# Patient Record
Sex: Female | Born: 1970 | Race: Black or African American | Hispanic: No | Smoking: Never smoker
Health system: Southern US, Community
[De-identification: ages and names within clinical notes are randomized; demographics above are authoritative.]

## PROBLEM LIST (undated history)

## (undated) DIAGNOSIS — T7840XA Allergy, unspecified, initial encounter: Secondary | ICD-10-CM

## (undated) DIAGNOSIS — E119 Type 2 diabetes mellitus without complications: Secondary | ICD-10-CM

## (undated) DIAGNOSIS — M1990 Unspecified osteoarthritis, unspecified site: Secondary | ICD-10-CM

## (undated) DIAGNOSIS — I10 Essential (primary) hypertension: Secondary | ICD-10-CM

## (undated) DIAGNOSIS — IMO0002 Reserved for concepts with insufficient information to code with codable children: Secondary | ICD-10-CM

## (undated) DIAGNOSIS — F329 Major depressive disorder, single episode, unspecified: Secondary | ICD-10-CM

## (undated) DIAGNOSIS — G43909 Migraine, unspecified, not intractable, without status migrainosus: Secondary | ICD-10-CM

## (undated) DIAGNOSIS — F32A Depression, unspecified: Secondary | ICD-10-CM

## (undated) HISTORY — DX: Depression, unspecified: F32.A

## (undated) HISTORY — DX: Migraine, unspecified, not intractable, without status migrainosus: G43.909

## (undated) HISTORY — DX: Major depressive disorder, single episode, unspecified: F32.9

## (undated) HISTORY — DX: Unspecified osteoarthritis, unspecified site: M19.90

## (undated) HISTORY — DX: Allergy, unspecified, initial encounter: T78.40XA

## (undated) HISTORY — DX: Reserved for concepts with insufficient information to code with codable children: IMO0002

---
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
## 1997-10-13 ENCOUNTER — Emergency Department (HOSPITAL_COMMUNITY): Admission: EM | Admit: 1997-10-13 | Discharge: 1997-10-13 | Payer: Self-pay | Admitting: Emergency Medicine

## 1998-04-24 ENCOUNTER — Emergency Department (HOSPITAL_COMMUNITY): Admission: EM | Admit: 1998-04-24 | Discharge: 1998-04-24 | Payer: Self-pay | Admitting: Emergency Medicine

## 1998-08-23 ENCOUNTER — Emergency Department (HOSPITAL_COMMUNITY): Admission: EM | Admit: 1998-08-23 | Discharge: 1998-08-23 | Payer: Self-pay | Admitting: Emergency Medicine

## 1999-01-01 ENCOUNTER — Emergency Department (HOSPITAL_COMMUNITY): Admission: EM | Admit: 1999-01-01 | Discharge: 1999-01-01 | Payer: Self-pay | Admitting: Emergency Medicine

## 1999-01-30 ENCOUNTER — Emergency Department (HOSPITAL_COMMUNITY): Admission: EM | Admit: 1999-01-30 | Discharge: 1999-01-30 | Payer: Self-pay

## 1999-04-12 ENCOUNTER — Emergency Department (HOSPITAL_COMMUNITY): Admission: EM | Admit: 1999-04-12 | Discharge: 1999-04-12 | Payer: Self-pay | Admitting: Emergency Medicine

## 1999-04-15 ENCOUNTER — Emergency Department (HOSPITAL_COMMUNITY): Admission: EM | Admit: 1999-04-15 | Discharge: 1999-04-15 | Payer: Self-pay | Admitting: Emergency Medicine

## 1999-05-02 ENCOUNTER — Emergency Department (HOSPITAL_COMMUNITY): Admission: EM | Admit: 1999-05-02 | Discharge: 1999-05-02 | Payer: Self-pay

## 1999-06-04 ENCOUNTER — Emergency Department (HOSPITAL_COMMUNITY): Admission: EM | Admit: 1999-06-04 | Discharge: 1999-06-04 | Payer: Self-pay | Admitting: Emergency Medicine

## 1999-06-04 ENCOUNTER — Encounter: Payer: Self-pay | Admitting: Emergency Medicine

## 1999-08-17 ENCOUNTER — Emergency Department (HOSPITAL_COMMUNITY): Admission: EM | Admit: 1999-08-17 | Discharge: 1999-08-17 | Payer: Self-pay | Admitting: Emergency Medicine

## 1999-09-07 ENCOUNTER — Emergency Department (HOSPITAL_COMMUNITY): Admission: EM | Admit: 1999-09-07 | Discharge: 1999-09-07 | Payer: Self-pay | Admitting: *Deleted

## 1999-11-26 ENCOUNTER — Encounter: Payer: Self-pay | Admitting: Emergency Medicine

## 1999-11-26 ENCOUNTER — Emergency Department (HOSPITAL_COMMUNITY): Admission: EM | Admit: 1999-11-26 | Discharge: 1999-11-26 | Payer: Self-pay | Admitting: *Deleted

## 2000-01-03 ENCOUNTER — Emergency Department (HOSPITAL_COMMUNITY): Admission: EM | Admit: 2000-01-03 | Discharge: 2000-01-04 | Payer: Self-pay | Admitting: Emergency Medicine

## 2000-01-03 ENCOUNTER — Encounter: Payer: Self-pay | Admitting: Emergency Medicine

## 2000-03-03 ENCOUNTER — Emergency Department (HOSPITAL_COMMUNITY): Admission: EM | Admit: 2000-03-03 | Discharge: 2000-03-03 | Payer: Self-pay | Admitting: Emergency Medicine

## 2000-06-21 ENCOUNTER — Emergency Department (HOSPITAL_COMMUNITY): Admission: EM | Admit: 2000-06-21 | Discharge: 2000-06-21 | Payer: Self-pay | Admitting: Emergency Medicine

## 2000-10-04 ENCOUNTER — Emergency Department (HOSPITAL_COMMUNITY): Admission: EM | Admit: 2000-10-04 | Discharge: 2000-10-04 | Payer: Self-pay | Admitting: Emergency Medicine

## 2000-10-09 ENCOUNTER — Emergency Department (HOSPITAL_COMMUNITY): Admission: EM | Admit: 2000-10-09 | Discharge: 2000-10-09 | Payer: Self-pay | Admitting: Emergency Medicine

## 2000-10-09 ENCOUNTER — Encounter: Payer: Self-pay | Admitting: Emergency Medicine

## 2000-10-29 ENCOUNTER — Emergency Department (HOSPITAL_COMMUNITY): Admission: EM | Admit: 2000-10-29 | Discharge: 2000-10-29 | Payer: Self-pay | Admitting: Emergency Medicine

## 2001-01-20 ENCOUNTER — Emergency Department (HOSPITAL_COMMUNITY): Admission: EM | Admit: 2001-01-20 | Discharge: 2001-01-20 | Payer: Self-pay | Admitting: Emergency Medicine

## 2001-03-23 ENCOUNTER — Emergency Department (HOSPITAL_COMMUNITY): Admission: EM | Admit: 2001-03-23 | Discharge: 2001-03-23 | Payer: Self-pay | Admitting: *Deleted

## 2001-04-10 ENCOUNTER — Emergency Department (HOSPITAL_COMMUNITY): Admission: EM | Admit: 2001-04-10 | Discharge: 2001-04-10 | Payer: Self-pay | Admitting: Emergency Medicine

## 2001-04-10 ENCOUNTER — Encounter: Payer: Self-pay | Admitting: Emergency Medicine

## 2001-05-21 ENCOUNTER — Emergency Department (HOSPITAL_COMMUNITY): Admission: EM | Admit: 2001-05-21 | Discharge: 2001-05-22 | Payer: Self-pay | Admitting: Emergency Medicine

## 2001-06-08 ENCOUNTER — Emergency Department (HOSPITAL_COMMUNITY): Admission: EM | Admit: 2001-06-08 | Discharge: 2001-06-08 | Payer: Self-pay | Admitting: Emergency Medicine

## 2001-08-09 ENCOUNTER — Emergency Department (HOSPITAL_COMMUNITY): Admission: EM | Admit: 2001-08-09 | Discharge: 2001-08-09 | Payer: Self-pay | Admitting: Emergency Medicine

## 2001-10-06 ENCOUNTER — Emergency Department (HOSPITAL_COMMUNITY): Admission: EM | Admit: 2001-10-06 | Discharge: 2001-10-06 | Payer: Self-pay | Admitting: Unknown Physician Specialty

## 2001-10-06 ENCOUNTER — Encounter: Payer: Self-pay | Admitting: Emergency Medicine

## 2001-12-21 ENCOUNTER — Emergency Department (HOSPITAL_COMMUNITY): Admission: EM | Admit: 2001-12-21 | Discharge: 2001-12-21 | Payer: Self-pay | Admitting: Emergency Medicine

## 2002-03-15 ENCOUNTER — Emergency Department (HOSPITAL_COMMUNITY): Admission: EM | Admit: 2002-03-15 | Discharge: 2002-03-15 | Payer: Self-pay | Admitting: Emergency Medicine

## 2002-10-01 ENCOUNTER — Encounter: Payer: Self-pay | Admitting: Emergency Medicine

## 2002-10-01 ENCOUNTER — Emergency Department (HOSPITAL_COMMUNITY): Admission: EM | Admit: 2002-10-01 | Discharge: 2002-10-01 | Payer: Self-pay | Admitting: Emergency Medicine

## 2002-11-08 ENCOUNTER — Encounter: Admission: RE | Admit: 2002-11-08 | Discharge: 2003-02-06 | Payer: Self-pay | Admitting: *Deleted

## 2003-01-10 ENCOUNTER — Emergency Department (HOSPITAL_COMMUNITY): Admission: EM | Admit: 2003-01-10 | Discharge: 2003-01-10 | Payer: Self-pay | Admitting: Emergency Medicine

## 2003-01-28 LAB — HM PAP SMEAR

## 2003-07-26 ENCOUNTER — Emergency Department (HOSPITAL_COMMUNITY): Admission: EM | Admit: 2003-07-26 | Discharge: 2003-07-26 | Payer: Self-pay | Admitting: Emergency Medicine

## 2003-08-08 ENCOUNTER — Emergency Department (HOSPITAL_COMMUNITY): Admission: EM | Admit: 2003-08-08 | Discharge: 2003-08-08 | Payer: Self-pay | Admitting: Emergency Medicine

## 2003-10-04 ENCOUNTER — Emergency Department (HOSPITAL_COMMUNITY): Admission: EM | Admit: 2003-10-04 | Discharge: 2003-10-04 | Payer: Self-pay | Admitting: Emergency Medicine

## 2004-01-01 ENCOUNTER — Emergency Department (HOSPITAL_COMMUNITY): Admission: EM | Admit: 2004-01-01 | Discharge: 2004-01-01 | Payer: Self-pay | Admitting: Emergency Medicine

## 2004-01-10 ENCOUNTER — Emergency Department (HOSPITAL_COMMUNITY): Admission: EM | Admit: 2004-01-10 | Discharge: 2004-01-10 | Payer: Self-pay | Admitting: Emergency Medicine

## 2004-04-03 ENCOUNTER — Emergency Department (HOSPITAL_COMMUNITY): Admission: EM | Admit: 2004-04-03 | Discharge: 2004-04-03 | Payer: Self-pay | Admitting: Emergency Medicine

## 2004-07-15 ENCOUNTER — Emergency Department (HOSPITAL_COMMUNITY): Admission: EM | Admit: 2004-07-15 | Discharge: 2004-07-15 | Payer: Self-pay | Admitting: Emergency Medicine

## 2004-08-15 ENCOUNTER — Emergency Department (HOSPITAL_COMMUNITY): Admission: EM | Admit: 2004-08-15 | Discharge: 2004-08-15 | Payer: Self-pay | Admitting: Emergency Medicine

## 2004-11-25 ENCOUNTER — Emergency Department (HOSPITAL_COMMUNITY): Admission: EM | Admit: 2004-11-25 | Discharge: 2004-11-25 | Payer: Self-pay | Admitting: *Deleted

## 2005-01-27 LAB — HM DIABETES EYE EXAM

## 2005-02-18 ENCOUNTER — Emergency Department (HOSPITAL_COMMUNITY): Admission: EM | Admit: 2005-02-18 | Discharge: 2005-02-18 | Payer: Self-pay | Admitting: Emergency Medicine

## 2005-02-24 ENCOUNTER — Emergency Department (HOSPITAL_COMMUNITY): Admission: EM | Admit: 2005-02-24 | Discharge: 2005-02-24 | Payer: Self-pay | Admitting: Emergency Medicine

## 2005-06-19 ENCOUNTER — Emergency Department (HOSPITAL_COMMUNITY): Admission: EM | Admit: 2005-06-19 | Discharge: 2005-06-19 | Payer: Self-pay | Admitting: Emergency Medicine

## 2005-08-31 ENCOUNTER — Emergency Department (HOSPITAL_COMMUNITY): Admission: EM | Admit: 2005-08-31 | Discharge: 2005-08-31 | Payer: Self-pay | Admitting: Emergency Medicine

## 2006-03-19 ENCOUNTER — Emergency Department (HOSPITAL_COMMUNITY): Admission: EM | Admit: 2006-03-19 | Discharge: 2006-03-19 | Payer: Self-pay | Admitting: Emergency Medicine

## 2007-06-08 ENCOUNTER — Emergency Department (HOSPITAL_COMMUNITY): Admission: EM | Admit: 2007-06-08 | Discharge: 2007-06-08 | Payer: Self-pay | Admitting: Emergency Medicine

## 2007-10-15 ENCOUNTER — Emergency Department (HOSPITAL_COMMUNITY): Admission: EM | Admit: 2007-10-15 | Discharge: 2007-10-15 | Payer: Self-pay | Admitting: Emergency Medicine

## 2008-01-18 ENCOUNTER — Emergency Department (HOSPITAL_COMMUNITY): Admission: EM | Admit: 2008-01-18 | Discharge: 2008-01-18 | Payer: Self-pay | Admitting: Emergency Medicine

## 2008-01-20 ENCOUNTER — Emergency Department (HOSPITAL_COMMUNITY): Admission: EM | Admit: 2008-01-20 | Discharge: 2008-01-21 | Payer: Self-pay | Admitting: Emergency Medicine

## 2008-02-14 ENCOUNTER — Emergency Department (HOSPITAL_COMMUNITY): Admission: EM | Admit: 2008-02-14 | Discharge: 2008-02-14 | Payer: Self-pay | Admitting: Emergency Medicine

## 2008-03-13 ENCOUNTER — Emergency Department (HOSPITAL_COMMUNITY): Admission: EM | Admit: 2008-03-13 | Discharge: 2008-03-13 | Payer: Self-pay | Admitting: Emergency Medicine

## 2008-04-06 ENCOUNTER — Emergency Department (HOSPITAL_COMMUNITY): Admission: EM | Admit: 2008-04-06 | Discharge: 2008-04-06 | Payer: Self-pay | Admitting: Emergency Medicine

## 2008-06-14 ENCOUNTER — Emergency Department (HOSPITAL_COMMUNITY): Admission: EM | Admit: 2008-06-14 | Discharge: 2008-06-14 | Payer: Self-pay | Admitting: Emergency Medicine

## 2008-07-27 ENCOUNTER — Emergency Department (HOSPITAL_COMMUNITY): Admission: EM | Admit: 2008-07-27 | Discharge: 2008-07-27 | Payer: Self-pay | Admitting: Emergency Medicine

## 2008-08-16 ENCOUNTER — Emergency Department (HOSPITAL_COMMUNITY): Admission: EM | Admit: 2008-08-16 | Discharge: 2008-08-17 | Payer: Self-pay | Admitting: Emergency Medicine

## 2008-12-06 ENCOUNTER — Encounter: Admission: RE | Admit: 2008-12-06 | Discharge: 2009-01-24 | Payer: Self-pay | Admitting: Internal Medicine

## 2009-04-05 ENCOUNTER — Emergency Department (HOSPITAL_COMMUNITY): Admission: EM | Admit: 2009-04-05 | Discharge: 2009-04-05 | Payer: Self-pay | Admitting: Emergency Medicine

## 2009-05-24 ENCOUNTER — Ambulatory Visit: Payer: Self-pay | Admitting: Family Medicine

## 2009-05-24 ENCOUNTER — Observation Stay (HOSPITAL_COMMUNITY): Admission: EM | Admit: 2009-05-24 | Discharge: 2009-05-25 | Payer: Self-pay | Admitting: Emergency Medicine

## 2009-05-24 ENCOUNTER — Encounter: Payer: Self-pay | Admitting: Family Medicine

## 2009-05-24 ENCOUNTER — Ambulatory Visit: Payer: Self-pay | Admitting: Vascular Surgery

## 2009-08-27 ENCOUNTER — Emergency Department (HOSPITAL_COMMUNITY): Admission: EM | Admit: 2009-08-27 | Discharge: 2009-08-27 | Payer: Self-pay | Admitting: Emergency Medicine

## 2010-04-16 LAB — POCT CARDIAC MARKERS
CKMB, poc: <1 ng/mL — ABNORMAL LOW (ref 1.0–8.0)
Myoglobin, poc: 90.6 ng/mL (ref 12–200)
Troponin i, poc: <0.05 ng/mL (ref 0.00–0.09)

## 2010-04-16 LAB — BASIC METABOLIC PANEL
BUN: 7 mg/dL (ref 6–23)
CO2: 23 mEq/L (ref 19–32)
CO2: 24 mEq/L (ref 19–32)
Calcium: 8.7 mg/dL (ref 8.4–10.5)
Chloride: 109 mEq/L (ref 96–112)
GFR calc Af Amer: >60 mL/min (ref >60)
Glucose, Bld: 102 mg/dL — ABNORMAL HIGH (ref 70–99)
Potassium: 3.5 mEq/L (ref 3.5–5.1)
Sodium: 137 mEq/L (ref 135–145)

## 2010-04-16 LAB — CBC
HCT: 31.9 % — ABNORMAL LOW (ref 36.0–46.0)
MCHC: 34.5 g/dL (ref 30.0–36.0)
MCV: 75.9 fL — ABNORMAL LOW (ref 78.0–100.0)
MCV: 76.2 fL — ABNORMAL LOW (ref 78.0–100.0)
Platelets: 248 10*3/uL (ref 150–400)
Platelets: 271 10*3/uL (ref 150–400)
RBC: 4.38 MIL/uL (ref 3.87–5.11)
RDW: 16.8 % — ABNORMAL HIGH (ref 11.5–15.5)
RDW: 17.1 % — ABNORMAL HIGH (ref 11.5–15.5)

## 2010-04-16 LAB — CARDIAC PANEL(CRET KIN+CKTOT+MB+TROPI)
CK, MB: 0.7 ng/mL (ref 0.3–4.0)
Relative Index: 0.7 (ref 0.0–2.5)
Total CK: 105 U/L (ref 7–177)
Total CK: 107 U/L (ref 7–177)
Troponin I: 0.01 ng/mL (ref 0.00–0.06)

## 2010-04-16 LAB — LIPID PANEL
Cholesterol: 210 mg/dL — ABNORMAL HIGH (ref 0–200)
LDL Cholesterol: 143 mg/dL — ABNORMAL HIGH (ref 0–99)
Total CHOL/HDL Ratio: 4.4 RATIO

## 2010-04-16 LAB — DIFFERENTIAL
Basophils Absolute: 0.1 10*3/uL (ref 0.0–0.1)
Eosinophils Absolute: 0.1 10*3/uL (ref 0.0–0.7)
Eosinophils Relative: 2 % (ref 0–5)
Monocytes Absolute: 0.2 10*3/uL (ref 0.1–1.0)

## 2010-04-16 LAB — CK TOTAL AND CKMB (NOT AT ARMC)
CK, MB: 0.8 ng/mL (ref 0.3–4.0)
Relative Index: 0.7 (ref 0.0–2.5)

## 2010-04-16 LAB — POCT PREGNANCY, URINE: Preg Test, Ur: NEGATIVE

## 2010-04-22 LAB — URINALYSIS, ROUTINE W REFLEX MICROSCOPIC
Nitrite: NEGATIVE
Protein, ur: NEGATIVE mg/dL
Specific Gravity, Urine: 1.034 — ABNORMAL HIGH (ref 1.005–1.030)
Urobilinogen, UA: 0.2 mg/dL (ref 0.0–1.0)

## 2010-04-22 LAB — POCT PREGNANCY, URINE: Preg Test, Ur: NEGATIVE

## 2010-04-22 LAB — GLUCOSE, CAPILLARY: Glucose-Capillary: 82 mg/dL (ref 70–99)

## 2010-04-27 ENCOUNTER — Emergency Department (HOSPITAL_COMMUNITY)
Admission: EM | Admit: 2010-04-27 | Discharge: 2010-04-27 | Disposition: A | Discharge status: Home / Self Care | Payer: Self-pay | Attending: Emergency Medicine | Admitting: Emergency Medicine

## 2010-04-27 DIAGNOSIS — R112 Nausea with vomiting, unspecified: Secondary | ICD-10-CM | POA: Insufficient documentation

## 2010-04-27 DIAGNOSIS — R10816 Epigastric abdominal tenderness: Secondary | ICD-10-CM | POA: Insufficient documentation

## 2010-04-27 DIAGNOSIS — I10 Essential (primary) hypertension: Secondary | ICD-10-CM | POA: Insufficient documentation

## 2010-04-27 DIAGNOSIS — R109 Unspecified abdominal pain: Secondary | ICD-10-CM | POA: Insufficient documentation

## 2010-04-27 DIAGNOSIS — E119 Type 2 diabetes mellitus without complications: Secondary | ICD-10-CM | POA: Insufficient documentation

## 2010-04-27 DIAGNOSIS — R197 Diarrhea, unspecified: Secondary | ICD-10-CM | POA: Insufficient documentation

## 2010-04-27 LAB — GLUCOSE, CAPILLARY: Glucose-Capillary: 85 mg/dL (ref 70–99)

## 2010-05-05 LAB — COMPREHENSIVE METABOLIC PANEL
ALT: 15 U/L (ref 0–35)
AST: 26 U/L (ref 0–37)
Albumin: 3.8 g/dL (ref 3.5–5.2)
Alkaline Phosphatase: 60 U/L (ref 39–117)
Chloride: 109 mEq/L (ref 96–112)
GFR calc Af Amer: >60 mL/min (ref >60)
Potassium: 3.8 mEq/L (ref 3.5–5.1)
Total Bilirubin: 0.6 mg/dL (ref 0.3–1.2)

## 2010-05-05 LAB — URINALYSIS, ROUTINE W REFLEX MICROSCOPIC
Ketones, ur: NEGATIVE mg/dL
Nitrite: NEGATIVE
Urobilinogen, UA: 0.2 mg/dL (ref 0.0–1.0)
pH: 5.5 (ref 5.0–8.0)

## 2010-05-05 LAB — DIFFERENTIAL
Basophils Absolute: 0.1 10*3/uL (ref 0.0–0.1)
Basophils Relative: 1 % (ref 0–1)
Eosinophils Relative: 2 % (ref 0–5)
Monocytes Absolute: 0.5 10*3/uL (ref 0.1–1.0)

## 2010-05-05 LAB — RAPID STREP SCREEN (MED CTR MEBANE ONLY): Streptococcus, Group A Screen (Direct): NEGATIVE

## 2010-05-05 LAB — CBC
Platelets: 324 10*3/uL (ref 150–400)
WBC: 9 10*3/uL (ref 4.0–10.5)

## 2010-05-05 LAB — GLUCOSE, CAPILLARY

## 2010-05-14 LAB — GLUCOSE, CAPILLARY: Glucose-Capillary: 71 mg/dL (ref 70–99)

## 2010-09-11 ENCOUNTER — Emergency Department (HOSPITAL_COMMUNITY)
Admission: EM | Admit: 2010-09-11 | Discharge: 2010-09-11 | Disposition: A | Discharge status: Home / Self Care | Payer: Self-pay | Attending: Emergency Medicine | Admitting: Emergency Medicine

## 2010-09-11 ENCOUNTER — Emergency Department (HOSPITAL_COMMUNITY): Payer: Self-pay

## 2010-09-11 DIAGNOSIS — X500XXA Overexertion from strenuous movement or load, initial encounter: Secondary | ICD-10-CM | POA: Insufficient documentation

## 2010-09-11 DIAGNOSIS — M25579 Pain in unspecified ankle and joints of unspecified foot: Secondary | ICD-10-CM | POA: Insufficient documentation

## 2010-09-11 DIAGNOSIS — S93409A Sprain of unspecified ligament of unspecified ankle, initial encounter: Secondary | ICD-10-CM | POA: Insufficient documentation

## 2010-11-01 LAB — CBC
HCT: 39.1 % (ref 36.0–46.0)
Hemoglobin: 12.9 g/dL (ref 12.0–15.0)
MCHC: 32.9 g/dL (ref 30.0–36.0)
MCHC: 32.9 g/dL (ref 30.0–36.0)
Platelets: 339 10*3/uL (ref 150–400)
RBC: 5.23 MIL/uL — ABNORMAL HIGH (ref 3.87–5.11)
RDW: 17.8 % — ABNORMAL HIGH (ref 11.5–15.5)
RDW: 17.8 % — ABNORMAL HIGH (ref 11.5–15.5)

## 2010-11-01 LAB — URINALYSIS, ROUTINE W REFLEX MICROSCOPIC
Bilirubin Urine: NEGATIVE
Glucose, UA: >1000 mg/dL — AB
Glucose, UA: >1000 mg/dL — AB
Hgb urine dipstick: NEGATIVE
Ketones, ur: 40 mg/dL — AB
Leukocytes, UA: NEGATIVE
Leukocytes, UA: NEGATIVE
Nitrite: NEGATIVE
Protein, ur: NEGATIVE mg/dL
Protein, ur: NEGATIVE mg/dL

## 2010-11-01 LAB — BASIC METABOLIC PANEL
BUN: 9 mg/dL (ref 6–23)
CO2: 20 mEq/L (ref 19–32)
Calcium: 10 mg/dL (ref 8.4–10.5)
Creatinine, Ser: 0.95 mg/dL (ref 0.4–1.2)
GFR calc Af Amer: >60 mL/min (ref >60)
Glucose, Bld: 499 mg/dL — ABNORMAL HIGH (ref 70–99)

## 2010-11-01 LAB — DIFFERENTIAL
Basophils Absolute: 0.1 10*3/uL (ref 0.0–0.1)
Basophils Relative: 1 % (ref 0–1)
Basophils Relative: 2 % — ABNORMAL HIGH (ref 0–1)
Eosinophils Absolute: 0.1 10*3/uL (ref 0.0–0.7)
Eosinophils Absolute: 0.2 10*3/uL (ref 0.0–0.7)
Monocytes Relative: 8 % (ref 3–12)
Neutro Abs: 4.6 10*3/uL (ref 1.7–7.7)
Neutro Abs: 4.8 10*3/uL (ref 1.7–7.7)
Neutrophils Relative %: 58 % (ref 43–77)
Neutrophils Relative %: 61 % (ref 43–77)

## 2010-11-01 LAB — POCT PREGNANCY, URINE: Preg Test, Ur: NEGATIVE

## 2010-11-01 LAB — COMPREHENSIVE METABOLIC PANEL
ALT: 16 U/L (ref 0–35)
Alkaline Phosphatase: 62 U/L (ref 39–117)
BUN: 8 mg/dL (ref 6–23)
CO2: 21 mEq/L (ref 19–32)
Calcium: 8.8 mg/dL (ref 8.4–10.5)
GFR calc non Af Amer: >60 mL/min (ref >60)
Glucose, Bld: 333 mg/dL — ABNORMAL HIGH (ref 70–99)
Potassium: 3.5 mEq/L (ref 3.5–5.1)
Total Protein: 7.1 g/dL (ref 6.0–8.3)

## 2010-11-01 LAB — URINE MICROSCOPIC-ADD ON

## 2010-11-01 LAB — GLUCOSE, CAPILLARY

## 2010-11-01 LAB — LIPASE, BLOOD: Lipase: 38 U/L (ref 11–59)

## 2010-11-01 LAB — KETONES, QUALITATIVE: Acetone, Bld: NEGATIVE

## 2011-02-12 ENCOUNTER — Ambulatory Visit: Payer: Self-pay

## 2011-02-12 DIAGNOSIS — Z23 Encounter for immunization: Secondary | ICD-10-CM

## 2011-02-28 LAB — HM DIABETES FOOT EXAM

## 2011-09-17 ENCOUNTER — Other Ambulatory Visit: Payer: Self-pay | Admitting: Family Medicine

## 2011-09-17 NOTE — Telephone Encounter (Signed)
Chart pulled [id number]

## 2011-12-11 ENCOUNTER — Emergency Department (HOSPITAL_COMMUNITY)
Admission: EM | Admit: 2011-12-11 | Discharge: 2011-12-11 | Disposition: A | Discharge status: Home / Self Care | Payer: Self-pay | Attending: Emergency Medicine | Admitting: Emergency Medicine

## 2011-12-11 ENCOUNTER — Encounter (HOSPITAL_COMMUNITY): Payer: Self-pay | Admitting: *Deleted

## 2011-12-11 DIAGNOSIS — E119 Type 2 diabetes mellitus without complications: Secondary | ICD-10-CM | POA: Insufficient documentation

## 2011-12-11 DIAGNOSIS — H6690 Otitis media, unspecified, unspecified ear: Secondary | ICD-10-CM | POA: Insufficient documentation

## 2011-12-11 DIAGNOSIS — I10 Essential (primary) hypertension: Secondary | ICD-10-CM | POA: Insufficient documentation

## 2011-12-11 DIAGNOSIS — Z79899 Other long term (current) drug therapy: Secondary | ICD-10-CM | POA: Insufficient documentation

## 2011-12-11 DIAGNOSIS — H9319 Tinnitus, unspecified ear: Secondary | ICD-10-CM | POA: Insufficient documentation

## 2011-12-11 HISTORY — DX: Essential (primary) hypertension: I10

## 2011-12-11 HISTORY — DX: Type 2 diabetes mellitus without complications: E11.9

## 2011-12-11 MED ORDER — TRAMADOL HCL 50 MG PO TABS: 50.0000 mg | ORAL_TABLET | Freq: Four times a day (QID) | ORAL | Status: DC | PRN

## 2011-12-11 MED ORDER — KETOROLAC TROMETHAMINE 60 MG/2ML IM SOLN
60.0000 mg | Freq: Once | INTRAMUSCULAR | Status: AC
  Administered 2011-12-11: 60 mg via INTRAMUSCULAR
  Filled 2011-12-11: qty 2

## 2011-12-11 MED ORDER — SULFAMETHOXAZOLE-TRIMETHOPRIM 800-160 MG PO TABS: 1.0000 | ORAL_TABLET | Freq: Two times a day (BID) | ORAL | Status: DC

## 2011-12-11 NOTE — ED Provider Notes (Signed)
History     CSN: [id number]  Arrival date & time 12/11/11  1127   First MD Initiated Contact with Patient 12/11/11 1157      No chief complaint on file.   (Consider location/radiation/quality/duration/timing/severity/associated sxs/prior treatment) HPI Comments: Patient presents with otalgia X 3 days. She states that pain is 8/10 without radiation or transmission. 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 Denies fever or chills. Denies ear drainage but reports tinnitus. Denies cough or congestion. Denies dental issues.  The history is provided by the patient. No language interpreter was used.    Past Medical History  Diagnosis Date  . Diabetes mellitus without complication   . Hypertension     Past Surgical History  Procedure Date  . Cesarean section   . Tubal ligation     History reviewed. No pertinent family history.  History  Substance Use Topics  . Smoking status: Never Smoker   . Smokeless tobacco: Never Used  . Alcohol Use: No    OB History    Grav Para Term Preterm Abortions TAB SAB Ect Mult Living                  Review of Systems  Constitutional: Negative for fever and chills.  HENT: Positive for ear pain and tinnitus. Negative for congestion, dental problem and ear discharge.   Respiratory: Negative for cough.     Allergies  Codeine; Morphine and related; Penicillins; Sudafed; Azithromycin; and Erythromycin  Home Medications   Current Outpatient Rx  Name  Route  Sig  Dispense  Refill  . ACETAMINOPHEN 500 MG PO TABS   Oral   Take 1,000 mg by mouth every 6 (six) hours as needed. Pain         . HYDROCHLOROTHIAZIDE 12.5 MG PO TABS   Oral   Take 12.5 mg by mouth daily.         Marland Kitchen LISINOPRIL 10 MG PO TABS   Oral   Take 10 mg by mouth daily.         Marland Kitchen METFORMIN HCL 500 MG PO TABS   Oral   Take 500 mg by mouth 2 (two) times daily with a meal.           BP 150/65  Pulse 65  Temp 98.6 F (37 C) (Oral)  Resp 18  SpO2 100%   LMP 11/29/2011  Physical Exam  Nursing note and vitals reviewed. Constitutional: She appears well-developed and well-nourished.  HENT:  Head: Normocephalic and atraumatic.  Left Ear: External ear normal.  Ears:  Mouth/Throat: Oropharynx is clear and moist.  Eyes: Conjunctivae normal and EOM are normal. No scleral icterus.  Neck: Normal range of motion. Neck supple.  Cardiovascular: Normal rate, regular rhythm and normal heart sounds.   Pulmonary/Chest: Effort normal and breath sounds normal.  Abdominal: Soft. Bowel sounds are normal. There is no tenderness.  Lymphadenopathy:    She has no cervical adenopathy.  Neurological: She is alert.  Skin: Skin is warm and dry.    ED Course  Procedures (including critical care time)  Labs Reviewed - No data to display No results found.   1. Otitis media       MDM  Patient presented with complaints of otalgia. Given pain medication with improvement. Discharged on ABX for otitis media. Return precautions given. No red flags for TM perforation.         Pixie Casino, PA-C 12/11/11 1240

## 2011-12-11 NOTE — ED Notes (Signed)
Pt states for the past week she's been having R ear pain, the past 2 days the pain has become severe, Pt denies drainage from ear, states last night when she bent over she did hear ringing, pt states it hurts to lay on the R side. Pt states she has been putting drops in ear but has not helped.

## 2011-12-12 NOTE — ED Provider Notes (Signed)
Medical screening examination/treatment/procedure(s) were performed by non-physician practitioner and as supervising physician I was immediately available for consultation/collaboration.   Gwyneth Sprout, MD 12/12/11 [phone]

## 2012-01-19 ENCOUNTER — Other Ambulatory Visit: Payer: Self-pay | Admitting: Family Medicine

## 2012-01-20 NOTE — Telephone Encounter (Signed)
Please pull paper chart.  

## 2012-01-20 NOTE — Telephone Encounter (Signed)
Chart pulled to PA pool at nurses station [phone]

## 2012-01-22 ENCOUNTER — Other Ambulatory Visit: Payer: Self-pay | Admitting: Family Medicine

## 2012-02-18 ENCOUNTER — Emergency Department (HOSPITAL_COMMUNITY)
Admission: EM | Admit: 2012-02-18 | Discharge: 2012-02-18 | Disposition: A | Discharge status: Home / Self Care | Payer: Self-pay | Attending: Emergency Medicine | Admitting: Emergency Medicine

## 2012-02-18 ENCOUNTER — Encounter (HOSPITAL_COMMUNITY): Payer: Self-pay | Admitting: *Deleted

## 2012-02-18 DIAGNOSIS — I10 Essential (primary) hypertension: Secondary | ICD-10-CM | POA: Insufficient documentation

## 2012-02-18 DIAGNOSIS — R112 Nausea with vomiting, unspecified: Secondary | ICD-10-CM | POA: Insufficient documentation

## 2012-02-18 DIAGNOSIS — R509 Fever, unspecified: Secondary | ICD-10-CM | POA: Insufficient documentation

## 2012-02-18 DIAGNOSIS — R05 Cough: Secondary | ICD-10-CM | POA: Insufficient documentation

## 2012-02-18 DIAGNOSIS — IMO0001 Reserved for inherently not codable concepts without codable children: Secondary | ICD-10-CM | POA: Insufficient documentation

## 2012-02-18 DIAGNOSIS — J3489 Other specified disorders of nose and nasal sinuses: Secondary | ICD-10-CM | POA: Insufficient documentation

## 2012-02-18 DIAGNOSIS — R059 Cough, unspecified: Secondary | ICD-10-CM | POA: Insufficient documentation

## 2012-02-18 DIAGNOSIS — R51 Headache: Secondary | ICD-10-CM | POA: Insufficient documentation

## 2012-02-18 DIAGNOSIS — Z79899 Other long term (current) drug therapy: Secondary | ICD-10-CM | POA: Insufficient documentation

## 2012-02-18 DIAGNOSIS — R52 Pain, unspecified: Secondary | ICD-10-CM | POA: Insufficient documentation

## 2012-02-18 DIAGNOSIS — E119 Type 2 diabetes mellitus without complications: Secondary | ICD-10-CM | POA: Insufficient documentation

## 2012-02-18 MED ORDER — METOCLOPRAMIDE HCL 10 MG PO TABS: 10.0000 mg | ORAL_TABLET | Freq: Three times a day (TID) | ORAL | Status: DC | PRN

## 2012-02-18 MED ORDER — SULFAMETHOXAZOLE-TRIMETHOPRIM 800-160 MG PO TABS: 1.0000 | ORAL_TABLET | Freq: Two times a day (BID) | ORAL | Status: DC

## 2012-02-18 MED ORDER — GUAIFENESIN 100 MG/5ML PO SYRP: 100.0000 mg | ORAL_SOLUTION | ORAL | Status: DC | PRN

## 2012-02-18 NOTE — ED Notes (Signed)
Pt states Sunday started having a sore throat, yesterday developed body aches and a headache, today became nauseous, vomited x 1 earlier today.

## 2012-02-18 NOTE — ED Provider Notes (Signed)
History   This chart was scribed for non-physician practitioner working with Loren Racer, MD by Gerlean Ren, ED Scribe. This patient was seen in room WTR7/WTR7 and the patient's care was started at 7:37 PM.    CSN: [id number]  Arrival date & time 02/18/12  1541   First MD Initiated Contact with Patient 02/18/12 1744      Chief Complaint  Patient presents with  . Sore Throat  . Generalized Body Aches  . Nausea  . Headache     The history is provided by the patient. No language interpreter was used.  Lynn Fields is a 42 y.o. female with h/o DM and HTN who presents to the Emergency Department complaining of a sore throat with gradual onset 01/19 that has been gradually worsening with associated fever as high as 102, cough producing yellow phlegm, nausea and one episode of non-bilious emesis with small streak of blood earlier today.  Pt also reports myalgias were present from 01/19-01/21 but have not been present today.  Pt works at a daycare and states flu, cold, and stomach virus have been going around.  Pt used tylenol but was unable to keep it down.  Pt denies any abdominal pain, chest pain. Pt denies frequent h/o bronchitis or pneumonia.  Pt denies tobacco and alcohol use.   Past Medical History  Diagnosis Date  . Diabetes mellitus without complication   . Hypertension     Past Surgical History  Procedure Date  . Cesarean section   . Tubal ligation     History reviewed. No pertinent family history.  History  Substance Use Topics  . Smoking status: Never Smoker   . Smokeless tobacco: Never Used  . Alcohol Use: No    No OB history provided.   Review of Systems  Constitutional: Positive for fever.       Pt states fever went as high as 102  HENT: Positive for sore throat, rhinorrhea and sinus pressure. Negative for neck pain and neck stiffness.   Eyes: Negative for visual disturbance.  Respiratory: Positive for cough. Negative for shortness of breath.    Productive  Cardiovascular: Negative for chest pain.  Gastrointestinal: Positive for nausea and vomiting. Negative for abdominal pain, diarrhea and constipation.  Genitourinary: Negative for dysuria.  Musculoskeletal: Positive for myalgias.  Skin: Negative for rash.  Neurological: Positive for headaches. Negative for dizziness and numbness.    Allergies  Codeine; Morphine and related; Penicillins; Sudafed; Azithromycin; and Erythromycin  Home Medications   Current Outpatient Rx  Name  Route  Sig  Dispense  Refill  . GUAIFENESIN 100 MG/5ML PO SYRP   Oral   Take 200 mg by mouth 3 (three) times daily as needed. Cough         . LISINOPRIL-HYDROCHLOROTHIAZIDE 10-12.5 MG PO TABS   Oral   Take 1 tablet by mouth daily.         Marland Kitchen METFORMIN HCL 500 MG PO TABS   Oral   Take 500 mg by mouth 2 (two) times daily with a meal.           BP 134/88  Pulse 73  Temp 98.7 F (37.1 C) (Oral)  Resp 18  SpO2 100%  LMP 02/13/2012  Physical Exam  Nursing note and vitals reviewed. Constitutional: She is oriented to person, place, and time. She appears well-developed and well-nourished. No distress.  HENT:  Head: Normocephalic and atraumatic.  Nose: Right sinus exhibits maxillary sinus tenderness and frontal sinus tenderness. Left sinus exhibits  maxillary sinus tenderness and frontal sinus tenderness.  Eyes: EOM are normal.  Neck: Normal range of motion. Neck supple.  Cardiovascular: Normal rate, regular rhythm and normal heart sounds.   No murmur heard. Pulmonary/Chest: Effort normal and breath sounds normal. No respiratory distress. She has no wheezes.  Abdominal: Soft. Bowel sounds are normal. She exhibits no distension. There is no tenderness.  Musculoskeletal: Normal range of motion.  Neurological: She is alert and oriented to person, place, and time.  Skin: Skin is warm and dry.  Psychiatric: She has a normal mood and affect. Her behavior is normal.    ED Course  Procedures  (including critical care time) DIAGNOSTIC STUDIES: Oxygen Saturation is 100% on room air, normal by my interpretation.    COORDINATION OF CARE: 7:42 PM- Patient informed of clinical course, understands medical decision-making process, and agrees with plan.    Diagnosis: cough with sputum    MDM  Mild to moderate symptoms of clear/yellow nasal discharge/congestion and scratchy throat with productive cough for less than 10 days.  Considered viral vs. Bacterial infection.  Pt advocated for antibiotic treatment. Advised pt of possibility of viral etiology. Last antibiotic intake was 3 months ago for otitis media and then over a year prior to that. Advised patient of symptomatic treatment for warm saline nasal washes and prescribed antibiotic (see multiple pt allergies), cough syrup, and reglan.  Recommendations for follow-up with primary care physician.      Glade Nurse, PA-C 02/19/12 1430

## 2012-02-19 NOTE — ED Provider Notes (Signed)
Medical screening examination/treatment/procedure(s) were performed by non-physician practitioner and as supervising physician I was immediately available for consultation/collaboration.   Loren Racer, MD 02/19/12 1501

## 2012-03-06 ENCOUNTER — Emergency Department (HOSPITAL_COMMUNITY)
Admission: EM | Admit: 2012-03-06 | Discharge: 2012-03-06 | Disposition: A | Discharge status: Home / Self Care | Payer: Self-pay | Attending: Emergency Medicine | Admitting: Emergency Medicine

## 2012-03-06 ENCOUNTER — Encounter (HOSPITAL_COMMUNITY): Payer: Self-pay | Admitting: Emergency Medicine

## 2012-03-06 DIAGNOSIS — Z79899 Other long term (current) drug therapy: Secondary | ICD-10-CM | POA: Insufficient documentation

## 2012-03-06 DIAGNOSIS — Y929 Unspecified place or not applicable: Secondary | ICD-10-CM | POA: Insufficient documentation

## 2012-03-06 DIAGNOSIS — W240XXA Contact with lifting devices, not elsewhere classified, initial encounter: Secondary | ICD-10-CM | POA: Insufficient documentation

## 2012-03-06 DIAGNOSIS — Y939 Activity, unspecified: Secondary | ICD-10-CM | POA: Insufficient documentation

## 2012-03-06 DIAGNOSIS — I10 Essential (primary) hypertension: Secondary | ICD-10-CM | POA: Insufficient documentation

## 2012-03-06 DIAGNOSIS — E119 Type 2 diabetes mellitus without complications: Secondary | ICD-10-CM | POA: Insufficient documentation

## 2012-03-06 DIAGNOSIS — S63509A Unspecified sprain of unspecified wrist, initial encounter: Secondary | ICD-10-CM | POA: Insufficient documentation

## 2012-03-06 MED ORDER — TRAMADOL HCL 50 MG PO TABS: 50.0000 mg | ORAL_TABLET | Freq: Four times a day (QID) | ORAL | Status: DC | PRN

## 2012-03-06 MED ORDER — TRAMADOL HCL 50 MG PO TABS
50.0000 mg | ORAL_TABLET | Freq: Once | ORAL | Status: AC
  Administered 2012-03-06: 50 mg via ORAL
  Filled 2012-03-06: qty 1

## 2012-03-06 NOTE — ED Notes (Signed)
Pt c/o right wrist pain and swelling after injury while moving last Friday 02/27/2012.

## 2012-03-06 NOTE — ED Provider Notes (Signed)
Medical screening examination/treatment/procedure(s) were performed by non-physician practitioner and as supervising physician I was immediately available for consultation/collaboration.  Doug Sou, MD 03/06/12 [phone]

## 2012-03-06 NOTE — ED Provider Notes (Signed)
History     CSN: [id number]  Arrival date & time 03/06/12  0858   First MD Initiated Contact with Patient 03/06/12 [phone]      Chief Complaint  Patient presents with  . Wrist Injury    (Consider location/radiation/quality/duration/timing/severity/associated sxs/prior treatment) HPI Comments: Patient presents with complaint of right wrist pain that started 8 days ago after lifting a heavy piece of furniture. Patient states that she has been able to move her wrist but it has been sore. She's been taking Tylenol with some relief. She denies elbow or shoulder pain. No weakness, numbness, or tingling. Onset of symptoms acute. Course is constant.  The history is provided by the patient.    Past Medical History  Diagnosis Date  . Diabetes mellitus without complication   . Hypertension     Past Surgical History  Procedure Laterality Date  . Cesarean section    . Tubal ligation      History reviewed. No pertinent family history.  History  Substance Use Topics  . Smoking status: Never Smoker   . Smokeless tobacco: Never Used  . Alcohol Use: No    OB History   Grav Para Term Preterm Abortions TAB SAB Ect Mult Living                  Review of Systems  Constitutional: Negative for activity change.  HENT: Negative for neck pain.   Musculoskeletal: Positive for arthralgias. Negative for back pain and joint swelling.  Skin: Negative for wound.  Neurological: Negative for weakness and numbness.    Allergies  Codeine; Morphine and related; Penicillins; Sudafed; Azithromycin; and Erythromycin  Home Medications   Current Outpatient Rx  Name  Route  Sig  Dispense  Refill  . acetaminophen (TYLENOL) 500 MG tablet   Oral   Take 1,000 mg by mouth every 6 (six) hours as needed for pain.         Marland Kitchen guaifenesin (ROBITUSSIN) 100 MG/5ML syrup   Oral   Take 5-10 mLs (100-200 mg total) by mouth every 4 (four) hours as needed for cough.   60 mL   0   .  lisinopril-hydrochlorothiazide (PRINZIDE,ZESTORETIC) 10-12.5 MG per tablet   Oral   Take 1 tablet by mouth daily before breakfast.          . metFORMIN (GLUCOPHAGE) 500 MG tablet   Oral   Take 500 mg by mouth 2 (two) times daily with a meal.           BP 151/94  Pulse 70  Temp(Src) 98.9 F (37.2 C) (Oral)  Resp 16  Ht 5' (1.524 m)  SpO2 99%  LMP 02/13/2012  Physical Exam  Nursing note and vitals reviewed. Constitutional: She appears well-developed and well-nourished.  HENT:  Head: Normocephalic and atraumatic.  Eyes: Pupils are equal, round, and reactive to light.  Neck: Normal range of motion. Neck supple.  Cardiovascular: Exam reveals no decreased pulses.   Pulses:      Radial pulses are 2+ on the right side, and 2+ on the left side.  Musculoskeletal: She exhibits tenderness. She exhibits no edema.       Right shoulder: Normal.       Right elbow: Normal.      Right wrist: She exhibits tenderness (generalized, point tenderness). She exhibits normal range of motion and no bony tenderness.       Right hand: She exhibits normal capillary refill. Decreased sensation is not present in the ulnar distribution, is not  present in the medial distribution and is not present in the radial distribution. She exhibits no finger abduction, no thumb/finger opposition and no wrist extension trouble.  Neurological: She is alert. No sensory deficit.  Motor, sensation, and vascular distal to the injury is fully intact.   Skin: Skin is warm and dry.  Psychiatric: She has a normal mood and affect.    ED Course  Procedures (including critical care time)  Labs Reviewed - No data to display No results found.   1. Wrist sprain     9:32 AM Patient seen and examined.    Vital signs reviewed and are as follows: Filed Vitals:   03/06/12 0914  BP: 151/94  Pulse: 70  Temp: 98.9 F (37.2 C)  Resp: 16   Patient was counseled on RICE protocol and told to rest injury, use ice for no  longer than 15 minutes every hour, compress the area, and elevate above the level of their heart as much as possible to reduce swelling.  Questions answered.  Patient verbalized understanding.    Patient has allergy to multiple medications but states she has taken tramadol in the past without problems. Patient counseled on use of narcotic pain medications. Counseled not to combine these medications with others containing tylenol. Urged not to drink alcohol, drive, or perform any other activities that requires focus while taking these medications. The patient verbalizes understanding and agrees with the plan.    MDM  Wrist injury x 8 days. Full active ROM. Doubt fracture. Wrist/hand neurovascularly intact. RICE protocol with conservative mgmt with hand f/u if not improving.         Renne Crigler, [place] 03/06/12 [phone]

## 2012-06-08 ENCOUNTER — Emergency Department (HOSPITAL_COMMUNITY)
Admission: EM | Admit: 2012-06-08 | Discharge: 2012-06-08 | Disposition: A | Discharge status: Home / Self Care | Payer: BC Managed Care – PPO | Attending: Emergency Medicine | Admitting: Emergency Medicine

## 2012-06-08 ENCOUNTER — Encounter (HOSPITAL_COMMUNITY): Payer: Self-pay | Admitting: Emergency Medicine

## 2012-06-08 ENCOUNTER — Emergency Department (HOSPITAL_COMMUNITY): Payer: BC Managed Care – PPO

## 2012-06-08 DIAGNOSIS — Y990 Civilian activity done for income or pay: Secondary | ICD-10-CM | POA: Insufficient documentation

## 2012-06-08 DIAGNOSIS — Y9389 Activity, other specified: Secondary | ICD-10-CM | POA: Insufficient documentation

## 2012-06-08 DIAGNOSIS — E119 Type 2 diabetes mellitus without complications: Secondary | ICD-10-CM | POA: Insufficient documentation

## 2012-06-08 DIAGNOSIS — I10 Essential (primary) hypertension: Secondary | ICD-10-CM | POA: Insufficient documentation

## 2012-06-08 DIAGNOSIS — W230XXA Caught, crushed, jammed, or pinched between moving objects, initial encounter: Secondary | ICD-10-CM | POA: Insufficient documentation

## 2012-06-08 DIAGNOSIS — Z880 Allergy status to penicillin: Secondary | ICD-10-CM | POA: Insufficient documentation

## 2012-06-08 DIAGNOSIS — S6980XA Other specified injuries of unspecified wrist, hand and finger(s), initial encounter: Secondary | ICD-10-CM | POA: Insufficient documentation

## 2012-06-08 DIAGNOSIS — S6991XA Unspecified injury of right wrist, hand and finger(s), initial encounter: Secondary | ICD-10-CM

## 2012-06-08 DIAGNOSIS — S6990XA Unspecified injury of unspecified wrist, hand and finger(s), initial encounter: Secondary | ICD-10-CM | POA: Insufficient documentation

## 2012-06-08 DIAGNOSIS — Y9289 Other specified places as the place of occurrence of the external cause: Secondary | ICD-10-CM | POA: Insufficient documentation

## 2012-06-08 DIAGNOSIS — Z79899 Other long term (current) drug therapy: Secondary | ICD-10-CM | POA: Insufficient documentation

## 2012-06-08 MED ORDER — IBUPROFEN 800 MG PO TABS
800.0000 mg | ORAL_TABLET | Freq: Once | ORAL | Status: AC
  Administered 2012-06-08: 800 mg via ORAL
  Filled 2012-06-08: qty 1

## 2012-06-08 NOTE — ED Notes (Signed)
Pt states that she smashed her R pointer finger in a car door today.

## 2012-06-08 NOTE — ED Provider Notes (Signed)
History    This chart was scribed for non-physician practitioner Kennedy Bucker working with Celene Kras, MD by Quintella Reichert, ED Scribe. This patient was seen in room WTR9/WTR9 and the patient's care was started at 6:40 PM .   CSN: [id number]  Arrival date & time 06/08/12  1832      Chief Complaint  Patient presents with  . Finger Injury     The history is provided by the patient. No language interpreter was used.    HPI Comments: Lynn Fields is a 42 y.o. female who presents to the Emergency Department complaining of right index finger injury sustained 7 hours ago, with subsequent constant pain.  Pt states she accidentally closed the finger in a door at work.  She describes the pain as throbbing with burning upon movement, and rates it at a severity of 8/10.  Pt states that pain is exacerbated by movement.  She attempted to treat pain with ice, which failed to relieve pain.  She also notes that she observed visible swelling shortly after the injury, which has since resolved with application of ice.  Pt denies weakness, numbness or tingling in finger. Pt denies fever, chills, nausea, emesis or any other associated symptoms.   Past Medical History  Diagnosis Date  . Diabetes mellitus without complication   . Hypertension     Past Surgical History  Procedure Laterality Date  . Cesarean section    . Tubal ligation      No family history on file.  History  Substance Use Topics  . Smoking status: Never Smoker   . Smokeless tobacco: Never Used  . Alcohol Use: No    OB History   Grav Para Term Preterm Abortions TAB SAB Ect Mult Living                  Review of Systems  Constitutional: Negative for fever and chills.  Gastrointestinal: Negative for nausea and vomiting.  Musculoskeletal:       Right index finger pain and swelling.  All other systems reviewed and are negative.    Allergies  Codeine; Morphine and related; Penicillins; Sudafed; Azithromycin; and  Erythromycin  Home Medications   Current Outpatient Rx  Name  Route  Sig  Dispense  Refill  . lisinopril-hydrochlorothiazide (PRINZIDE,ZESTORETIC) 10-12.5 MG per tablet   Oral   Take 1 tablet by mouth daily before breakfast.            BP 143/91  Pulse 90  Temp(Src) 98.5 F (36.9 C) (Oral)  SpO2 98%  Physical Exam  Nursing note and vitals reviewed. Constitutional: She is oriented to person, place, and time. She appears well-developed and well-nourished. No distress.  HENT:  Head: Normocephalic and atraumatic.  Mouth/Throat: Oropharynx is clear and moist.  Eyes: Conjunctivae and EOM are normal.  Neck: Normal range of motion. Neck supple.  Cardiovascular: Normal rate, regular rhythm and normal heart sounds.   Pulmonary/Chest: Effort normal and breath sounds normal. No respiratory distress.  Musculoskeletal: Normal range of motion. She exhibits no edema.  No swelling. Capillary refill normal. Tenderness from PIP to DIP. Good ROM.  Neurological: She is alert and oriented to person, place, and time. No sensory deficit.  Skin: Skin is warm and dry.  Psychiatric: She has a normal mood and affect. Her behavior is normal.    ED Course  Procedures (including critical care time)  DIAGNOSTIC STUDIES: Oxygen Saturation is 98% on room air, normal by my interpretation.    COORDINATION  OF CARE: 6:42 PM-Discussed treatment plan which includes ibuprofen and imaging to rule out fracture with pt at bedside and pt agreed to plan.      Labs Reviewed - No data to display Dg Finger Index Right  06/08/2012  *RADIOLOGY REPORT*  Clinical Data: Right index finger injury and pain.  RIGHT INDEX FINGER 2+V  Comparison: None  Findings: There is no evidence of acute fracture, subluxation or dislocation. No focal bony lesions are present. No radiopaque foreign bodies are identified.  IMPRESSION: No evidence of bony abnormality.   Original Report Authenticated By: Harmon Pier, M.D.      1.  Finger injury, right, initial encounter       MDM  42 y/o female with finger injury. Xray without any acute abnormality. Finger splint given for comfort. RICE discussed. Neurovascularly intact.     I personally performed the services described in this documentation, which was scribed in my presence. The recorded information has been reviewed and is accurate.   Trevor Mace, PA-C 06/08/12 1909

## 2012-06-09 NOTE — ED Provider Notes (Signed)
Medical screening examination/treatment/procedure(s) were performed by non-physician practitioner and as supervising physician I was immediately available for consultation/collaboration.    Emmerie Battaglia R Montrice Gracey, MD 06/09/12 0017 

## 2012-06-14 ENCOUNTER — Encounter: Payer: Self-pay | Admitting: Internal Medicine

## 2012-06-14 ENCOUNTER — Ambulatory Visit (INDEPENDENT_AMBULATORY_CARE_PROVIDER_SITE_OTHER)
Admission: RE | Admit: 2012-06-14 | Discharge: 2012-06-14 | Disposition: A | Discharge status: Home / Self Care | Payer: BC Managed Care – PPO | Source: Ambulatory Visit | Attending: Internal Medicine | Admitting: Internal Medicine

## 2012-06-14 ENCOUNTER — Other Ambulatory Visit (INDEPENDENT_AMBULATORY_CARE_PROVIDER_SITE_OTHER): Payer: BC Managed Care – PPO

## 2012-06-14 ENCOUNTER — Ambulatory Visit (INDEPENDENT_AMBULATORY_CARE_PROVIDER_SITE_OTHER): Payer: BC Managed Care – PPO | Admitting: Internal Medicine

## 2012-06-14 VITALS — BP 132/86 | HR 75 | Temp 99.2°F | Ht 60.0 in | Wt 282.0 lb

## 2012-06-14 DIAGNOSIS — E119 Type 2 diabetes mellitus without complications: Secondary | ICD-10-CM

## 2012-06-14 DIAGNOSIS — Z0000 Encounter for general adult medical examination without abnormal findings: Secondary | ICD-10-CM

## 2012-06-14 DIAGNOSIS — I10 Essential (primary) hypertension: Secondary | ICD-10-CM

## 2012-06-14 DIAGNOSIS — E1165 Type 2 diabetes mellitus with hyperglycemia: Secondary | ICD-10-CM | POA: Insufficient documentation

## 2012-06-14 DIAGNOSIS — M25512 Pain in left shoulder: Secondary | ICD-10-CM

## 2012-06-14 DIAGNOSIS — M25519 Pain in unspecified shoulder: Secondary | ICD-10-CM

## 2012-06-14 DIAGNOSIS — Z1329 Encounter for screening for other suspected endocrine disorder: Secondary | ICD-10-CM

## 2012-06-14 DIAGNOSIS — Z130 Encounter for screening for diseases of the blood and blood-forming organs and certain disorders involving the immune mechanism: Secondary | ICD-10-CM

## 2012-06-14 DIAGNOSIS — Z13220 Encounter for screening for lipoid disorders: Secondary | ICD-10-CM

## 2012-06-14 LAB — CBC
MCHC: 34.1 g/dL (ref 30.0–36.0)
MCV: 79.6 fl (ref 78.0–100.0)
RDW: 15.1 % — ABNORMAL HIGH (ref 11.5–14.6)

## 2012-06-14 LAB — LIPID PANEL
Cholesterol: 174 mg/dL (ref 0–200)
LDL Cholesterol: 115 mg/dL — ABNORMAL HIGH (ref 0–99)
Triglycerides: 32 mg/dL (ref 0.0–149.0)
VLDL: 6.4 mg/dL (ref 0.0–40.0)

## 2012-06-14 LAB — BASIC METABOLIC PANEL
Chloride: 105 mEq/L (ref 96–112)
GFR: 99.49 mL/min (ref >60.00)
Potassium: 4.5 mEq/L (ref 3.5–5.1)
Sodium: 138 mEq/L (ref 135–145)

## 2012-06-14 MED ORDER — LISINOPRIL-HYDROCHLOROTHIAZIDE 10-12.5 MG PO TABS: 1.0000 | ORAL_TABLET | Freq: Every day | ORAL | Status: DC

## 2012-06-14 NOTE — Patient Instructions (Signed)

## 2012-06-14 NOTE — Assessment & Plan Note (Signed)
Uncontrolled Will check A1C today Will restart medication based on level Will refer to diabetes education

## 2012-06-14 NOTE — Assessment & Plan Note (Signed)
Continue to work on diet and exercise Referral to nutrition placed

## 2012-06-14 NOTE — Assessment & Plan Note (Signed)
Uncontrolled Will restart medication today Continue to work on diet and exercise Continue to avoid sodium in your diet

## 2012-06-14 NOTE — Assessment & Plan Note (Signed)
Will obtain a xray of left shoulder today ? Rotator cuff tear versus ligament injury, will most likely need MRI Continue tylenol right now for pain

## 2012-06-14 NOTE — Progress Notes (Signed)
HPI  Pt presents to the clinic today to establish care. She does not have a PCP. She goes to urgent care and ED when needed. She does have a history of hypertension and diabetes mellitus type 2. She has been out of her medications for at least 2 months. She does not check her blood sugars at home. When she goes to the doctor, they check them and they run 72-150. She does monitor her blood pressure at [employer]. It ranges from 135/85-150/90. She is compliant with her diet. She is having trouble losing weight. She used to see the bariatric center to lose weight but it seemed to not be effective. She would like a referral to a nutritionist. Additionally today, she does have some concerns about pain in her left shoulder. It has been constant 8/10. She has has some associated weakness in that arm. 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 She denies a specific injury to the shoulder. She does take tylenol for the pain which does not really help. She denies numbness or tingling down the arm.  Flu: never Pneumovax: never Tetanus: 2006 LMP: 05/27/12 Pap smear: 2005 Eye doctor: 2007 Dentist: as needed  Past Medical History  Diagnosis Date  . Diabetes mellitus without complication   . Hypertension   . Migraines   . Depression   . Ulcer     Current Outpatient Prescriptions  Medication Sig Dispense Refill  . lisinopril-hydrochlorothiazide (PRINZIDE,ZESTORETIC) 10-12.5 MG per tablet Take 1 tablet by mouth daily before breakfast.       . metFORMIN (GLUCOPHAGE) 500 MG tablet Take 500 mg by mouth 2 (two) times daily with a meal.       No current facility-administered medications for this visit.    Allergies  Allergen Reactions  . Codeine Itching  . Morphine And Related Itching  . Penicillins Swelling  . Sudafed (Pseudoephedrine Hcl) Swelling  . Azithromycin Rash  . Erythromycin Rash    Family History  Problem Relation Age of Onset  . Diabetes Mother   .  Hypertension Mother   . Heart disease Mother   . Hyperlipidemia Mother   . Diabetes Father   . Hypertension Father   . Heart disease Father   . Hyperlipidemia Father   . Cervical cancer Sister     History   Social History  . Marital Status: Married    Spouse Name: N/A    Number of Children: N/A  . Years of Education: 16   Occupational History  . Teacher    Social History Main Topics  . Smoking status: Never Smoker   . Smokeless tobacco: Never Used  . Alcohol Use: No  . Drug Use: No  . Sexually Active: Not on file   Other Topics Concern  . Not on file   Social History Narrative   Regular exercise-yes   Caffeine Use-no    ROS:  Constitutional: Denies fever, malaise, fatigue, headache or abrupt weight changes.  HEENT: Pt reports blurred vision. Denies eye pain, eye redness, ear pain, ringing in the ears, wax buildup, runny nose, nasal congestion, bloody nose, or sore throat. Respiratory: Denies difficulty breathing, shortness of breath, cough or sputum production.   Cardiovascular: Denies chest pain, chest tightness, palpitations or swelling in the hands or feet.  Gastrointestinal: Denies abdominal pain, bloating, constipation, diarrhea or blood in the stool.  GU: Denies frequency, urgency, pain with urination, blood in urine, odor or discharge. Musculoskeletal: Pt reports left  shoulder pain and weakness in left arm. Denies decrease in range of motion, difficulty with gait, muscle pain or joint swelling.  Skin: Denies redness, rashes, lesions or ulcercations.  Neurological: Denies dizziness, difficulty with memory, difficulty with speech or problems with balance and coordination.   No other specific complaints in a complete review of systems (except as listed in HPI above).  PE:  BP 132/86  Pulse 75  Temp(Src) 99.2 F (37.3 C) (Oral)  Ht 5' (1.524 m)  Wt 282 lb (127.914 kg)  BMI 55.07 kg/m2  SpO2 98%  LMP 05/27/2012 Wt Readings from Last 3 Encounters:   06/14/12 282 lb (127.914 kg)    General: Appears her stated age, obese but well developed, well nourished in NAD. HEENT: Head: normal shape and size; Eyes: sclera white, no icterus, conjunctiva pink, PERRLA and EOMs intact; Ears: Tm's gray and intact, normal light reflex; Nose: mucosa pink and moist, septum midline; Throat/Mouth: Teeth present, mucosa pink and moist, no lesions or ulcerations noted.  Neck: Normal range of motion. Neck supple, trachea midline. No massses, lumps or thyromegaly present.  Cardiovascular: Normal rate and rhythm. S1,S2 noted.  No murmur, rubs or gallops noted. No JVD or BLE edema. No carotid bruits noted. Pulmonary/Chest: Normal effort and positive vesicular breath sounds. No respiratory distress. No wheezes, rales or ronchi noted.  Abdomen: Soft and nontender. Normal bowel sounds, no bruits noted. No distention or masses noted. Liver, spleen and kidneys non palpable. Musculoskeletal: Positive drop can test. Strength 5/5 RUE, strength 4/5 LUE. No signs of joint swelling. No difficulty with gait.  Neurological: Alert and oriented. Cranial nerves II-XII intact. Coordination normal. +DTRs bilaterally. Psychiatric: Mood and affect normal. Behavior is normal. Judgment and thought content normal.      Assessment and Plan:  Preventative Health Maintenance:  Pt declines all preventative vaccines today Encouraged pt to go to eye doctor and dentist at least annually Encouraged pt to schedule pap smear in the next few months Encouraged pt to work on diet and exercise Labs obtained today.

## 2012-06-23 ENCOUNTER — Encounter: Payer: Self-pay | Admitting: Internal Medicine

## 2012-06-23 MED ORDER — CITALOPRAM HYDROBROMIDE 10 MG PO TABS: 10.0000 mg | ORAL_TABLET | Freq: Every day | ORAL | Status: DC

## 2012-07-01 ENCOUNTER — Encounter: Payer: Self-pay | Admitting: Internal Medicine

## 2012-08-27 ENCOUNTER — Other Ambulatory Visit (HOSPITAL_COMMUNITY)
Admission: RE | Admit: 2012-08-27 | Discharge: 2012-08-27 | Disposition: A | Discharge status: Home / Self Care | Payer: BC Managed Care – PPO | Source: Ambulatory Visit | Attending: Internal Medicine | Admitting: Internal Medicine

## 2012-08-27 ENCOUNTER — Ambulatory Visit: Payer: BC Managed Care – PPO | Admitting: *Deleted

## 2012-08-27 ENCOUNTER — Ambulatory Visit (INDEPENDENT_AMBULATORY_CARE_PROVIDER_SITE_OTHER): Payer: BC Managed Care – PPO | Admitting: Internal Medicine

## 2012-08-27 ENCOUNTER — Encounter: Payer: Self-pay | Admitting: Internal Medicine

## 2012-08-27 VITALS — BP 122/80 | HR 78 | Temp 98.0°F | Wt 282.8 lb

## 2012-08-27 DIAGNOSIS — Z124 Encounter for screening for malignant neoplasm of cervix: Secondary | ICD-10-CM

## 2012-08-27 DIAGNOSIS — I10 Essential (primary) hypertension: Secondary | ICD-10-CM

## 2012-08-27 DIAGNOSIS — Z01419 Encounter for gynecological examination (general) (routine) without abnormal findings: Secondary | ICD-10-CM

## 2012-08-27 DIAGNOSIS — N920 Excessive and frequent menstruation with regular cycle: Secondary | ICD-10-CM

## 2012-08-27 MED ORDER — LISINOPRIL-HYDROCHLOROTHIAZIDE 10-12.5 MG PO TABS: 1.0000 | ORAL_TABLET | Freq: Every day | ORAL | Status: DC

## 2012-08-27 NOTE — Addendum Note (Signed)
Addended by: Lyanne Co R on: 08/27/2012 01:36 PM   Modules accepted: Orders

## 2012-08-27 NOTE — Progress Notes (Signed)
Subjective:    Patient ID: Lynn Fields, female    DOB: 22-Nov-1970, 42 y.o.   MRN: [id number]  HPI  Pt presents to the clinic today to for her pap smear. Her LMP was 08/13/2012. Her last pap smear was more than 5 years ago. She does complain of heavy periods that are very painful. She does have large clots. She has no history of fibroids.  Review of Systems      Past Medical History  Diagnosis Date  . Diabetes mellitus without complication   . Hypertension   . Migraines   . Depression   . Ulcer     Current Outpatient Prescriptions  Medication Sig Dispense Refill  . citalopram (CELEXA) 10 MG tablet Take 1 tablet (10 mg total) by mouth daily.  30 tablet  3  . lisinopril-hydrochlorothiazide (PRINZIDE,ZESTORETIC) 10-12.5 MG per tablet Take 1 tablet by mouth daily before breakfast.  30 tablet  2  . metFORMIN (GLUCOPHAGE) 500 MG tablet Take 500 mg by mouth 2 (two) times daily with a meal.       No current facility-administered medications for this visit.    Allergies  Allergen Reactions  . Codeine Itching  . Morphine And Related Itching  . Penicillins Swelling  . Sudafed (Pseudoephedrine Hcl) Swelling  . Azithromycin Rash  . Erythromycin Rash    Family History  Problem Relation Age of Onset  . Diabetes Mother   . Hypertension Mother   . Heart disease Mother   . Hyperlipidemia Mother   . Diabetes Father   . Hypertension Father   . Heart disease Father   . Hyperlipidemia Father   . Cervical cancer Sister     History   Social History  . Marital Status: Married    Spouse Name: N/A    Number of Children: N/A  . Years of Education: 16   Occupational History  . Teacher    Social History Main Topics  . Smoking status: Never Smoker   . Smokeless tobacco: Never Used  . Alcohol Use: No  . Drug Use: No  . Sexually Active: Yes   Other Topics Concern  . Not on file   Social History Narrative   Regular exercise-yes   Caffeine Use-no     Constitutional: Denies  fever, malaise, fatigue, headache or abrupt weight changes.  HEENT: Denies eye pain, eye redness, ear pain, ringing in the ears, wax buildup, runny nose, nasal congestion, bloody nose, or sore throat. Respiratory: Denies difficulty breathing, shortness of breath, cough or sputum production.   Cardiovascular: Denies chest pain, chest tightness, palpitations or swelling in the hands or feet.  Gastrointestinal: Denies abdominal pain, bloating, constipation, diarrhea or blood in the stool.  GU: Denies urgency, frequency, pain with urination, burning sensation, blood in urine, odor or discharge. Musculoskeletal: Denies decrease in range of motion, difficulty with gait, muscle pain or joint pain and swelling.  Skin: Denies redness, rashes, lesions or ulcercations.  Neurological: Denies dizziness, difficulty with memory, difficulty with speech or problems with balance and coordination.   No other specific complaints in a complete review of systems (except as listed in HPI above).  Objective:   Physical Exam   Constitutional:  Alert, oriented x 4, well developed, well nourished in no apparent distress. Skin: Skin is warm and dry.  No erythema, lesion or ulceration noted.    Cardiovascular: Normal rate and rhythm. S1,S2 noted.  No murmur, rubs or gallops noted. No JVD or BLE edema. No carotid bruits noted. Pulmonary/Chest: Normal  effort and positive vesicular breath sounds. No respiratory distress. No wheezes, rales or ronchi noted.  Genitourinary: Normal female anatomy. Uterus midline, anterior and soft. No CMT or discharge noted. Adenexa non palpable. Breast without lumps or masses.         Assessment & Plan:  Screening for cervical cancer with routine gyn exam:  Breast exam performed Pap smear obtain- will send off for processing and call you with the results.  Menorrhagia, new onset:  Will order transvaginal ultrasound to r/o fibroids  RTC in 1 year or sooner if needed

## 2012-08-27 NOTE — Patient Instructions (Signed)

## 2012-09-02 ENCOUNTER — Other Ambulatory Visit: Payer: Self-pay | Admitting: Internal Medicine

## 2012-09-02 DIAGNOSIS — N920 Excessive and frequent menstruation with regular cycle: Secondary | ICD-10-CM

## 2012-09-03 ENCOUNTER — Ambulatory Visit: Payer: BC Managed Care – PPO | Admitting: Internal Medicine

## 2012-09-07 ENCOUNTER — Encounter: Payer: Self-pay | Admitting: Internal Medicine

## 2012-10-01 ENCOUNTER — Ambulatory Visit (INDEPENDENT_AMBULATORY_CARE_PROVIDER_SITE_OTHER): Payer: BC Managed Care – PPO | Admitting: *Deleted

## 2012-10-01 ENCOUNTER — Ambulatory Visit
Admission: RE | Admit: 2012-10-01 | Discharge: 2012-10-01 | Disposition: A | Discharge status: Home / Self Care | Payer: BC Managed Care – PPO | Source: Ambulatory Visit | Attending: Internal Medicine | Admitting: Internal Medicine

## 2012-10-01 DIAGNOSIS — N920 Excessive and frequent menstruation with regular cycle: Secondary | ICD-10-CM

## 2012-10-01 DIAGNOSIS — Z23 Encounter for immunization: Secondary | ICD-10-CM

## 2012-10-23 ENCOUNTER — Other Ambulatory Visit: Payer: Self-pay | Admitting: Internal Medicine

## 2012-10-25 ENCOUNTER — Other Ambulatory Visit: Payer: Self-pay | Admitting: Internal Medicine

## 2012-12-15 LAB — HM DIABETES EYE EXAM

## 2013-03-02 ENCOUNTER — Emergency Department (HOSPITAL_COMMUNITY)
Admission: EM | Admit: 2013-03-02 | Discharge: 2013-03-03 | Disposition: A | Discharge status: Home / Self Care | Payer: BC Managed Care – PPO | Attending: Emergency Medicine | Admitting: Emergency Medicine

## 2013-03-02 ENCOUNTER — Encounter (HOSPITAL_COMMUNITY): Payer: Self-pay | Admitting: Emergency Medicine

## 2013-03-02 DIAGNOSIS — Z3202 Encounter for pregnancy test, result negative: Secondary | ICD-10-CM | POA: Insufficient documentation

## 2013-03-02 DIAGNOSIS — R51 Headache: Secondary | ICD-10-CM | POA: Insufficient documentation

## 2013-03-02 DIAGNOSIS — F329 Major depressive disorder, single episode, unspecified: Secondary | ICD-10-CM | POA: Insufficient documentation

## 2013-03-02 DIAGNOSIS — R112 Nausea with vomiting, unspecified: Secondary | ICD-10-CM

## 2013-03-02 DIAGNOSIS — Z880 Allergy status to penicillin: Secondary | ICD-10-CM | POA: Insufficient documentation

## 2013-03-02 DIAGNOSIS — F3289 Other specified depressive episodes: Secondary | ICD-10-CM | POA: Insufficient documentation

## 2013-03-02 DIAGNOSIS — Z79899 Other long term (current) drug therapy: Secondary | ICD-10-CM | POA: Insufficient documentation

## 2013-03-02 DIAGNOSIS — I10 Essential (primary) hypertension: Secondary | ICD-10-CM | POA: Insufficient documentation

## 2013-03-02 DIAGNOSIS — Z872 Personal history of diseases of the skin and subcutaneous tissue: Secondary | ICD-10-CM | POA: Insufficient documentation

## 2013-03-02 DIAGNOSIS — E119 Type 2 diabetes mellitus without complications: Secondary | ICD-10-CM | POA: Insufficient documentation

## 2013-03-02 DIAGNOSIS — R1084 Generalized abdominal pain: Secondary | ICD-10-CM | POA: Insufficient documentation

## 2013-03-02 DIAGNOSIS — R197 Diarrhea, unspecified: Secondary | ICD-10-CM | POA: Insufficient documentation

## 2013-03-02 LAB — CBC WITH DIFFERENTIAL/PLATELET
Basophils Absolute: 0 10*3/uL (ref 0.0–0.1)
Basophils Relative: 0 % (ref 0–1)
Eosinophils Absolute: 0.2 10*3/uL (ref 0.0–0.7)
Eosinophils Relative: 2 % (ref 0–5)
HEMATOCRIT: 40 % (ref 36.0–46.0)
Hemoglobin: 13.8 g/dL (ref 12.0–15.0)
LYMPHS PCT: 9 % — AB (ref 12–46)
Lymphs Abs: 0.9 10*3/uL (ref 0.7–4.0)
MCH: 27.1 pg (ref 26.0–34.0)
MCHC: 34.5 g/dL (ref 30.0–36.0)
MCV: 78.4 fL (ref 78.0–100.0)
Monocytes Absolute: 0.6 10*3/uL (ref 0.1–1.0)
Monocytes Relative: 6 % (ref 3–12)
NEUTROS PCT: 84 % — AB (ref 43–77)
Neutro Abs: 8.2 10*3/uL — ABNORMAL HIGH (ref 1.7–7.7)
Platelets: 262 10*3/uL (ref 150–400)
RBC: 5.1 MIL/uL (ref 3.87–5.11)
RDW: 14.2 % (ref 11.5–15.5)
WBC: 9.8 10*3/uL (ref 4.0–10.5)

## 2013-03-02 LAB — POCT PREGNANCY, URINE: Preg Test, Ur: NEGATIVE

## 2013-03-02 LAB — COMPREHENSIVE METABOLIC PANEL
ALT: 11 U/L (ref 0–35)
AST: 16 U/L (ref 0–37)
Albumin: 3.8 g/dL (ref 3.5–5.2)
Alkaline Phosphatase: 67 U/L (ref 39–117)
BILIRUBIN TOTAL: 0.7 mg/dL (ref 0.3–1.2)
BUN: 12 mg/dL (ref 6–23)
CO2: 22 meq/L (ref 19–32)
Calcium: 8.7 mg/dL (ref 8.4–10.5)
Chloride: 101 mEq/L (ref 96–112)
Creatinine, Ser: 0.77 mg/dL (ref 0.50–1.10)
GFR calc Af Amer: >90 mL/min (ref >90)
Glucose, Bld: 116 mg/dL — ABNORMAL HIGH (ref 70–99)
Potassium: 3.9 mEq/L (ref 3.7–5.3)
SODIUM: 137 meq/L (ref 137–147)
Total Protein: 7.7 g/dL (ref 6.0–8.3)

## 2013-03-02 MED ORDER — SODIUM CHLORIDE 0.9 % IV BOLUS (SEPSIS)
1000.0000 mL | Freq: Once | INTRAVENOUS | Status: AC
  Administered 2013-03-02: 1000 mL via INTRAVENOUS

## 2013-03-02 MED ORDER — KETOROLAC TROMETHAMINE 30 MG/ML IJ SOLN
30.0000 mg | Freq: Once | INTRAMUSCULAR | Status: AC
  Administered 2013-03-02: 30 mg via INTRAVENOUS
  Filled 2013-03-02: qty 1

## 2013-03-02 MED ORDER — ONDANSETRON HCL 4 MG/2ML IJ SOLN
4.0000 mg | Freq: Once | INTRAMUSCULAR | Status: AC
  Administered 2013-03-02: 4 mg via INTRAVENOUS
  Filled 2013-03-02: qty 2

## 2013-03-02 NOTE — ED Notes (Signed)
Pt states this morning about 1130 she started feeling nauseated  Pt states she then developed diarrhea and vomiting  Pt states she is unable to hold even water down  Pt states she is also having abd pain

## 2013-03-02 NOTE — ED Notes (Signed)
Assumed care of patient Patient ambulated to bathroom to provide urine specimen---gait steady No N/V at this time Patient appears in NAD

## 2013-03-02 NOTE — ED Provider Notes (Signed)
CSN: [id number]     Arrival date & time 03/02/13  2014 History   First MD Initiated Contact with Patient 03/02/13 2207     Chief Complaint  Patient presents with  . Emesis  . Diarrhea  . Headache   (Consider location/radiation/quality/duration/timing/severity/associated sxs/prior Treatment) The history is provided by the patient and medical records.   This is a 43 y.o. F with PMH significant for hypertension, diabetes, depression, migraine headaches, presenting to the ED for nausea, vomiting, and diarrhea onset 1130 this morning.  Pt states she has had 7+ episodes of non-bloody, non-bilious emesis and non-bloody diarrhea so far today.  Was running a low grade fever earlier today, attempted to take tylenol and imodium but vomited it up.  Has also tried drinking ginger-ale, gatorade, and water but unable to hold anything down. Has not attempted to eat any solid food today. Denies chills, sweats, generalized body aches, or other URI like sx.  States now she is started to have some generalized abdominal cramping.  Pt past surgical hx includes tubal ligation and c-section.  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  VS stable on arrival.  Past Medical History  Diagnosis Date  . Diabetes mellitus without complication   . Hypertension   . Migraines   . Depression   . Ulcer    Past Surgical History  Procedure Laterality Date  . Cesarean section  1993  . Tubal ligation  1993   Family History  Problem Relation Age of Onset  . Diabetes Mother   . Hypertension Mother   . Heart disease Mother   . Hyperlipidemia Mother   . Diabetes Father   . Hypertension Father   . Heart disease Father   . Hyperlipidemia Father   . Cervical cancer Sister    History  Substance Use Topics  . Smoking status: Never Smoker   . Smokeless tobacco: Never Used  . Alcohol Use: No   OB History   Grav Para Term Preterm Abortions TAB SAB Ect Mult Living                Review of Systems  Gastrointestinal: Positive for nausea, vomiting and diarrhea.  All other systems reviewed and are negative.    Allergies  Codeine; Morphine and related; Penicillins; Sudafed; Azithromycin; and Erythromycin  Home Medications   Current Outpatient Rx  Name  Route  Sig  Dispense  Refill  . acetaminophen (TYLENOL) 500 MG tablet   Oral   Take 1,000 mg by mouth every 6 (six) hours as needed for moderate pain.         . citalopram (CELEXA) 10 MG tablet   Oral   Take 10 mg by mouth daily.         Marland Kitchen lisinopril-hydrochlorothiazide (PRINZIDE,ZESTORETIC) 10-12.5 MG per tablet   Oral   Take 1 tablet by mouth daily before breakfast.   30 tablet   5   . loperamide (IMODIUM) 2 MG capsule   Oral   Take 8 mg by mouth daily as needed for diarrhea or loose stools.          BP 122/69  Pulse 87  Temp(Src) 99.1 F (37.3 C) (Oral)  Resp 22  SpO2 98%  LMP 02/13/2013  Physical Exam  Nursing note and vitals reviewed. Constitutional: She is oriented to person, place, and time. She appears well-developed and well-nourished. No distress.  HENT:  Head: Normocephalic and atraumatic.  Mouth/Throat: Oropharynx is clear and moist.  Mildly dry mucus membranes  Eyes: Conjunctivae and EOM are normal. Pupils are equal, round, and reactive to light.  Neck: Normal range of motion.  Cardiovascular: Normal rate, regular rhythm and normal heart sounds.   Pulmonary/Chest: Effort normal and breath sounds normal. No respiratory distress. She has no wheezes.  Abdominal: Soft. Bowel sounds are normal. There is no tenderness. There is no guarding.  Abdomen soft, non-distended, generalized abdominal cramping without focal TTP  Musculoskeletal: Normal range of motion.  Neurological: She is alert and oriented to person, place, and time.  Skin: Skin is warm and dry. She is not diaphoretic.  Psychiatric: She has a normal mood and affect.    ED Course  Procedures  (including critical care time) Labs Review Labs Reviewed  CBC WITH DIFFERENTIAL - Abnormal; Notable for the following:    Neutrophils Relative % 84 (*)    Neutro Abs 8.2 (*)    Lymphocytes Relative 9 (*)    All other components within normal limits  COMPREHENSIVE METABOLIC PANEL - Abnormal; Notable for the following:    Glucose, Bld 116 (*)    All other components within normal limits  POCT PREGNANCY, URINE   Imaging Review No results found.  EKG Interpretation   None       MDM   1. Nausea vomiting and diarrhea    Signs/sx consistent with viral gastroenteritis.  Lab work is reassuring.  Abdominal exam is benign.  Pt given IVF, zofran, and toradol.  Will reassess.  Sx greatly improved after meds.  Will initiate PO challenge and likely discharge.    12:59 AM Pt has tolerated entire ginger ale without recurrent nausea or vomiting.  She has had no episodes of diarrhea while in the ED.  Repeat abdominal exam remains benign-- low suspicion for acute/surgical abdomen at this time.  Pt afebrile, non-toxic appearing, NAD, VS stable- ok for discharge.  Rx zofran.  Encouraged to drink plenty of fluids, start with bland diet and progress as tolerated.  FU with PCP if problems occur.  Discussed plan with pt, she acknowledged understanding and agreed.  Return precautions advised.  Larene Pickett, PA-C 03/03/13 [phone]

## 2013-03-03 MED ORDER — ONDANSETRON 4 MG PO TBDP: 4.0000 mg | ORAL_TABLET | Freq: Three times a day (TID) | ORAL | Status: DC | PRN

## 2013-03-03 NOTE — Discharge Instructions (Signed)
Take the prescribed medication as directed.  Drink plenty of fluids to keep yourself hydrated.  May wish to start with bland diet and progress as tolerated. Follow-up with your primary care physician if problems occur. Return to the ED for new or worsening symptoms.

## 2013-03-04 NOTE — ED Provider Notes (Signed)
Medical screening examination/treatment/procedure(s) were performed by non-physician practitioner and as supervising physician I was immediately available for consultation/collaboration.  EKG Interpretation   None         Osvaldo Shipper, MD 03/04/13 1520

## 2013-03-31 ENCOUNTER — Encounter: Payer: Self-pay | Admitting: Physician Assistant

## 2013-03-31 ENCOUNTER — Ambulatory Visit (INDEPENDENT_AMBULATORY_CARE_PROVIDER_SITE_OTHER): Payer: BC Managed Care – PPO | Admitting: Physician Assistant

## 2013-03-31 VITALS — BP 128/72 | HR 89 | Temp 97.9°F | Resp 20 | Wt 292.0 lb

## 2013-03-31 DIAGNOSIS — F32A Depression, unspecified: Secondary | ICD-10-CM

## 2013-03-31 DIAGNOSIS — F329 Major depressive disorder, single episode, unspecified: Secondary | ICD-10-CM

## 2013-03-31 DIAGNOSIS — I10 Essential (primary) hypertension: Secondary | ICD-10-CM

## 2013-03-31 DIAGNOSIS — F3289 Other specified depressive episodes: Secondary | ICD-10-CM

## 2013-03-31 DIAGNOSIS — E119 Type 2 diabetes mellitus without complications: Secondary | ICD-10-CM

## 2013-03-31 MED ORDER — CITALOPRAM HYDROBROMIDE 10 MG PO TABS: 10.0000 mg | ORAL_TABLET | Freq: Every day | ORAL | Status: DC

## 2013-03-31 MED ORDER — LISINOPRIL-HYDROCHLOROTHIAZIDE 10-12.5 MG PO TABS: 1.0000 | ORAL_TABLET | Freq: Every day | ORAL | Status: DC

## 2013-03-31 NOTE — Progress Notes (Signed)
Pre visit review using our clinic review tool, if applicable. No additional management support is needed unless otherwise documented below in the visit note. 

## 2013-03-31 NOTE — Patient Instructions (Addendum)
It was great to meet you today Lynn Fields!  Labs have been ordered for you, when you report to lab please be fasting.   Refills have been sent to your pharmacy   Diabetes and Exercise Exercising regularly is important. It is not just about losing weight. It has many health benefits, such as:  Improving your overall fitness, flexibility, and endurance.  Increasing your bone density.  Helping with weight control.  Decreasing your body fat.  Increasing your muscle strength.  Reducing stress and tension.  Improving your overall health. People with diabetes who exercise gain additional benefits because exercise:  Reduces appetite.  Improves the body's use of blood sugar (glucose).  Helps lower or control blood glucose.  Decreases blood pressure.  Helps control blood lipids (such as cholesterol and triglycerides).  Improves the body's use of the hormone insulin by:  Increasing the body's insulin sensitivity.  Reducing the body's insulin needs.  Decreases the risk for heart disease because exercising:  Lowers cholesterol and triglycerides levels.  Increases the levels of good cholesterol (such as high-density lipoproteins [HDL]) in the body.  Lowers blood glucose levels. YOUR ACTIVITY PLAN  Choose an activity that you enjoy and set realistic goals. Your health care provider or diabetes educator can help you make an activity plan that works for you. You can break activities into 2 or 3 sessions throughout the day. Doing so is as good as one long session. Exercise ideas include:  Taking the dog for a walk.  Taking the stairs instead of the elevator.  Dancing to your favorite song.  Doing your favorite exercise with a friend. RECOMMENDATIONS FOR EXERCISING WITH TYPE 1 OR TYPE 2 DIABETES   Check your blood glucose before exercising. If blood glucose levels are greater than 240 mg/dL, check for urine ketones. Do not exercise if ketones are present.  Avoid injecting  insulin into areas of the body that are going to be exercised. For example, avoid injecting insulin into:  The arms when playing tennis.  The legs when jogging.  Keep a record of:  Food intake before and after you exercise.  Expected peak times of insulin action.  Blood glucose levels before and after you exercise.  The type and amount of exercise you have done.  Review your records with your health care provider. Your health care provider will help you to develop guidelines for adjusting food intake and insulin amounts before and after exercising.  If you take insulin or oral hypoglycemic agents, watch for signs and symptoms of hypoglycemia. They include:  Dizziness.  Shaking.  Sweating.  Chills.  Confusion.  Drink plenty of water while you exercise to prevent dehydration or heat stroke. Body water is lost during exercise and must be replaced.  Talk to your health care provider before starting an exercise program to make sure it is safe for you. Remember, almost any type of activity is better than none. Document Released: 04/05/2003 Document Revised: 09/15/2012 Document Reviewed: 06/22/2012 [facility] Patient Information 2014 [place].

## 2013-04-02 NOTE — Progress Notes (Signed)
   Subjective:    Patient ID: Lynn Fields, female    DOB: [birth year], 43 y.o.   MRN: [id number]  HPI Comments: Patient is a 43 year old female who presents to the office for a follow up visit. Reports she would like to have refills for her current medications. Reports she was diabetic at one time and was taking Metformin however has not been on it for some time because she able to get her diabetes under control through diet and exercise. States in last couple months she has not been eating right or exercising, and would not be surprised if needed to start back on it. Reports under some increase stress at home with her father-in-law recently moving in with her and her husband. Uses Celexa 10 mg one tab daily for depression and lisinopril-hydrochlorothiazide 10-12.5 mg once daily for HTN.     Review of Systems  Constitutional: Positive for activity change. Negative for fever, chills and appetite change.  Eyes: Negative for pain and visual disturbance.  Respiratory: Negative for cough, chest tightness, shortness of breath and wheezing.   Cardiovascular: Negative for chest pain and palpitations.  Gastrointestinal: Negative for nausea.  Neurological: Negative for dizziness, weakness, light-headedness, numbness and headaches.  All other systems reviewed and are negative.       Past Medical History  Diagnosis Date  . Diabetes mellitus without complication   . Hypertension   . Migraines   . Depression   . Ulcer      Objective:   Physical Exam  Vitals reviewed. Constitutional: She is oriented to person, place, and time. She appears well-developed and well-nourished. No distress.  HENT:  Head: Normocephalic and atraumatic.  Right Ear: External ear normal.  Left Ear: External ear normal.  Nose: Nose normal.  Mouth/Throat: Oropharynx is clear and moist.  Eyes: Conjunctivae are normal. No scleral icterus.  Neck: Normal range of motion.  Cardiovascular: Normal rate and regular rhythm.   Exam reveals no gallop and no friction rub.   No murmur heard. Pulmonary/Chest: Effort normal and breath sounds normal. She has no wheezes. She has no rales.  Musculoskeletal: Normal range of motion.  Foot exam - bilateral normal; no swelling, tenderness or skin or vascular lesions. Color and temperature is normal. Sensation is intact. Peripheral pulses are palpable. Toenails are normal.    Neurological: She is alert and oriented to person, place, and time.  Skin: Skin is warm and dry. She is not diaphoretic.  Psychiatric: She has a normal mood and affect.    Filed Vitals:   03/31/13 1435  BP: 128/72  Pulse: 89  Temp: 97.9 F (36.6 C)  Resp: 20   Wt Readings from Last 3 Encounters:  03/31/13 292 lb (132.45 kg)  08/27/12 282 lb 12.8 oz (128.277 kg)  06/14/12 282 lb (127.914 kg)       Assessment & Plan:    HTN: Well controlled with current medications Labs ordered today Refills done Lisinopril - HCTZ 10-12.5 mg one tab daily  Depression: Well controlled with current medications Refill done Celexa 10 mg one tab daily  DM: Hemoglobin A1c ordered today

## 2013-04-21 ENCOUNTER — Encounter: Payer: Self-pay | Admitting: Physician Assistant

## 2013-04-21 ENCOUNTER — Other Ambulatory Visit (INDEPENDENT_AMBULATORY_CARE_PROVIDER_SITE_OTHER): Payer: BC Managed Care – PPO

## 2013-04-21 DIAGNOSIS — E119 Type 2 diabetes mellitus without complications: Secondary | ICD-10-CM

## 2013-04-21 DIAGNOSIS — I10 Essential (primary) hypertension: Secondary | ICD-10-CM

## 2013-04-21 LAB — BASIC METABOLIC PANEL
BUN: 15 mg/dL (ref 6–23)
CHLORIDE: 107 meq/L (ref 96–112)
CO2: 25 mEq/L (ref 19–32)
Calcium: 9 mg/dL (ref 8.4–10.5)
Creatinine, Ser: 0.9 mg/dL (ref 0.4–1.2)
GFR: 95.03 mL/min (ref >60.00)
Glucose, Bld: 118 mg/dL — ABNORMAL HIGH (ref 70–99)
POTASSIUM: 4.1 meq/L (ref 3.5–5.1)
SODIUM: 139 meq/L (ref 135–145)

## 2013-04-21 LAB — MICROALBUMIN / CREATININE URINE RATIO
CREATININE, U: 281.9 mg/dL
MICROALB UR: 0.3 mg/dL (ref 0.0–1.9)
MICROALB/CREAT RATIO: 0.1 mg/g (ref 0.0–30.0)

## 2013-04-21 LAB — URINALYSIS, ROUTINE W REFLEX MICROSCOPIC
Bilirubin Urine: NEGATIVE
KETONES UR: NEGATIVE
Leukocytes, UA: NEGATIVE
Nitrite: NEGATIVE
Total Protein, Urine: NEGATIVE
URINE GLUCOSE: NEGATIVE
Urobilinogen, UA: 0.2 (ref 0.0–1.0)
pH: 5.5 (ref 5.0–8.0)

## 2013-04-21 LAB — CBC WITH DIFFERENTIAL/PLATELET
BASOS PCT: 0.7 % (ref 0.0–3.0)
Basophils Absolute: 0 10*3/uL (ref 0.0–0.1)
Eosinophils Absolute: 0.2 10*3/uL (ref 0.0–0.7)
Eosinophils Relative: 3.1 % (ref 0.0–5.0)
HCT: 38.7 % (ref 36.0–46.0)
HEMOGLOBIN: 12.9 g/dL (ref 12.0–15.0)
LYMPHS PCT: 37 % (ref 12.0–46.0)
Lymphs Abs: 2.4 10*3/uL (ref 0.7–4.0)
MCHC: 33.3 g/dL (ref 30.0–36.0)
MCV: 81.3 fl (ref 78.0–100.0)
MONO ABS: 0.4 10*3/uL (ref 0.1–1.0)
Monocytes Relative: 6.5 % (ref 3.0–12.0)
NEUTROS ABS: 3.4 10*3/uL (ref 1.4–7.7)
Neutrophils Relative %: 52.7 % (ref 43.0–77.0)
Platelets: 267 10*3/uL (ref 150.0–400.0)
RBC: 4.76 Mil/uL (ref 3.87–5.11)
RDW: 14.9 % — ABNORMAL HIGH (ref 11.5–14.6)
WBC: 6.4 10*3/uL (ref 4.5–10.5)

## 2013-04-21 LAB — HEMOGLOBIN A1C: HEMOGLOBIN A1C: 6.2 % (ref 4.6–6.5)

## 2013-06-02 ENCOUNTER — Ambulatory Visit (INDEPENDENT_AMBULATORY_CARE_PROVIDER_SITE_OTHER): Payer: BC Managed Care – PPO | Admitting: Internal Medicine

## 2013-06-02 ENCOUNTER — Encounter: Payer: Self-pay | Admitting: Internal Medicine

## 2013-06-02 ENCOUNTER — Ambulatory Visit: Payer: BC Managed Care – PPO | Admitting: Physician Assistant

## 2013-06-02 VITALS — BP 120/80 | HR 65 | Temp 98.5°F | Resp 16 | Ht 60.0 in | Wt 297.1 lb

## 2013-06-02 DIAGNOSIS — I10 Essential (primary) hypertension: Secondary | ICD-10-CM

## 2013-06-02 DIAGNOSIS — R519 Headache, unspecified: Secondary | ICD-10-CM | POA: Insufficient documentation

## 2013-06-02 DIAGNOSIS — R42 Dizziness and giddiness: Secondary | ICD-10-CM

## 2013-06-02 DIAGNOSIS — G4730 Sleep apnea, unspecified: Secondary | ICD-10-CM | POA: Insufficient documentation

## 2013-06-02 DIAGNOSIS — R51 Headache: Secondary | ICD-10-CM

## 2013-06-02 LAB — HM DIABETES FOOT EXAM

## 2013-06-02 MED ORDER — MECLIZINE HCL 25 MG PO TABS: 25.0000 mg | ORAL_TABLET | Freq: Three times a day (TID) | ORAL | Status: DC | PRN

## 2013-06-02 MED ORDER — LISINOPRIL-HYDROCHLOROTHIAZIDE 10-12.5 MG PO TABS: 1.0000 | ORAL_TABLET | Freq: Every day | ORAL | Status: DC

## 2013-06-02 NOTE — Patient Instructions (Signed)

## 2013-06-02 NOTE — Progress Notes (Signed)
Pre visit review using our clinic review tool, if applicable. No additional management support is needed unless otherwise documented below in the visit note. 

## 2013-06-02 NOTE — Progress Notes (Signed)
Subjective:    Patient ID: Lynn Fields, female    DOB: [birth year], 43 y.o.   MRN: [id number]  Headache  This is a new problem. The current episode started more than 1 month ago. The problem occurs intermittently. The problem has been unchanged. The pain is located in the bilateral and vertex region. The pain does not radiate. The pain quality is not similar to prior headaches. The quality of the pain is described as aching. The pain is at a severity of 2/10. The pain is mild. Associated symptoms include dizziness (and vertigo) and nausea. Pertinent negatives include no abdominal pain, abnormal behavior, anorexia, back pain, blurred vision, coughing, drainage, ear pain, eye pain, eye redness, eye watering, facial sweating, fever, hearing loss, insomnia, loss of balance, muscle aches, neck pain, numbness, phonophobia, photophobia, rhinorrhea, scalp tenderness, seizures, sinus pressure, sore throat, swollen glands, tingling, tinnitus, visual change, vomiting, weakness or weight loss. Nothing aggravates the symptoms. She has tried nothing for the symptoms. Her past medical history is significant for hypertension and obesity. There is no history of cancer, cluster headaches, immunosuppression, migraine headaches, migraines in the family, pseudotumor cerebri, recent head traumas, sinus disease or TMJ.      Review of Systems  Constitutional: Negative.  Negative for fever, chills, weight loss, diaphoresis, activity change, appetite change, fatigue and unexpected weight change.  HENT: Negative for ear pain, hearing loss, rhinorrhea, sinus pressure, sore throat and tinnitus.   Eyes: Negative.  Negative for blurred vision, photophobia, pain and redness.  Respiratory: Positive for apnea (and heavy snoring). Negative for cough, choking, shortness of breath, wheezing and stridor.   Cardiovascular: Negative.  Negative for palpitations and leg swelling.  Gastrointestinal: Positive for nausea. Negative for  vomiting, abdominal pain, diarrhea, constipation, blood in stool, abdominal distention, anal bleeding, rectal pain and anorexia.  Endocrine: Negative.   Genitourinary: Negative.   Musculoskeletal: Negative.  Negative for arthralgias, back pain, gait problem, joint swelling, myalgias, neck pain and neck stiffness.  Skin: Negative.   Allergic/Immunologic: Negative.   Neurological: Positive for dizziness (and vertigo) and headaches. Negative for tingling, tremors, seizures, syncope, facial asymmetry, speech difficulty, weakness, light-headedness, numbness and loss of balance.  Hematological: Negative.  Negative for adenopathy. Does not bruise/bleed easily.  Psychiatric/Behavioral: Negative.  The patient does not have insomnia.        Objective:   Physical Exam  Vitals reviewed. Constitutional: She is oriented to person, place, and time. She appears well-developed and well-nourished. No distress.  HENT:  Head: Normocephalic and atraumatic.  Mouth/Throat: Oropharynx is clear and moist. No oropharyngeal exudate.  Eyes: Conjunctivae and EOM are normal. Pupils are equal, round, and reactive to light. Right eye exhibits no discharge. Left eye exhibits no discharge. No scleral icterus.  Neck: Normal range of motion. Neck supple. No JVD present. No tracheal deviation present. No thyromegaly present.  Cardiovascular: Normal rate, regular rhythm, normal heart sounds and intact distal pulses.  Exam reveals no gallop and no friction rub.   No murmur heard. Pulmonary/Chest: Effort normal and breath sounds normal. No stridor. No respiratory distress. She has no wheezes. She has no rales. She exhibits no tenderness.  Abdominal: Soft. Bowel sounds are normal. She exhibits no distension and no mass. There is no tenderness. There is no rebound and no guarding.  Musculoskeletal: Normal range of motion. She exhibits no edema and no tenderness.  Lymphadenopathy:    She has no cervical adenopathy.  Neurological:  She is alert and oriented to person, place, and time.  She has normal reflexes. She displays normal reflexes. No cranial nerve deficit. She exhibits normal muscle tone. Coordination normal.  Skin: Skin is warm and dry. No rash noted. She is not diaphoretic. No erythema. No pallor.  Psychiatric: She has a normal mood and affect. Her behavior is normal. Judgment and thought content normal.     Lab Results  Component Value Date   WBC 6.4 04/21/2013   HGB 12.9 04/21/2013   HCT 38.7 04/21/2013   PLT 267.0 04/21/2013   GLUCOSE 118* 04/21/2013   CHOL 174 06/14/2012   TRIG 32.0 06/14/2012   HDL 52.70 06/14/2012   LDLCALC 115* 06/14/2012   ALT 11 03/02/2013   AST 16 03/02/2013   NA 139 04/21/2013   K 4.1 04/21/2013   CL 107 04/21/2013   CREATININE 0.9 04/21/2013   BUN 15 04/21/2013   CO2 25 04/21/2013   TSH 0.98 06/14/2012   HGBA1C 6.2 04/21/2013   MICROALBUR 0.3 04/21/2013       Assessment & Plan:

## 2013-06-03 NOTE — Assessment & Plan Note (Signed)
Her BP is well controlled 

## 2013-06-03 NOTE — Assessment & Plan Note (Signed)
I have asked her to see sleep medicine for further evaluation

## 2013-06-03 NOTE — Assessment & Plan Note (Signed)
She will try meclizine for symptom relief Today I will check labs to look for secondary causes and will get an MRI done to look for structural lesions

## 2013-06-03 NOTE — Assessment & Plan Note (Signed)
I am concerned that her HA and neuro s/s may be caused by OSA so I have ordered a sleep evaluation Also, have ordered a brain MRI to look for mass/structural lesion Will check labs today to look for secondary causes as well

## 2013-06-10 ENCOUNTER — Ambulatory Visit
Admission: RE | Admit: 2013-06-10 | Discharge: 2013-06-10 | Disposition: A | Discharge status: Home / Self Care | Payer: BC Managed Care – PPO | Source: Ambulatory Visit | Attending: Internal Medicine | Admitting: Internal Medicine

## 2013-06-10 DIAGNOSIS — R51 Headache: Secondary | ICD-10-CM

## 2013-06-10 DIAGNOSIS — R42 Dizziness and giddiness: Secondary | ICD-10-CM

## 2013-06-11 ENCOUNTER — Encounter: Payer: Self-pay | Admitting: Internal Medicine

## 2013-07-05 ENCOUNTER — Institutional Professional Consult (permissible substitution): Payer: BC Managed Care – PPO | Admitting: Pulmonary Disease

## 2013-07-26 ENCOUNTER — Telehealth: Payer: Self-pay | Admitting: *Deleted

## 2013-07-26 DIAGNOSIS — E119 Type 2 diabetes mellitus without complications: Secondary | ICD-10-CM

## 2013-07-26 NOTE — Telephone Encounter (Signed)
Patient coming in for lipid panel.  Lipid panel ordered.

## 2013-10-28 ENCOUNTER — Encounter: Payer: Self-pay | Admitting: Internal Medicine

## 2013-10-28 ENCOUNTER — Ambulatory Visit (INDEPENDENT_AMBULATORY_CARE_PROVIDER_SITE_OTHER): Payer: BC Managed Care – PPO | Admitting: Internal Medicine

## 2013-10-28 DIAGNOSIS — R7309 Other abnormal glucose: Secondary | ICD-10-CM

## 2013-10-28 DIAGNOSIS — Z23 Encounter for immunization: Secondary | ICD-10-CM

## 2013-10-28 DIAGNOSIS — I10 Essential (primary) hypertension: Secondary | ICD-10-CM

## 2013-10-28 DIAGNOSIS — R7303 Prediabetes: Secondary | ICD-10-CM

## 2013-10-28 DIAGNOSIS — R42 Dizziness and giddiness: Secondary | ICD-10-CM

## 2013-10-28 NOTE — Patient Instructions (Signed)
We have given you the flu shot today.   You are doing great for your health today! Keep up the good work!  Congratulations on the weight loss, that is hard work paying off!  Come back in about 6 months so we can follow your weight loss and blood pressure.  Serving Sizes What we call a serving size today is larger than it was in the past. A 1950s fast-food burger contained little more than 1 oz of meat, and a soft drink was 8 oz (1 cup). Today, a "quarter pounder" burger is at least 4 times that amount, and a 32 or 64 oz drink is not uncommon. A possible guide for eating when trying to lose weight is to eat about half as much as you normally do. Some estimates of serving sizes are:  1 Dairy serving:Individual container of yogurt (8 oz) or piece of cheese the size of your thumb (1 oz).  1 Grain serving: 1 slice of bread or  cup pasta.  1 Meat serving: The size of a deck of cards (3 oz).  1 Fruit serving: cup canned fruit or 1 medium fruit.  1 Vegetable serving:  cup of cooked or canned vegetables.  1 Fat serving:The size of 4 stacked dimes. Experts suggest spending 1 or 2 days measuring food portions you commonly eat. This will give you better practice at estimating serving sizes, and will also show whether you are eating an appropriate amount of food to meet your weight goals. If you find that you are eating more than you thought, try measuring your food for a few days so you can "reprogram" yourself to learn what makes a healthy portion for you. SUGGESTIONS FOR CONTROL  In restaurants, share entrees, or ask the waiter to put half the entre in a box or bag before you even touch it.  Order lunch-sized portions. Many restaurants serve 4 to 6 oz of meat at lunch, compared with 8 to 10 oz at dinner.  Split dessert or skip it all together. Have a piece of fruit when you get home.  At home, use smaller plates and bowls. It will look as if you are eating more.  Plate your food in the  kitchen rather than serving it "family style" at the table.  Wait 20 to 30 minutes before taking seconds. This is how long it takes your brain to recognize that you are full.  Check food labels for serving sizes. Eat 1 serving only.  Use measuring cups and spoons to see proper serving sizes.  Buy smaller packages of candy, popcorn, and snacks.  Avoid eating directly out of the bag or carton.  While eating half as much, exercise twice as much. Park further away from the mall, take the stairs instead of the escalator, and walk around your block. Losing weight is a slow, difficult process. It takes long-lasting lifestyle changes. You can make gradual changes over time so they become habits. Look to friends and family to support the healthy changes you are making. Avoid fad diets since they are often only temporary weight loss solutions. Document Released: 10/12/2002 Document Revised: 04/07/2011 Document Reviewed: 04/12/2013 [facility] Patient Information 2015 [place], [place]. This information is not intended to replace advice given to you by your health care provider. Make sure you discuss any questions you have with your health care provider.

## 2013-10-28 NOTE — Assessment & Plan Note (Signed)
Last HgA1c 6.2 and will follow about every 6-12 months. If she continues to lose weight may not need diabetic medication again.

## 2013-10-28 NOTE — Assessment & Plan Note (Signed)
Has had a couple episodes since May, do not last long, MRI brain normal May 2015.

## 2013-10-28 NOTE — Assessment & Plan Note (Signed)
She is working with the bariatric clinic and has lost about 20 pounds in the last 2 months.

## 2013-10-28 NOTE — Progress Notes (Signed)
   Subjective:    Patient ID: Lynn Fields, female    DOB: 06/30/1970, 43 y.o.   MRN: [id number]  HPI The patient is a 43 YO female who is coming in today to establish care. She has PMH of diabetes (now pre-diabetes off all meds with weight loss), HTN, obesity. She is working with the bariatric clinic on weight loss through diet and exercise. She has lost about 20 pounds in the last 2 months and intend to continue losing weight. She participated in a 5K last weekend with her husband. She has been eating healthier and drinking more water. She stopped taking celexa because she feels like she does not need it anymore.  Review of Systems  Constitutional: Positive for activity change. Negative for fever, appetite change and fatigue.       Exercising more  HENT: Negative.   Eyes: Negative.   Respiratory: Negative for cough, chest tightness, shortness of breath and wheezing.   Cardiovascular: Negative for chest pain, palpitations and leg swelling.  Gastrointestinal: Negative for abdominal pain, diarrhea, constipation and abdominal distention.  Genitourinary: Negative.   Musculoskeletal: Negative.   Skin: Negative.   Neurological: Positive for dizziness. Negative for weakness, light-headedness and headaches.       Episodes rare  Psychiatric/Behavioral: Negative.       Objective:   Physical Exam  Constitutional: She is oriented to person, place, and time. She appears well-developed and well-nourished.  Overweight  HENT:  Head: Normocephalic and atraumatic.  Eyes: EOM are normal.  Neck: Normal range of motion.  Cardiovascular: Normal rate and regular rhythm.   Pulmonary/Chest: Effort normal and breath sounds normal. No respiratory distress. She has no wheezes. She has no rales.  Abdominal: Soft. Bowel sounds are normal.  Musculoskeletal: Normal range of motion.  Neurological: She is alert and oriented to person, place, and time. Coordination normal.  Skin: Skin is warm and dry.    Psychiatric: She has a normal mood and affect.   Filed Vitals:   10/28/13 1343  BP: 122/68  Pulse: 80  Temp: 97.9 F (36.6 C)  TempSrc: Oral  Resp: 16  Height: 5' (1.524 m)  Weight: 282 lb 12.8 oz (128.277 kg)  SpO2: 98%      Assessment & Plan:

## 2013-10-28 NOTE — Progress Notes (Signed)
Pre visit review using our clinic review tool, if applicable. No additional management support is needed unless otherwise documented below in the visit note. 

## 2013-10-28 NOTE — Assessment & Plan Note (Signed)
BP well controlled on lisinopril-hctz 10/12.5mg . May need downward titration if she continues with her weight loss. Recent BMP reviewed and normal.

## 2013-11-07 ENCOUNTER — Other Ambulatory Visit (HOSPITAL_BASED_OUTPATIENT_CLINIC_OR_DEPARTMENT_OTHER): Payer: Self-pay | Admitting: General Practice

## 2013-11-07 ENCOUNTER — Ambulatory Visit (HOSPITAL_BASED_OUTPATIENT_CLINIC_OR_DEPARTMENT_OTHER)
Admission: RE | Admit: 2013-11-07 | Discharge: 2013-11-07 | Disposition: A | Discharge status: Home / Self Care | Payer: BC Managed Care – PPO | Source: Ambulatory Visit | Attending: General Practice | Admitting: General Practice

## 2013-11-07 DIAGNOSIS — M79661 Pain in right lower leg: Secondary | ICD-10-CM

## 2013-11-07 DIAGNOSIS — M7989 Other specified soft tissue disorders: Secondary | ICD-10-CM | POA: Diagnosis not present

## 2014-02-22 ENCOUNTER — Encounter (HOSPITAL_COMMUNITY): Payer: Self-pay | Admitting: Emergency Medicine

## 2014-02-22 ENCOUNTER — Emergency Department (HOSPITAL_COMMUNITY)
Admission: EM | Admit: 2014-02-22 | Discharge: 2014-02-22 | Disposition: A | Discharge status: Home / Self Care | Payer: BLUE CROSS/BLUE SHIELD | Attending: Emergency Medicine | Admitting: Emergency Medicine

## 2014-02-22 DIAGNOSIS — X58XXXA Exposure to other specified factors, initial encounter: Secondary | ICD-10-CM | POA: Diagnosis not present

## 2014-02-22 DIAGNOSIS — S8391XA Sprain of unspecified site of right knee, initial encounter: Secondary | ICD-10-CM

## 2014-02-22 DIAGNOSIS — Z872 Personal history of diseases of the skin and subcutaneous tissue: Secondary | ICD-10-CM | POA: Insufficient documentation

## 2014-02-22 DIAGNOSIS — E119 Type 2 diabetes mellitus without complications: Secondary | ICD-10-CM | POA: Diagnosis not present

## 2014-02-22 DIAGNOSIS — E669 Obesity, unspecified: Secondary | ICD-10-CM | POA: Insufficient documentation

## 2014-02-22 DIAGNOSIS — Z79899 Other long term (current) drug therapy: Secondary | ICD-10-CM | POA: Insufficient documentation

## 2014-02-22 DIAGNOSIS — Y9289 Other specified places as the place of occurrence of the external cause: Secondary | ICD-10-CM | POA: Diagnosis not present

## 2014-02-22 DIAGNOSIS — Z8659 Personal history of other mental and behavioral disorders: Secondary | ICD-10-CM | POA: Insufficient documentation

## 2014-02-22 DIAGNOSIS — Y9389 Activity, other specified: Secondary | ICD-10-CM | POA: Diagnosis not present

## 2014-02-22 DIAGNOSIS — I10 Essential (primary) hypertension: Secondary | ICD-10-CM | POA: Insufficient documentation

## 2014-02-22 DIAGNOSIS — Z880 Allergy status to penicillin: Secondary | ICD-10-CM | POA: Insufficient documentation

## 2014-02-22 DIAGNOSIS — Y998 Other external cause status: Secondary | ICD-10-CM | POA: Insufficient documentation

## 2014-02-22 DIAGNOSIS — S8991XA Unspecified injury of right lower leg, initial encounter: Secondary | ICD-10-CM | POA: Diagnosis present

## 2014-02-22 MED ORDER — IBUPROFEN 800 MG PO TABS: 800.0000 mg | ORAL_TABLET | Freq: Three times a day (TID) | ORAL | Status: DC

## 2014-02-22 MED ORDER — IBUPROFEN 800 MG PO TABS
800.0000 mg | ORAL_TABLET | Freq: Once | ORAL | Status: AC
  Administered 2014-02-22: 800 mg via ORAL
  Filled 2014-02-22: qty 1

## 2014-02-22 NOTE — Discharge Instructions (Signed)
Elastic Bandage and RICE °Elastic bandages come in different shapes and sizes. They perform different functions. Your caregiver will help you to decide what is best for your protection, recovery, or rehabilitation following an injury. The following are some general tips to help you use an elastic bandage. °· Use the bandage as directed by the maker of the bandage you are using. °· Do not wrap it too tight. This may cut off the circulation of the arm or leg below the bandage. °· If part of your body beyond the bandage becomes blue, numb, or swollen, it is too tight. Loosen the bandage as needed to prevent these problems. °· See your caregiver or trainer if the bandage seems to be making your problems worse rather than better. °Bandages may be a reminder to you that you have an injury. However, they provide very little support. The few pounds of support they provide are minor considering the pressure it takes to injure a joint or tear ligaments. Therefore, the joint will not be able to handle all of the wear and tear it could before the injury. °The routine care of many injuries includes Rest, Ice, Compression, and Elevation (RICE). °· Rest is required to allow your body to heal. Generally, routine activities can be resumed when comfortable. Injured tendons and bones take about 6 weeks to heal. °· Icing the injury helps keep the swelling down and reduces pain. Do not apply ice directly to the skin. Put ice in a plastic bag. Place a towel between the skin and the bag. This will prevent frostbite to the skin. Apply ice bags to the injured area for 15-20 minutes, every 2 hours while awake. Do this for the first 24 to 48 hours, then as directed by your caregiver. °· Compression helps keep swelling down, gives support, and helps with discomfort. If an elastic bandage has been applied today, it should be removed and reapplied every 3 to 4 hours. It should not be applied tightly, but firmly enough to keep swelling down.  Watch fingers or toes for swelling, bluish discoloration, coldness, numbness, or increased pain. If any of these problems occur, remove the bandage and reapply it more loosely. If these problems persist, contact your caregiver. °· Elevation helps reduce swelling and decreases pain. The injured area (arms, hands, legs, or feet) should be placed near to or above the heart (center of the chest) if able. °Persistent pain and inability to use the injured area for more than 2 to 3 days are warning signs. You should see a caregiver for a follow-up visit as soon as possible. Initially, a minor broken bone (hairline fracture) may not be seen on X-rays. It may take 7 to 10 days to finally show up. Continued pain and swelling show that further evaluation and/or X-rays are needed. Make a follow-up visit with your caregiver. A specialist in reading X-rays (radiologist) will read your X-rays again. °Finding out the results of your test °Not all test results are available during your visit. If your test results are not back during the visit, make an appointment with your caregiver to find out the results. Do not assume everything is normal if you have not heard from your caregiver or the medical facility. It is important for you to follow up on all of your test results. °Document Released: 07/05/2001 Document Revised: 04/07/2011 Document Reviewed: 05/17/2007 °[employer]® Patient Information ©2015 [employer]. This information is not intended to replace advice given to you by your health care provider. Make sure   you discuss any questions you have with your health care provider. ° °

## 2014-02-22 NOTE — ED Notes (Signed)
Pt states was walking outside of work yesterday when slipped on ice and coworker caught her but hyperextended her right knee. Now very painful to move.

## 2014-02-22 NOTE — ED Provider Notes (Signed)
CSN: [id number]     Arrival date & time 02/22/14  1102 History   First MD Initiated Contact with Patient 02/22/14 1105     No chief complaint on file.    (Consider location/radiation/quality/duration/timing/severity/associated sxs/prior Treatment) HPI   44 year old female with history of diabetes presents for evaluation of a recent fall. The patient states yesterday she was getting out of the car, slipped on ice and nearly fell to the ground but her coworker was able to stop her from falling. Sts her knee was twisted in the process.  She developed acute onset of sharp pain to the posterior aspects of her right knee. Pain is moderate, worsening with movement but she is able to ambulate. She did take several Tylenol yesterday with some relief. She denies any hip or ankle pain. She denies any other injuries. No prior injuries to the same knee. No numbness or weakness.  Past Medical History  Diagnosis Date  . Diabetes mellitus without complication   . Hypertension   . Migraines   . Depression   . Ulcer    Past Surgical History  Procedure Laterality Date  . Cesarean section  1993  . Tubal ligation  1993   Family History  Problem Relation Age of Onset  . Diabetes Mother   . Hypertension Mother   . Heart disease Mother   . Hyperlipidemia Mother   . Diabetes Father   . Hypertension Father   . Heart disease Father   . Hyperlipidemia Father   . Cervical cancer Sister    History  Substance Use Topics  . Smoking status: Never Smoker   . Smokeless tobacco: Never Used  . Alcohol Use: No   OB History    No data available     Review of Systems  Constitutional: Negative for fever.  Musculoskeletal: Positive for arthralgias.  Skin: Negative for rash and wound.  Neurological: Negative for numbness.      Allergies  Codeine; Morphine and related; Penicillins; Sudafed; Azithromycin; and Erythromycin  Home Medications   Prior to Admission medications   Medication Sig Start Date  End Date Taking? Authorizing Provider  acetaminophen (TYLENOL) 500 MG tablet Take 1,000 mg by mouth every 6 (six) hours as needed for moderate pain.    Historical Provider, MD  lisinopril-hydrochlorothiazide (PRINZIDE,ZESTORETIC) 10-12.5 MG per tablet Take 1 tablet by mouth daily before breakfast. 06/02/13   Janith Lima, MD  meclizine (ANTIVERT) 25 MG tablet Take 1 tablet (25 mg total) by mouth 3 (three) times daily as needed for dizziness. 06/02/13   Janith Lima, MD   There were no vitals taken for this visit. Physical Exam  Constitutional: She appears well-developed and well-nourished. No distress.  Moderately obese African-American female appears to be in no acute distress, able to ambulate.  HENT:  Head: Atraumatic.  Eyes: Conjunctivae are normal.  Neck: Neck supple.  Musculoskeletal: She exhibits tenderness (Right knee: Tenderness to the posterior fossa on palpation without gross deformity. Normal knee flexion and extension. Negative anterior and posterior drawer test, no pain with varus and valgus maneuver. Intact distal pulse with normal sensation. ).  Right hip and right ankle are nontender on exam.  Neurological: She is alert.  Skin: No rash noted.  Psychiatric: She has a normal mood and affect.  Nursing note and vitals reviewed.   ED Course  Procedures (including critical care time)  11:18 AM Patient injured her right knee on a near fall and slipped on ice. She hyperextended her knee and complaining  of pain to the back of her knee. She was able to ambulate afterward. I have low suspicion for acute fracture and discuss option of x-ray, patient declined. Suspect knee sprain. Ace wrapped provided, rice therapy discussed, orthopedic referral given as needed. Patient stable for discharge. She is neurovascular intact.  Labs Review Labs Reviewed - No data to display  Imaging Review No results found.   EKG Interpretation None      MDM   Final diagnoses:  Right knee  sprain, initial encounter    BP 130/83 mmHg  Pulse 90  Temp(Src) 98.1 F (36.7 C) (Oral)  Resp 20  SpO2 99%     Domenic Moras, PA-C 02/22/14 [place], MD 02/22/14 [phone]

## 2014-04-17 ENCOUNTER — Encounter: Payer: Self-pay | Admitting: Internal Medicine

## 2014-04-17 ENCOUNTER — Other Ambulatory Visit (INDEPENDENT_AMBULATORY_CARE_PROVIDER_SITE_OTHER): Payer: BLUE CROSS/BLUE SHIELD

## 2014-04-17 ENCOUNTER — Ambulatory Visit (INDEPENDENT_AMBULATORY_CARE_PROVIDER_SITE_OTHER): Payer: BLUE CROSS/BLUE SHIELD | Admitting: Internal Medicine

## 2014-04-17 VITALS — BP 116/78 | HR 68 | Temp 98.1°F | Resp 14 | Ht 60.0 in | Wt 291.4 lb

## 2014-04-17 DIAGNOSIS — M25561 Pain in right knee: Secondary | ICD-10-CM | POA: Insufficient documentation

## 2014-04-17 DIAGNOSIS — R7309 Other abnormal glucose: Secondary | ICD-10-CM

## 2014-04-17 DIAGNOSIS — R7303 Prediabetes: Secondary | ICD-10-CM

## 2014-04-17 DIAGNOSIS — I10 Essential (primary) hypertension: Secondary | ICD-10-CM

## 2014-04-17 LAB — COMPREHENSIVE METABOLIC PANEL
ALT: 11 U/L (ref 0–35)
AST: 13 U/L (ref 0–37)
Albumin: 3.8 g/dL (ref 3.5–5.2)
Alkaline Phosphatase: 59 U/L (ref 39–117)
BUN: 14 mg/dL (ref 6–23)
CO2: 29 mEq/L (ref 19–32)
Calcium: 8.9 mg/dL (ref 8.4–10.5)
Chloride: 105 mEq/L (ref 96–112)
Creatinine, Ser: 0.85 mg/dL (ref 0.40–1.20)
GFR: 94.57 mL/min (ref >60.00)
GLUCOSE: 116 mg/dL — AB (ref 70–99)
Potassium: 3.6 mEq/L (ref 3.5–5.1)
SODIUM: 139 meq/L (ref 135–145)
TOTAL PROTEIN: 7.2 g/dL (ref 6.0–8.3)
Total Bilirubin: 0.6 mg/dL (ref 0.2–1.2)

## 2014-04-17 LAB — LIPID PANEL
Cholesterol: 202 mg/dL — ABNORMAL HIGH (ref 0–200)
HDL: 53.3 mg/dL (ref >39.00)
LDL CALC: 128 mg/dL — AB (ref 0–99)
NONHDL: 148.7
TRIGLYCERIDES: 106 mg/dL (ref 0.0–149.0)
Total CHOL/HDL Ratio: 4
VLDL: 21.2 mg/dL (ref 0.0–40.0)

## 2014-04-17 LAB — HEMOGLOBIN A1C: Hgb A1c MFr Bld: 6.5 % (ref 4.6–6.5)

## 2014-04-17 MED ORDER — LISINOPRIL-HYDROCHLOROTHIAZIDE 10-12.5 MG PO TABS: 1.0000 | ORAL_TABLET | Freq: Every day | ORAL | Status: DC

## 2014-04-17 NOTE — Assessment & Plan Note (Addendum)
She does have several complications of her weight including pre-diabetes, HTN. Hopefully her knee will heal quickly as she needs to keep moving. Reminded her about healthy eating and portion control since she is not as able to be active right now. She will have MRI. Reminded that every pound she loses (or gain) takes away (or adds) 4 pounds of pressure to her knee. Check lipid panel and HgA1c.

## 2014-04-17 NOTE — Assessment & Plan Note (Signed)
She is going through [employer] and is getting an MRI soon. About 2 months out from injury. She does admit to instability of the knee, swelling with activity, and pain. She is taking mobic and being helped by that for now.

## 2014-04-17 NOTE — Assessment & Plan Note (Signed)
Checking HgA1c today. If she is still >6 it may be worthwhile to add glp-1 to see if this helps with her weight loss.

## 2014-04-17 NOTE — Progress Notes (Signed)
   Subjective:    Patient ID: Lynn Fields, female    DOB: August 31, 1970, 44 y.o.   MRN: [id number]  HPI The patient is a 44 YO female who is coming in to follow up on her blood pressure and her weight. She has still been working on losing weight but is up about 9 pounds since last visit. She had a knee injury in January which has really set her back. 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 She is supposed to get it this week. She has been off work and unable to exercise since then. Does not think her diet has changed. She is extra stressed now due to being off work. Still following with the weight loss clinic and goes back tomorrow.    Review of Systems  Constitutional: Positive for activity change. Negative for fever, appetite change and fatigue.       Exercising less  HENT: Negative.   Eyes: Negative.   Respiratory: Negative for cough, chest tightness, shortness of breath and wheezing.   Cardiovascular: Negative for chest pain, palpitations and leg swelling.  Gastrointestinal: Negative for abdominal pain, diarrhea, constipation and abdominal distention.  Genitourinary: Negative.   Musculoskeletal: Positive for arthralgias and gait problem.  Skin: Negative.   Neurological: Negative for weakness, light-headedness and headaches.  Psychiatric/Behavioral: Negative.       Objective:   Physical Exam  Constitutional: She is oriented to person, place, and time. She appears well-developed and well-nourished.  Overweight  HENT:  Head: Normocephalic and atraumatic.  Eyes: EOM are normal.  Neck: Normal range of motion.  Cardiovascular: Normal rate and regular rhythm.   Pulmonary/Chest: Effort normal and breath sounds normal. No respiratory distress. She has no wheezes. She has no rales.  Abdominal: Soft. Bowel sounds are normal.  Musculoskeletal: Normal range of motion. She exhibits tenderness.  Right knee tender  Neurological: She is alert and oriented to  person, place, and time. Coordination normal.  Skin: Skin is warm and dry.  Psychiatric: She has a normal mood and affect.   Filed Vitals:   04/17/14 0832  BP: 116/78  Pulse: 68  Temp: 98.1 F (36.7 C)  TempSrc: Oral  Resp: 14  Height: 5' (1.524 m)  Weight: 291 lb 6.4 oz (132.178 kg)  SpO2: 98%      Assessment & Plan:

## 2014-04-17 NOTE — Patient Instructions (Signed)
We will check on the blood work today and call you back with the results. Let us know if there is anything for the knee we can do to help.   Keep up the hard work with the weight to work on taking some pressure off the knees. Every 1 pound you lose takes 4 pounds of pressure off the knees.   Serving Sizes What we call a serving size today is larger than it was in the past. A 1950s fast-food burger contained little more than 1 oz of meat, and a soft drink was 8 oz (1 cup). Today, a "quarter pounder" burger is at least 4 times that amount, and a 32 or 64 oz drink is not uncommon. A possible guide for eating when trying to lose weight is to eat about half as much as you normally do. Some estimates of serving sizes are:  1 Dairy serving:Individual container of yogurt (8 oz) or piece of cheese the size of your thumb (1 oz).  1 Grain serving: 1 slice of bread or  cup pasta.  1 Meat serving: The size of a deck of cards (3 oz).  1 Fruit serving: cup canned fruit or 1 medium fruit.  1 Vegetable serving:  cup of cooked or canned vegetables.  1 Fat serving:The size of 4 stacked dimes. Experts suggest spending 1 or 2 days measuring food portions you commonly eat. This will give you better practice at estimating serving sizes, and will also show whether you are eating an appropriate amount of food to meet your weight goals. If you find that you are eating more than you thought, try measuring your food for a few days so you can "reprogram" yourself to learn what makes a healthy portion for you. SUGGESTIONS FOR CONTROL  In restaurants, share entrees, or ask the waiter to put half the entre in a box or bag before you even touch it.  Order lunch-sized portions. Many restaurants serve 4 to 6 oz of meat at lunch, compared with 8 to 10 oz at dinner.  Split dessert or skip it all together. Have a piece of fruit when you get home.  At home, use smaller plates and bowls. It will look as if you are eating  more.  Plate your food in the kitchen rather than serving it "family style" at the table.  Wait 20 to 30 minutes before taking seconds. This is how long it takes your brain to recognize that you are full.  Check food labels for serving sizes. Eat 1 serving only.  Use measuring cups and spoons to see proper serving sizes.  Buy smaller packages of candy, popcorn, and snacks.  Avoid eating directly out of the bag or carton.  While eating half as much, exercise twice as much. Park further away from the mall, take the stairs instead of the escalator, and walk around your block. Losing weight is a slow, difficult process. It takes long-lasting lifestyle changes. You can make gradual changes over time so they become habits. Look to friends and family to support the healthy changes you are making. Avoid fad diets since they are often only temporary weight loss solutions. Document Released: 10/12/2002 Document Revised: 04/07/2011 Document Reviewed: 04/12/2013 [facility] Patient Information 2015 [place], [place]. This information is not intended to replace advice given to you by your health care provider. Make sure you discuss any questions you have with your health care provider.

## 2014-04-17 NOTE — Assessment & Plan Note (Addendum)
BP doing great today and we reminded her that is she is able to lose and keep off weight we may be able to decrease her medicines and hopefully this will help to be a carrot for her weight loss goals. Check labs today.

## 2014-04-17 NOTE — Progress Notes (Signed)
Pre visit review using our clinic review tool, if applicable. No additional management support is needed unless otherwise documented below in the visit note. 

## 2014-04-18 ENCOUNTER — Other Ambulatory Visit: Payer: Self-pay | Admitting: Internal Medicine

## 2014-04-18 MED ORDER — METFORMIN HCL 500 MG PO TABS: 500.0000 mg | ORAL_TABLET | Freq: Two times a day (BID) | ORAL | Status: DC

## 2014-05-31 ENCOUNTER — Other Ambulatory Visit (HOSPITAL_COMMUNITY): Payer: Self-pay | Admitting: Orthopedic Surgery

## 2014-05-31 DIAGNOSIS — M25561 Pain in right knee: Secondary | ICD-10-CM

## 2014-06-06 ENCOUNTER — Ambulatory Visit (HOSPITAL_COMMUNITY)
Admission: RE | Admit: 2014-06-06 | Discharge: 2014-06-06 | Disposition: A | Discharge status: Home / Self Care | Payer: BLUE CROSS/BLUE SHIELD | Source: Ambulatory Visit | Attending: Orthopedic Surgery | Admitting: Orthopedic Surgery

## 2014-06-06 DIAGNOSIS — R262 Difficulty in walking, not elsewhere classified: Secondary | ICD-10-CM | POA: Insufficient documentation

## 2014-06-06 DIAGNOSIS — M25561 Pain in right knee: Secondary | ICD-10-CM | POA: Insufficient documentation

## 2014-06-06 DIAGNOSIS — R531 Weakness: Secondary | ICD-10-CM | POA: Diagnosis not present

## 2014-06-09 ENCOUNTER — Ambulatory Visit (HOSPITAL_COMMUNITY): Payer: BLUE CROSS/BLUE SHIELD

## 2014-06-19 ENCOUNTER — Encounter: Payer: Self-pay | Admitting: Internal Medicine

## 2014-06-19 MED ORDER — CITALOPRAM HYDROBROMIDE 10 MG PO TABS: 10.0000 mg | ORAL_TABLET | Freq: Every day | ORAL | Status: DC

## 2014-06-20 ENCOUNTER — Ambulatory Visit (INDEPENDENT_AMBULATORY_CARE_PROVIDER_SITE_OTHER): Payer: BLUE CROSS/BLUE SHIELD | Admitting: Internal Medicine

## 2014-06-20 ENCOUNTER — Other Ambulatory Visit (INDEPENDENT_AMBULATORY_CARE_PROVIDER_SITE_OTHER): Payer: BLUE CROSS/BLUE SHIELD

## 2014-06-20 ENCOUNTER — Encounter: Payer: Self-pay | Admitting: Internal Medicine

## 2014-06-20 VITALS — BP 124/72 | HR 87 | Temp 98.2°F | Resp 14 | Wt 299.0 lb

## 2014-06-20 DIAGNOSIS — I10 Essential (primary) hypertension: Secondary | ICD-10-CM | POA: Diagnosis not present

## 2014-06-20 DIAGNOSIS — M25561 Pain in right knee: Secondary | ICD-10-CM

## 2014-06-20 DIAGNOSIS — E119 Type 2 diabetes mellitus without complications: Secondary | ICD-10-CM

## 2014-06-20 LAB — BASIC METABOLIC PANEL
BUN: 10 mg/dL (ref 6–23)
CO2: 28 meq/L (ref 19–32)
Calcium: 9.1 mg/dL (ref 8.4–10.5)
Chloride: 105 mEq/L (ref 96–112)
Creatinine, Ser: 0.71 mg/dL (ref 0.40–1.20)
GFR: 116.3 mL/min (ref >60.00)
Glucose, Bld: 175 mg/dL — ABNORMAL HIGH (ref 70–99)
Potassium: 3.9 mEq/L (ref 3.5–5.1)
Sodium: 138 mEq/L (ref 135–145)

## 2014-06-20 LAB — HEMOGLOBIN A1C: HEMOGLOBIN A1C: 5.9 % (ref 4.6–6.5)

## 2014-06-20 MED ORDER — CYCLOBENZAPRINE HCL 10 MG PO TABS: 10.0000 mg | ORAL_TABLET | Freq: Three times a day (TID) | ORAL | Status: DC | PRN

## 2014-06-20 NOTE — Assessment & Plan Note (Signed)
Will have her see sports medicine. Can continue with the meloxicam. Trial flexeril for pain. No ligaments torn on MRI per her reports. Talked to her again about the importance of weight loss. Since she is in pain she is not able to work which is likely causing her stress and not helping with her goals of weight loss.

## 2014-06-20 NOTE — Assessment & Plan Note (Signed)
BP still well controlled on lisinopril/hctz. Checking BMP today. Adjust as needed.

## 2014-06-20 NOTE — Patient Instructions (Signed)
We will check your labs today and call you back with the results.   We will try a different medicine called flexeril for the knee pain and get you in with the sports medicine doctor to see what else can be done or if a brace would be helpful.   Diabetes and Exercise Exercising regularly is important. It is not just about losing weight. It has many health benefits, such as:  Improving your overall fitness, flexibility, and endurance.  Increasing your bone density.  Helping with weight control.  Decreasing your body fat.  Increasing your muscle strength.  Reducing stress and tension.  Improving your overall health. People with diabetes who exercise gain additional benefits because exercise:  Reduces appetite.  Improves the body's use of blood sugar (glucose).  Helps lower or control blood glucose.  Decreases blood pressure.  Helps control blood lipids (such as cholesterol and triglycerides).  Improves the body's use of the hormone insulin by:  Increasing the body's insulin sensitivity.  Reducing the body's insulin needs.  Decreases the risk for heart disease because exercising:  Lowers cholesterol and triglycerides levels.  Increases the levels of good cholesterol (such as high-density lipoproteins [HDL]) in the body.  Lowers blood glucose levels. YOUR ACTIVITY PLAN  Choose an activity that you enjoy and set realistic goals. Your health care provider or diabetes educator can help you make an activity plan that works for you. Exercise regularly as directed by your health care provider. This includes:  Performing resistance training twice a week such as push-ups, sit-ups, lifting weights, or using resistance bands.  Performing 150 minutes of cardio exercises each week such as walking, running, or playing sports.  Staying active and spending no more than 90 minutes at one time being inactive. Even short bursts of exercise are good for you. Three 10-minute sessions  spread throughout the day are just as beneficial as a single 30-minute session. Some exercise ideas include:  Taking the dog for a walk.  Taking the stairs instead of the elevator.  Dancing to your favorite song.  Doing an exercise video.  Doing your favorite exercise with a friend. RECOMMENDATIONS FOR EXERCISING WITH TYPE 1 OR TYPE 2 DIABETES   Check your blood glucose before exercising. If blood glucose levels are greater than 240 mg/dL, check for urine ketones. Do not exercise if ketones are present.  Avoid injecting insulin into areas of the body that are going to be exercised. For example, avoid injecting insulin into:  The arms when playing tennis.  The legs when jogging.  Keep a record of:  Food intake before and after you exercise.  Expected peak times of insulin action.  Blood glucose levels before and after you exercise.  The type and amount of exercise you have done.  Review your records with your health care provider. Your health care provider will help you to develop guidelines for adjusting food intake and insulin amounts before and after exercising.  If you take insulin or oral hypoglycemic agents, watch for signs and symptoms of hypoglycemia. They include:  Dizziness.  Shaking.  Sweating.  Chills.  Confusion.  Drink plenty of water while you exercise to prevent dehydration or heat stroke. Body water is lost during exercise and must be replaced.  Talk to your health care provider before starting an exercise program to make sure it is safe for you. Remember, almost any type of activity is better than none. Document Released: 04/05/2003 Document Revised: 05/30/2013 Document Reviewed: 06/22/2012 [employer] Patient Information 2015  [employer]. This information is not intended to replace advice given to you by your health care provider. Make sure you discuss any questions you have with your health care provider.

## 2014-06-20 NOTE — Progress Notes (Signed)
Pre visit review using our clinic review tool, if applicable. No additional management support is needed unless otherwise documented below in the visit note. 

## 2014-06-20 NOTE — Assessment & Plan Note (Signed)
New diagnosis, up to date on eye exam. She is on ace-I already. Taking metformin 500 mg daily and will recheck HgA1c today. No complications noted.

## 2014-06-20 NOTE — Progress Notes (Signed)
   Subjective:    Patient ID: Lynn Fields, female    DOB: Jan 08, 1971, 44 y.o.   MRN: [id number]  HPI The patient is a 44 YO female who is coming in with right knee pain. She finally got her MRI which di d not reveal any tears of the ligaments. She was then released from that doctor. She is still having significant pain which keeps her from doing things. Has not collapsed on her. No swelling. She also has started metformin for her new diabetes. She has been taking it and had a low sugar of 58 from taking it twice a day. She has gone back to once a day and is doing well. No weight loss. Denies significant diarrhea (although some at the beginning that is gone now). Has not been able to exercise with the knee pain. Had eye exam this month which was negative for signs of diabetes but she is getting bifocals.   Review of Systems  Constitutional: Positive for activity change. Negative for fever, appetite change and fatigue.       Exercising less  Respiratory: Negative for cough, chest tightness, shortness of breath and wheezing.   Cardiovascular: Negative for chest pain, palpitations and leg swelling.  Gastrointestinal: Negative for abdominal pain, diarrhea, constipation and abdominal distention.  Musculoskeletal: Positive for arthralgias and gait problem.  Skin: Negative.   Neurological: Negative for weakness, light-headedness and headaches.      Objective:   Physical Exam  Constitutional: She is oriented to person, place, and time. She appears well-developed and well-nourished.  Overweight  HENT:  Head: Normocephalic and atraumatic.  Eyes: EOM are normal.  Neck: Normal range of motion.  Cardiovascular: Normal rate and regular rhythm.   Pulmonary/Chest: Effort normal and breath sounds normal. No respiratory distress. She has no wheezes. She has no rales.  Abdominal: Soft. Bowel sounds are normal.  Musculoskeletal: Normal range of motion. She exhibits tenderness.  Right knee tender    Neurological: She is alert and oriented to person, place, and time. Coordination normal.  Skin: Skin is warm and dry.  Psychiatric: She has a normal mood and affect.   Filed Vitals:   06/20/14 0959  BP: 124/72  Pulse: 87  Temp: 98.2 F (36.8 C)  TempSrc: Oral  Resp: 14  Weight: 299 lb (135.626 kg)  SpO2: 97%      Assessment & Plan:

## 2014-07-05 ENCOUNTER — Encounter: Payer: Self-pay | Admitting: Family Medicine

## 2014-07-05 ENCOUNTER — Ambulatory Visit (INDEPENDENT_AMBULATORY_CARE_PROVIDER_SITE_OTHER): Payer: BLUE CROSS/BLUE SHIELD | Admitting: Family Medicine

## 2014-07-05 ENCOUNTER — Other Ambulatory Visit (INDEPENDENT_AMBULATORY_CARE_PROVIDER_SITE_OTHER): Payer: BLUE CROSS/BLUE SHIELD

## 2014-07-05 VITALS — BP 122/80 | HR 76 | Ht 60.0 in | Wt 296.0 lb

## 2014-07-05 DIAGNOSIS — IMO0002 Reserved for concepts with insufficient information to code with codable children: Secondary | ICD-10-CM | POA: Insufficient documentation

## 2014-07-05 DIAGNOSIS — M25561 Pain in right knee: Secondary | ICD-10-CM

## 2014-07-05 DIAGNOSIS — M129 Arthropathy, unspecified: Secondary | ICD-10-CM

## 2014-07-05 MED ORDER — DICLOFENAC SODIUM 2 % TD SOLN: TRANSDERMAL | Status: DC

## 2014-07-05 NOTE — Progress Notes (Signed)
[name] [address] [place], [place] [address] Phone: [phone] Subjective:    I'm seeing this patient by the request  of:  Olga Millers, MD   CC: Right knee pain  IRS:[id number] Naika Noto is a 44 y.o. female coming in with complaint of right knee pain. Patient has had this pain for multiple months. Patient's primary care provider who did get x-rays. These x-rays are reviewed by me and showed patient does have moderate osteophytic changes for her age as well as mild patellofemoral with lateral tracking. Patient states she has been working out more regularly and has lost 30 pounds this year. Patient was continuing to work out but unfortunately her knee is starting to give her pain. Patient even states with regular daily activities such as walking long distances she has more pain. As it as a dull throbbing aching radiation down the leg or any instability. Patient states though that going up or down stairs can be very painful and she can have some discomfort at night. Rates the severity of pain is 8 out of 10. Has not responded to over-the-counter medications.     Past Medical History  Diagnosis Date  . Diabetes mellitus without complication   . Hypertension   . Migraines   . Depression   . Ulcer    Past Surgical History  Procedure Laterality Date  . Cesarean section  1993  . Tubal ligation  1993   History  Substance Use Topics  . Smoking status: Never Smoker   . Smokeless tobacco: Never Used  . Alcohol Use: No   Allergies  Allergen Reactions  . Codeine Itching  . Morphine And Related Itching  . Penicillins Swelling  . Sudafed [Pseudoephedrine Hcl] Swelling  . Azithromycin Rash  . Erythromycin Rash     Past medical history, social, surgical and family history all reviewed in electronic medical record.   Review of Systems: No headache, visual changes, nausea, vomiting, diarrhea, constipation, dizziness, abdominal pain, skin  rash, fevers, chills, night sweats, weight loss, swollen lymph nodes, body aches, joint swelling, muscle aches, chest pain, shortness of breath, mood changes.   Objective Blood pressure 122/80, pulse 76, height 5' (1.524 m), weight 296 lb (134.265 kg), last menstrual period 05/03/2014.  General: No apparent distress alert and oriented x3 mood and affect normal, dressed appropriately.  HEENT: Pupils equal, extraocular movements intact  Respiratory: Patient's speak in full sentences and does not appear short of breath  Cardiovascular: No lower extremity edema, non tender, no erythema  Skin: Warm dry intact with no signs of infection or rash on extremities or on axial skeleton.  Abdomen: Soft nontender  Neuro: Cranial nerves II through XII are intact, neurovascularly intact in all extremities with 2+ DTRs and 2+ pulses.  Lymph: No lymphadenopathy of posterior or anterior cervical chain or axillae bilaterally.  Gait normal with good balance and coordination.  MSK:  Non tender with full range of motion and good stability and symmetric strength and tone of shoulders, elbows, wrist, hip, and ankles bilaterally.  Knee: Right Difficult to assess secondary to patient's body habitus Tender to palpation over the medial joint line ROM full in flexion and extension and lower leg rotation. Ligaments with solid consistent endpoints including ACL, PCL, LCL, MCL. Negative Mcmurray's, Apley's, and Thessalonian tests. Non painful patellar compression. Patellar glide without crepitus. Patellar and quadriceps tendons unremarkable. Hamstring and quadriceps strength is normal.   MSK US performed of: Right knee This study was  ordered, performed, and interpreted by Charlann Boxer D.O.  Knee: All structures visualized. Moderate narrowing of the medial joint line and no specific meniscal pathology. Patellar Tendon unremarkable on long and transverse views without effusion. No abnormality of prepatellar bursa. LCL  and MCL unremarkable on long and transverse views. No abnormality of origin of medial or lateral head of the gastrocnemius.  IMPRESSION:  Moderate osteophytic changes of the medial compartment.  Procedure note 53299; 15 minutes spent for Therapeutic exercises as stated in above notes.  This included exercises focusing on stretching, strengthening, with significant focus on eccentric aspects.Patellofemoral Syndrome Rehab  Isometric contractions of thigh - 10 x 10 secs  3 way straight leg raises - build to 3 sets of 30 and then add weights begin with no weight. When 3 x 30 reached, Add 2 lb. ankle weight. Increase to 3,4,5,6 when 3x30 achieved.  Drop squats - limit to 45 deg, 3x15  Modified lunge - running position, 3x15  Seated quad extensions, 3x15, add ankle weights  Step downs, 3x15 with body weight slowly on downward phase  Knee up and open hip: knee up and externally rotate hip to open position, hold 2 sec and repeat each leg, 30 reps  Cone Drills: Right Leg, Right Hand Right Leg, Left Hand Left Leg, Right Hand Left Leg, Left Hand Start with 1 cone, progress to 3 20 each exercise  Lateral Leg Reach Balance knee, reach out laterally to cone and touch. Hold at Knee up position for 2 seconds 20 reps each leg  Rear Leg Reach Directly behind - place cone 20 reps    Proper technique shown and discussed handout in great detail with ATC.  All questions were discussed and answered.        Impression and Recommendations:     This case required medical decision making of moderate complexity.

## 2014-07-05 NOTE — Assessment & Plan Note (Signed)
Dupuy that someone patient's pain is secondary to the moderate arthritis in her knee. We discussed slowing down the progression with weight loss, over-the-counter natural supplementation patient given a trial of topical anti-inflammatory's. We discussed proper shoewear and patient did work with Product/process development scientist today to learn home exercises in greater detail. Patient will make these changes and come back and see me again in 3-4 weeks. Continued have pain we'll consider injection.

## 2014-07-05 NOTE — Patient Instructions (Addendum)
Good to see you.  Ice 20 minutes 2 times daily. Usually after activity and before bed. Exercises 3 times a week.  pennsaid pinkie amount topically 2 times daily as needed.  Vitamin D 2000 IU daily.  Glucosamine 1500mg  daily to slow down progression of arthritis.  Turmeric 500mg  twice daily See me again in 3 weeks and lets see how you are doing.

## 2014-07-05 NOTE — Progress Notes (Signed)
Pre visit review using our clinic review tool, if applicable. No additional management support is needed unless otherwise documented below in the visit note. 

## 2014-07-24 ENCOUNTER — Other Ambulatory Visit: Payer: Self-pay

## 2014-07-26 ENCOUNTER — Ambulatory Visit (INDEPENDENT_AMBULATORY_CARE_PROVIDER_SITE_OTHER): Payer: BLUE CROSS/BLUE SHIELD | Admitting: Family Medicine

## 2014-07-26 ENCOUNTER — Other Ambulatory Visit (INDEPENDENT_AMBULATORY_CARE_PROVIDER_SITE_OTHER): Payer: BLUE CROSS/BLUE SHIELD

## 2014-07-26 ENCOUNTER — Encounter: Payer: Self-pay | Admitting: Family Medicine

## 2014-07-26 VITALS — BP 112/84 | HR 71 | Ht 60.0 in | Wt 301.0 lb

## 2014-07-26 DIAGNOSIS — M129 Arthropathy, unspecified: Secondary | ICD-10-CM | POA: Diagnosis not present

## 2014-07-26 DIAGNOSIS — M25561 Pain in right knee: Secondary | ICD-10-CM | POA: Diagnosis not present

## 2014-07-26 DIAGNOSIS — IMO0002 Reserved for concepts with insufficient information to code with codable children: Secondary | ICD-10-CM

## 2014-07-26 NOTE — Assessment & Plan Note (Signed)
Patient was given an injection today and tolerated the procedure well. We discussed icing regimen and home exercises. Patient with continue with the topical anti-inflammatory. Patient was referred to formal physical therapy that I think will be beneficial. Encourage weight loss which will make a significant improvement. Patient and will come back and see me again in 4 weeks. Patient continues to have pain she could be a candidate for viscous supplementation.

## 2014-07-26 NOTE — Progress Notes (Signed)
[name] [address] [place], [place] [address] Phone: [phone] Subjective:    I'm seeing this patient by the request  of:  Olga Millers, MD   CC: Right knee pain follow-up  ERD:[id number] Lynn Fields is a 44 y.o. female coming in with complaint of right knee pain. Patient was found to have moderate arthritis in the knee. Patient did have a fall that did seem to exacerbate this problem. Patient states that he her knee is still hurting. Patient has been trying to do the exercises as well as wearing good shoes fairly regularly. Patient to the topical anti-inflammatory somewhat better. Patient denies any clicking popping or giving out on her. Patient though is having difficulty even doing some of her daily activities secondary to the pain.     Past Medical History  Diagnosis Date  . Diabetes mellitus without complication   . Hypertension   . Migraines   . Depression   . Ulcer    Past Surgical History  Procedure Laterality Date  . Cesarean section  1993  . Tubal ligation  1993   History  Substance Use Topics  . Smoking status: Never Smoker   . Smokeless tobacco: Never Used  . Alcohol Use: No   Allergies  Allergen Reactions  . Codeine Itching  . Morphine And Related Itching  . Penicillins Swelling  . Sudafed [Pseudoephedrine Hcl] Swelling  . Azithromycin Rash  . Erythromycin Rash     Past medical history, social, surgical and family history all reviewed in electronic medical record.   Review of Systems: No headache, visual changes, nausea, vomiting, diarrhea, constipation, dizziness, abdominal pain, skin rash, fevers, chills, night sweats, weight loss, swollen lymph nodes, body aches, joint swelling, muscle aches, chest pain, shortness of breath, mood changes.   Objective Blood pressure 112/84, pulse 71, height 5' (1.524 m), weight 301 lb (136.533 kg), SpO2 98 %.  General: No apparent distress alert and oriented x3 mood and  affect normal, dressed appropriately.  HEENT: Pupils equal, extraocular movements intact  Respiratory: Patient's speak in full sentences and does not appear short of breath  Cardiovascular: No lower extremity edema, non tender, no erythema  Skin: Warm dry intact with no signs of infection or rash on extremities or on axial skeleton.  Abdomen: Soft nontender  Neuro: Cranial nerves II through XII are intact, neurovascularly intact in all extremities with 2+ DTRs and 2+ pulses.  Lymph: No lymphadenopathy of posterior or anterior cervical chain or axillae bilaterally.  Gait normal with good balance and coordination.  MSK:  Non tender with full range of motion and good stability and symmetric strength and tone of shoulders, elbows, wrist, hip, and ankles bilaterally.  Knee: Right Difficult to assess secondary to patient's body habitus Tender to palpation over the medial joint linestill present ROM full in flexion and extension and lower leg rotation. Ligaments with solid consistent endpoints including ACL, PCL, LCL, MCL. Negative Mcmurray's, Apley's, and Thessalonian tests. mild painful patellar compression. Patellar glide with mild crepitus. Patellar and quadriceps tendons unremarkable. Hamstring and quadriceps strength is normal.    Procedure: Real-time Ultrasound Guided Injection of right knee Device: GE Logiq E  Ultrasound guided injection is preferred based studies that show increased duration, increased effect, greater accuracy, decreased procedural pain, increased response rate, and decreased cost with ultrasound guided versus blind injection.  Verbal informed consent obtained.  Time-out conducted.  Noted no overlying erythema, induration, or other signs of local infection.  Skin prepped  in a sterile fashion.  Local anesthesia: Topical Ethyl chloride.  With sterile technique and under real time ultrasound guidance: With a 22-gauge 2 inch needle patient was injected with 4 cc of 0.5%  Marcaine and 1 cc of Kenalog 40 mg/dL. This was from a superior lateral approach.  Completed without difficulty  Pain immediately resolved suggesting accurate placement of the medication.  Advised to call if fevers/chills, erythema, induration, drainage, or persistent bleeding.  Images permanently stored and available for review in the ultrasound unit.  Impression: Technically successful ultrasound guided injection.           Impression and Recommendations:     This case required medical decision making of moderate complexity.

## 2014-07-26 NOTE — Progress Notes (Signed)
Pre visit review using our clinic review tool, if applicable. No additional management support is needed unless otherwise documented below in the visit note. 

## 2014-07-26 NOTE — Patient Instructions (Signed)
Good to see you Continue the exercises Ice is your friend Stay active Physical therapy will be calling See me again in 4 weeks.

## 2014-08-08 ENCOUNTER — Encounter: Payer: Self-pay | Admitting: Family Medicine

## 2014-08-08 DIAGNOSIS — IMO0002 Reserved for concepts with insufficient information to code with codable children: Secondary | ICD-10-CM

## 2014-08-23 ENCOUNTER — Ambulatory Visit: Payer: BLUE CROSS/BLUE SHIELD | Attending: Family Medicine | Admitting: Physical Therapy

## 2014-08-23 ENCOUNTER — Encounter: Payer: Self-pay | Admitting: Physical Therapy

## 2014-08-23 ENCOUNTER — Ambulatory Visit: Payer: BLUE CROSS/BLUE SHIELD | Admitting: Family Medicine

## 2014-08-23 DIAGNOSIS — M179 Osteoarthritis of knee, unspecified: Secondary | ICD-10-CM | POA: Diagnosis present

## 2014-08-23 DIAGNOSIS — S8981XS Other specified injuries of right lower leg, sequela: Secondary | ICD-10-CM | POA: Diagnosis present

## 2014-08-23 DIAGNOSIS — M1711 Unilateral primary osteoarthritis, right knee: Secondary | ICD-10-CM

## 2014-08-23 DIAGNOSIS — M25561 Pain in right knee: Secondary | ICD-10-CM | POA: Diagnosis present

## 2014-08-23 DIAGNOSIS — X58XXXS Exposure to other specified factors, sequela: Secondary | ICD-10-CM | POA: Diagnosis not present

## 2014-08-23 NOTE — Therapy (Signed)
[facility] [place], [place], [address] Phone: [phone]   Fax:  [phone]  Physical Therapy Evaluation  Patient Details  Name: Lynn Fields MRN: [id number] Date of Birth: [birth year] Referring Provider:  Lyndal Pulley, DO  Encounter Date: 08/23/2014      PT End of Session - 08/23/14 1329    Visit Number 1   Number of Visits 16   Date for PT Re-Evaluation 10/18/14   PT Start Time 1155   PT Stop Time 1245   PT Time Calculation (min) 50 min   Activity Tolerance Patient tolerated treatment well;Patient limited by pain   Behavior During Therapy [facility] for tasks assessed/performed      Past Medical History  Diagnosis Date  . Diabetes mellitus without complication   . Hypertension   . Migraines   . Depression   . Ulcer     Past Surgical History  Procedure Laterality Date  . Cesarean section  1993  . Tubal ligation  1993    There were no vitals filed for this visit.  Visit Diagnosis:  Knee hyperextension injury, right, sequela  Osteoarthritis of right knee, unspecified osteoarthritis type  Knee joint pain, right      Subjective Assessment - 08/23/14 1411    Subjective Pt sustained a fall in the January snow this year. She was stepping out of her truck and slipped resulting in hyperextension of her knee upon falling. She saw her doctor and  an orthopedic surgeon and had MRI. She stated nothing abnormal was seen on MRI. She has recieved an injection in her knee.            [facility] PT Assessment - 08/23/14 0001    Prior Function   Level of Independence Independent   Vocation Other (comment)  no longer working since the fall   Cognition   Overall Cognitive Status Within Functional Limits for tasks assessed   Observation/Other Assessments-Edema    Edema Circumferential   Circumferential Edema   Circumferential - Right above patella 26in, mid patella 19, below patella 21.5in  L knee 25.5, 10, 21  respectively   ROM / Strength   AROM / PROM / Strength AROM;PROM;Strength   AROM   Overall AROM  Deficits;Due to pain   Overall AROM Comments extension lag of 30 degrees in R knee   AROM Assessment Site Hip;Knee   Right/Left Hip Right;Left  SLR R 55  L. 65   Right/Left Knee Right;Left   Right Knee Extension -7   Right Knee Flexion 100  supine limited by pain   Left Knee Extension -7   Left Knee Flexion 105  in supine   PROM   Overall PROM  Deficits;Due to pain   PROM Assessment Site Knee   Right/Left Knee Right   Strength   Overall Strength Deficits;Due to pain   Strength Assessment Site Hip;Knee;Ankle   Right/Left Hip Right;Left   Right Hip Flexion 4/5   Left Hip Flexion 4+/5   Right/Left Knee Right;Left   Right Knee Flexion 4/5   Right Knee Extension 4-/5   Left Knee Flexion 5/5   Left Knee Extension 5/5   Right/Left Ankle Right;Left   Right Ankle Dorsiflexion 4/5   Right Ankle Plantar Flexion 5/5   Left Ankle Dorsiflexion 5/5   Left Ankle Plantar Flexion 5/5   Right Knee   Right Knee Flexion 120   Palpation   Patella mobility R hypomobile compared to the l   Special Tests  Special Tests Knee Special Tests   Knee Special tests  Patellofemoral Grind Test (Clarke's Sign)   Patellofemoral Grind test (Clark's Sign)   Findings Postive   Side  Right   Ambulation/Gait   Ambulation/Gait Yes   Ambulation/Gait Assistance 7: Independent   Gait Comments decreased step length and stride   Functional Gait  Assessment   Gait assessed  --                   [facility] Adult PT Treatment/Exercise - 08/23/14 0001    Exercises   Exercises Knee/Hip   Knee/Hip Exercises: Supine   Quad Sets Strengthening;Right;1 set;10 reps;Limitations   Quad Sets Limitations some pain  instructed to use towel roll under (hyperextension)                PT Education - 08/23/14 1326    Education provided Yes   Education Details ice with swelling instead of heat, only quad  sets at this time with towel roll under knee due to hyperextension, stairs (good foot going up, bad foot going down),    Person(s) Educated Patient   Methods Explanation;Demonstration;Handout   Comprehension Verbalized understanding;Returned demonstration          PT Short Term Goals - 08/23/14 1343    PT SHORT TERM GOAL #1   Title I with initial HEP   Time 1   Period Weeks   Status New   PT SHORT TERM GOAL #2   Title report a decrease in pain at rest from 6/10 to 3/10   Time 4   Period Weeks   Status New   PT SHORT TERM GOAL #3   Title Demonstrate and verbalize understanding of condition management including RICE, positioning to prevent hyperextension at rest, and HEP   Time 4   Period Weeks   PT SHORT TERM GOAL #4   Title Pt reports ability to participate in her standing ab workout video for 26min without increased pain   Time 4   Period Weeks   Status New   PT SHORT TERM GOAL #5   Title pt demonstrates and verbalizes approprite gait sequence up and down curbs/stiars to alleviate increases in knee pain   Time 4   Period Weeks   Status New   Additional Short Term Goals   Additional Short Term Goals Yes   PT SHORT TERM GOAL #6   Title Patient will demo increased strength in Hip/knee motions to 4+/5   Time 4   Period Weeks   Status New           PT Long Term Goals - 08/23/14 1349    PT LONG TERM GOAL #1   Title Pt is able to tolerate elliptical/TM 32minutes without increased knee pain in order to workout again at home   Time 8   Period Weeks   Status New   PT LONG TERM GOAL #2   Title Pt reports ability to walk 2 hours in order to go to [place] and enjoy her walks there with family   Time 8   Period Weeks   Status New   PT LONG TERM GOAL #3   Title I with advanced HEP   Time 8   Period Weeks   Status New   PT LONG TERM GOAL #4   Title PAIN will decreased to 3/10 with all functional activities in order to return to work   PT [place] #5   Title  pt demo increased  strength to 4+/5 R knee without reports of pain during MMT   Time 8   Period Weeks   Status New   Additional Long Term Goals   Additional Long Term Goals Yes   PT LONG TERM GOAL #6   Title perform SLR with extension lag less than or equal to 10 degrees   Time 8   Period Weeks   Status New               Plan - 08/23/14 1330    Clinical Impression Statement Pt is a 44 year old heavy set female with knee pain after sustaining a fall in January 2016 during the snow. This fall resulted in hyperextension of her R knee. She was seen by both her doctor and an orthopedic surgeon, she had imaging completed and findings were unremarkable in relation to the injury. Pt. presents with increased knee pain, decreased strength and ROM in R LE secondary to pain,and minimal edema.  Pt is limited in her ability to exercise, walk long distances, getting in and out of her vehicle [facility]). Pt was introduced to a knee brace that would assist in alleviating her knee hyperextension and valgus. This will be discussed with doctor as well. Guadalupe would benefit from skilled PT services to decrease her pain, strengthen muscles in her legs, and work on body mechanics (form) during her exercises to prevent further pain.    Pt will benefit from skilled therapeutic intervention in order to improve on the following deficits Abnormal gait;Difficulty walking;Pain;Decreased activity tolerance;Hypomobility;Impaired flexibility;Improper body mechanics;Decreased mobility;Decreased strength;Increased edema;Postural dysfunction   Rehab Potential Fair   Clinical Impairments Affecting Rehab Potential weight and OA   PT Frequency 2x / week   PT Duration 8 weeks   PT Treatment/Interventions Iontophoresis 4mg /ml Dexamethasone;Patient/family education;Passive range of motion;Therapeutic activities;Moist Heat;Cryotherapy;Electrical Stimulation;Ultrasound;Neuromuscular re-education;Therapeutic exercise;Taping;Manual  techniques   PT Next Visit Plan quad exercises, ice, e stim, taping, HEP   PT Home Exercise Plan Quad sets only at this time instructed to stop exercises given by Dr. for now   [facility] and Agree with Plan of Care Patient       During this treatment session, the therapist was present, participating in and directing the treatment.  Problem List Patient Active Problem List   Diagnosis Date Noted  . Arthritis of right lower extremity 07/05/2014  . Right knee pain 04/17/2014  . Unspecified sleep apnea 06/02/2013  . Vertigo 06/02/2013  . Essential hypertension 06/14/2012  . Diabetes mellitus type 2, controlled, without complications 97/02/6376  . Obesity, morbid 06/14/2012    Radonna Ricker 08/23/2014, 2:12 PM  [facility] [address] [place], [place], [address] Phone: [phone]   Fax:  [phone]    Raeford Razor, PT 08/23/2014 3:23 PM Phone: [phone] Fax: [phone]

## 2014-08-23 NOTE — Patient Instructions (Signed)
Quad Set   With other leg bent, foot flat, slowly tighten muscles on thigh of straight leg while counting out loud to ____. Repeat with other leg. Repeat 10 times. Do 3 sessions per day.  Place towel roll under knee to pervent hyperextension [email or web address]   Use Ice 106min at a time, ice after exercises

## 2014-08-24 ENCOUNTER — Ambulatory Visit (INDEPENDENT_AMBULATORY_CARE_PROVIDER_SITE_OTHER): Payer: BLUE CROSS/BLUE SHIELD | Admitting: Family Medicine

## 2014-08-24 ENCOUNTER — Other Ambulatory Visit (INDEPENDENT_AMBULATORY_CARE_PROVIDER_SITE_OTHER): Payer: BLUE CROSS/BLUE SHIELD

## 2014-08-24 ENCOUNTER — Encounter: Payer: Self-pay | Admitting: Family Medicine

## 2014-08-24 VITALS — BP 126/84 | HR 88 | Ht 60.0 in | Wt 300.0 lb

## 2014-08-24 DIAGNOSIS — M129 Arthropathy, unspecified: Secondary | ICD-10-CM | POA: Diagnosis not present

## 2014-08-24 DIAGNOSIS — IMO0002 Reserved for concepts with insufficient information to code with codable children: Secondary | ICD-10-CM

## 2014-08-24 DIAGNOSIS — M25561 Pain in right knee: Secondary | ICD-10-CM | POA: Diagnosis not present

## 2014-08-24 NOTE — Progress Notes (Signed)
[facility] [place], [place] [address] Phone: [phone] Subjective:    I'm seeing this patient by the request  of:  Olga Millers, MD   CC: Right knee pain follow-up  [id number] Clancy Leiner is a 44 y.o. female coming in with complaint of right knee pain. Patient was found to have moderate arthritis in the knee. Patient was seen previously and was given topical anti-inflamed worse, icing protocol, and patient was given an injection. Patient was to do home exercises. Patient states she has made some mild improvement with range of motion as well as exercise endurance but continues to have the same pain. Patient is started with formal physical therapy one episode ended feeler was beneficial. Patient will continue for another 8 episodes. Patient denies any locking or clicking or giving out on her.      Past Medical History  Diagnosis Date  . Diabetes mellitus without complication   . Hypertension   . Migraines   . Depression   . Ulcer    Past Surgical History  Procedure Laterality Date  . Cesarean section  1993  . Tubal ligation  1993   History  Substance Use Topics  . Smoking status: Never Smoker   . Smokeless tobacco: Never Used  . Alcohol Use: No   Allergies  Allergen Reactions  . Codeine Itching  . Morphine And Related Itching  . Penicillins Swelling  . Sudafed [Pseudoephedrine Hcl] Swelling  . Azithromycin Rash  . Erythromycin Rash   Filed Weights   08/24/14 1423  Weight: 300 lb (136.079 kg)     Past medical history, social, surgical and family history all reviewed in electronic medical record.   Review of Systems: No headache, visual changes, nausea, vomiting, diarrhea, constipation, dizziness, abdominal pain, skin rash, fevers, chills, night sweats, weight loss, swollen lymph nodes, body aches, joint swelling, muscle aches, chest pain, shortness of breath, mood changes.   Objective Blood pressure 126/84,  pulse 88, height 5' (1.524 m), weight 300 lb (136.079 kg), SpO2 99 %.  General: No apparent distress alert and oriented x3 mood and affect normal, dressed appropriately.  HEENT: Pupils equal, extraocular movements intact  Respiratory: Patient's speak in full sentences and does not appear short of breath  Cardiovascular: No lower extremity edema, non tender, no erythema  Skin: Warm dry intact with no signs of infection or rash on extremities or on axial skeleton.  Abdomen: Soft nontender  Neuro: Cranial nerves II through XII are intact, neurovascularly intact in all extremities with 2+ DTRs and 2+ pulses.  Lymph: No lymphadenopathy of posterior or anterior cervical chain or axillae bilaterally.  Gait normal with good balance and coordination.  MSK:  Non tender with full range of motion and good stability and symmetric strength and tone of shoulders, elbows, wrist, hip, and ankles bilaterally.  Knee: Right Difficult to assess secondary to patient's body habitus Tender to palpation over the medial joint linestill present ROM full in flexion and extension and lower leg rotation. Ligaments with solid consistent endpoints including ACL, PCL, LCL, MCL. Negative Mcmurray's, Apley's, and Thessalonian tests. mild painful patellar compression. Patellar glide with mild crepitus. Patellar and quadriceps tendons unremarkable. Hamstring and quadriceps strength is normal.  No change from previous exam  Procedure: Real-time Ultrasound Guided Injection of right knee Device: GE Logiq E  Ultrasound guided injection is preferred based studies that show increased duration, increased effect, greater accuracy, decreased procedural pain, increased response rate, and decreased cost with  ultrasound guided versus blind injection.  Verbal informed consent obtained.  Time-out conducted.  Noted no overlying erythema, induration, or other signs of local infection.  Skin prepped in a sterile fashion.  Local anesthesia:  Topical Ethyl chloride.  With sterile technique and under real time ultrasound guidance: With a 22-gauge 2 inch needle patient was injected with15 mg/2.5 mL of Orthovisc (sodium hyaluronate) in a prefilled syringe was injected easily into the knee through a 22-gauge needle.. This was from a superior lateral approach.  Completed without difficulty  Pain immediately resolved suggesting accurate placement of the medication.  Advised to call if fevers/chills, erythema, induration, drainage, or persistent bleeding.  Images permanently stored and available for review in the ultrasound unit.  Impression: Technically successful ultrasound guided injection.           Impression and Recommendations:     This case required medical decision making of moderate complexity.

## 2014-08-24 NOTE — Patient Instructions (Addendum)
Good to see you Continue the exercises Continue good shoes PT will be great see you next week!

## 2014-08-24 NOTE — Progress Notes (Signed)
Pre visit review using our clinic review tool, if applicable. No additional management support is needed unless otherwise documented below in the visit note. 

## 2014-08-24 NOTE — Assessment & Plan Note (Signed)
Patient was given an injection today and decided to start the viscous supplementation. We discussed continuing the formal physical therapy, home exercises and icing protocol. Patient was given other choices including possible surgical intervention which patient declined. Patient come back in 1 week for second in a series of 4 injections.

## 2014-08-25 ENCOUNTER — Ambulatory Visit: Payer: BLUE CROSS/BLUE SHIELD | Admitting: Physical Therapy

## 2014-08-25 DIAGNOSIS — M25561 Pain in right knee: Secondary | ICD-10-CM

## 2014-08-25 DIAGNOSIS — S8981XS Other specified injuries of right lower leg, sequela: Secondary | ICD-10-CM | POA: Diagnosis not present

## 2014-08-25 DIAGNOSIS — M1711 Unilateral primary osteoarthritis, right knee: Secondary | ICD-10-CM

## 2014-08-25 NOTE — Therapy (Signed)
[facility], [place], [address] Phone: [phone]   Fax:  [phone]  Physical Therapy Treatment  Patient Details  Name: Lynn Fields MRN: [id number] Date of Birth: 1970/10/10 Referring Provider:  Olga Millers, MD  Encounter Date: 08/25/2014      PT End of Session - 08/25/14 0937    Visit Number 2   Number of Visits 16   Date for PT Re-Evaluation 10/18/14   PT Start Time 0935   PT Stop Time 8250   PT Time Calculation (min) 48 min      Past Medical History  Diagnosis Date  . Diabetes mellitus without complication   . Hypertension   . Migraines   . Depression   . Ulcer     Past Surgical History  Procedure Laterality Date  . Cesarean section  1993  . Tubal ligation  1993    There were no vitals filed for this visit.  Visit Diagnosis:  Osteoarthritis of right knee, unspecified osteoarthritis type  Knee joint pain, right  Knee hyperextension injury, right, sequela      Subjective Assessment - 08/25/14 0936    Subjective I had an injection yesterday and my knee is a little sore. I go back for three more injections. It feels better than it did last time I was here.    Currently in Pain? Yes   Pain Score 4    Pain Location Knee   Pain Orientation Right                         [facility] Adult PT Treatment/Exercise - 08/25/14 0949    Knee/Hip Exercises: Aerobic   Stationary Bike Nustep L 3 x 5 min while discussing water aerobics, bicycling for exercise.   Knee/Hip Exercises: Seated   Long Arc Quad Right;20 reps;Weights   Long Arc Quad Weight 5 lbs.   Long [employer] Limitations red band for home   Hamstring Curl Right;2 sets;10 reps   Hamstring Limitations red band   Knee/Hip Exercises: Supine   Quad Sets Strengthening;Right;1 set;10 reps;Limitations   Quad Sets Limitations towel roll   Short Arc [employer] Right;15 reps   Short Arc [employer] Limitations slow and  controlled   Bridges Limitations Bridge x 10   Straight Leg Raises 10 reps   Straight Leg Raises Limitations fatigue   Knee/Hip Exercises: Sidelying   Hip ABduction Right;10 reps   Clams x 15 reps right                PT Education - 08/25/14 1017    Education provided Yes   Education Details Hep: SAQ, LAQ, hamstring curl seated, Bridge, SLR and Hip abduction    Person(s) Educated Patient   Methods Explanation;Handout   Comprehension Verbalized understanding          PT Short Term Goals - 08/25/14 1005    PT SHORT TERM GOAL #1   Title I with initial HEP   Time 1   Period Weeks   Status Achieved   PT SHORT TERM GOAL #2   Title report a decrease in pain at rest from 6/10 to 3/10   Time 4   Period Weeks   Status On-going   PT SHORT TERM GOAL #3   Title Demonstrate and verbalize understanding of condition management including RICE, positioning to prevent hyperextension at rest, and HEP   Time 4   Period Weeks   Status On-going  PT SHORT TERM GOAL #4   Title Pt reports ability to participate in her standing ab workout video for 4min without increased pain   Time 4   Period Weeks   Status On-going   PT SHORT TERM GOAL #5   Title pt demonstrates and verbalizes approprite gait sequence up and down curbs/stairs to alleviate increases in knee pain   Time 4   Period Weeks   Status On-going           PT Long Term Goals - 08/23/14 1349    PT LONG TERM GOAL #1   Title Pt is able to tolerate elliptical/TM 4minutes without increased knee pain in order to workout again at home   Time 8   Period Weeks   Status New   PT LONG TERM GOAL #2   Title Pt reports ability to walk 2 hours in order to go to [place] and enjoy her walks there with family   Time 8   Period Weeks   Status New   PT LONG TERM GOAL #3   Title I with advanced HEP   Time 8   Period Weeks   Status New   PT LONG TERM GOAL #4   Title PAIN will decreased to 3/10 with all functional activities  in order to return to work   PT Walker #5   Title pt demo increased strength to 4+/5 R knee without reports of pain during MMT   Time 8   Period Weeks   Status New   Additional Long Term Goals   Additional Long Term Goals Yes   PT LONG TERM GOAL #6   Title perform SLR with extension lag less than or equal to 10 degrees   Time 8   Period Weeks   Status New               Plan - 08/25/14 1024    Clinical Impression Statement Pt instucted in Nustep and Mat exercises for right knee strength and bilateral hip strength without increased pain. Reviewed info given last treatment about avoiding knee hyperextension. Updated HEP.   PT Next Visit Plan quad exercises, ice, e stim, taping, HEP        Problem List Patient Active Problem List   Diagnosis Date Noted  . Arthritis of right lower extremity 07/05/2014  . Right knee pain 04/17/2014  . Unspecified sleep apnea 06/02/2013  . Vertigo 06/02/2013  . Essential hypertension 06/14/2012  . Diabetes mellitus type 2, controlled, without complications 02/54/2706  . Obesity, morbid 06/14/2012    Dorene Ar, PTA 08/25/2014, 10:32 AM  [facility] [address] [place], [place], [address] Phone: [phone]   Fax:  [phone]

## 2014-08-25 NOTE — Patient Instructions (Signed)
Knee Extension: Resisted (Sitting)   With band looped around right ankle and under other foot, straighten leg with ankle loop. Keep other leg bent to increase resistance. Repeat _10___ times per set. Do __2__ sets per session. Do __2__ sessions per day.  [email or web address]    Knee Extension: Resisted (Sitting)   With band looped around right ankle and under other foot, straighten leg with ankle loop. Keep other leg bent to increase resistance. Repeat __10-15__ times per set. Do __2__ sets per session. Do __2__ sessions per day.  [email or web address]   CKnee Flexion: Resisted (Sitting)   Sit with band under left foot and looped around ankle of supported leg. Pull unsupported leg back. Repeat __10-15__ times per set. Do __2__ sets per session. Do _2___ sessions per day.  [email or web address]   Copyright  VHI. All rights reserved.        Straight Leg Raise   Tighten stomach and slowly raise locked right leg 12____ inches from floor. Repeat 10____ times per set. Do _2___ sets per session. Do _2___ sessions per day. Both legs  [email or web address]   Copyright  VHI. All rights reserved.     Bridge   Lie back, legs bent. Inhale, pressing hips up. Keeping ribs in, lengthen lower back. Exhale, rolling down along spine from top. Repeat __10__ times. Do _2___ sessions per day.  Copyright  VHI. All rights reserved.   Abduction: Side Leg Lift (Eccentric) - Side-Lying      Lie on side. Lift top leg slightly higher than shoulder level. Keep top leg straight with body, toes pointing forward. Slowly lower for 3-5 seconds. __10_ reps per set, _2__ sets per day, _7__ days per week.Add _1-2__ lbs when you achieve _20__ repetitions easily  Copyright  VHI. All rights reserved.

## 2014-08-30 ENCOUNTER — Encounter: Payer: BLUE CROSS/BLUE SHIELD | Admitting: Physical Therapy

## 2014-08-30 ENCOUNTER — Ambulatory Visit: Payer: BLUE CROSS/BLUE SHIELD | Attending: Family Medicine | Admitting: Physical Therapy

## 2014-08-30 DIAGNOSIS — M25561 Pain in right knee: Secondary | ICD-10-CM | POA: Diagnosis present

## 2014-08-30 DIAGNOSIS — S8981XS Other specified injuries of right lower leg, sequela: Secondary | ICD-10-CM | POA: Diagnosis present

## 2014-08-30 DIAGNOSIS — M179 Osteoarthritis of knee, unspecified: Secondary | ICD-10-CM | POA: Insufficient documentation

## 2014-08-30 NOTE — Therapy (Signed)
[place], [place], [address] Phone: [phone]   Fax:  [phone]  Physical Therapy Treatment  Patient Details  Name: Lynn Fields MRN: [id number] Date of Birth: 1970-11-21 Referring Provider:  Olga Millers, MD  Encounter Date: 08/30/2014      PT End of Session - 08/30/14 1305    Visit Number 3   Number of Visits 16   Date for PT Re-Evaluation 10/18/14   PT Start Time 0742   PT Stop Time 0825   PT Time Calculation (min) 43 min   Activity Tolerance Patient tolerated treatment well;No increased pain   Behavior During Therapy [facility] for tasks assessed/performed      Past Medical History  Diagnosis Date  . Diabetes mellitus without complication   . Hypertension   . Migraines   . Depression   . Ulcer     Past Surgical History  Procedure Laterality Date  . Cesarean section  1993  . Tubal ligation  1993    There were no vitals filed for this visit.  Visit Diagnosis:  Knee joint pain, right      Subjective Assessment - 08/30/14 0743    Subjective 6-7/10 pain.  Has been doing her home exercises.   Currently in Pain? Yes   Pain Score 7    Pain Location Knee   Pain Orientation Right;Posterior   Pain Descriptors / Indicators Throbbing;Sharp   Pain Frequency Constant   Aggravating Factors  constant moving   Pain Relieving Factors ice, resting   Multiple Pain Sites No                         [facility] Adult PT Treatment/Exercise - 08/30/14 0745    Self-Care   Self-Care --  Husband ed on retrograde massage for edema leg,/practiced   Cryotherapy   Number Minutes Cryotherapy 15 Minutes   Cryotherapy Location Knee   Type of Cryotherapy --  cold pack   Electrical Stimulation   Electrical Stimulation Location knee  posterior   Electrical Stimulation Action IFC   Electrical Stimulation Parameters 5   Electrical Stimulation Goals Pain   Manual Therapy   Manual therapy  comments retrograde soft tissue wokk with lymph system vacume activation with deep breathing in belly and trunk rotations for abdominal activation.  2 fans of kinesiotex taping for edema knee.                PT Education - 08/30/14 1308    Education provided Yes   Education Details self care edema manual, elevation cold with hands on practice by husband   Person(s) Educated Patient   Methods Explanation;Demonstration;Tactile cues;Verbal cues   Comprehension Verbalized understanding;Returned demonstration          PT Short Term Goals - 08/25/14 1005    PT SHORT TERM GOAL #1   Title I with initial HEP   Time 1   Period Weeks   Status Achieved   PT SHORT TERM GOAL #2   Title report a decrease in pain at rest from 6/10 to 3/10   Time 4   Period Weeks   Status On-going   PT SHORT TERM GOAL #3   Title Demonstrate and verbalize understanding of condition management including RICE, positioning to prevent hyperextension at rest, and HEP   Time 4   Period Weeks   Status On-going   PT SHORT TERM GOAL #4   Title Pt reports ability to participate  in her standing ab workout video for 20min without increased pain   Time 4   Period Weeks   Status On-going   PT SHORT TERM GOAL #5   Title pt demonstrates and verbalizes approprite gait sequence up and down curbs/stairs to alleviate increases in knee pain   Time 4   Period Weeks   Status On-going           PT Long Term Goals - 08/23/14 1349    PT LONG TERM GOAL #1   Title Pt is able to tolerate elliptical/TM 42minutes without increased knee pain in order to workout again at home   Time 8   Period Weeks   Status New   PT LONG TERM GOAL #2   Title Pt reports ability to walk 2 hours in order to go to [place] and enjoy her walks there with family   Time 8   Period Weeks   Status New   PT LONG TERM GOAL #3   Title I with advanced HEP   Time 8   Period Weeks   Status New   PT LONG TERM GOAL #4   Title PAIN will  decreased to 3/10 with all functional activities in order to return to work   PT [place] #5   Title pt demo increased strength to 4+/5 R knee without reports of pain during MMT   Time 8   Period Weeks   Status New   Additional Long Term Goals   Additional Long Term Goals Yes   PT LONG TERM GOAL #6   Title perform SLR with extension lag less than or equal to 10 degrees   Time 8   Period Weeks   Status New               Plan - 08/30/14 1305    Clinical Impression Statement pain still constant.  Shorter session today pationt late.  Focus on pain control and education to reduce pain and swelling.        Problem List Patient Active Problem List   Diagnosis Date Noted  . Arthritis of right lower extremity 07/05/2014  . Right knee pain 04/17/2014  . Unspecified sleep apnea 06/02/2013  . Vertigo 06/02/2013  . Essential hypertension 06/14/2012  . Diabetes mellitus type 2, controlled, without complications 82/95/6213  . Obesity, morbid 06/14/2012    [place] Fields 08/30/2014, 1:11 PM  [facility] [address] [place], [place], [address] Phone: [phone]   Fax:  [phone]     Melvenia Needles, PTA 08/30/2014 1:11 PM Phone: [phone] Fax: [phone]

## 2014-08-31 ENCOUNTER — Ambulatory Visit (INDEPENDENT_AMBULATORY_CARE_PROVIDER_SITE_OTHER): Payer: BLUE CROSS/BLUE SHIELD | Admitting: Family Medicine

## 2014-08-31 ENCOUNTER — Encounter: Payer: Self-pay | Admitting: Family Medicine

## 2014-08-31 ENCOUNTER — Other Ambulatory Visit (INDEPENDENT_AMBULATORY_CARE_PROVIDER_SITE_OTHER): Payer: BLUE CROSS/BLUE SHIELD

## 2014-08-31 VITALS — BP 124/84 | HR 78 | Ht 60.0 in | Wt 300.0 lb

## 2014-08-31 DIAGNOSIS — M25561 Pain in right knee: Secondary | ICD-10-CM

## 2014-08-31 DIAGNOSIS — IMO0002 Reserved for concepts with insufficient information to code with codable children: Secondary | ICD-10-CM

## 2014-08-31 DIAGNOSIS — M129 Arthropathy, unspecified: Secondary | ICD-10-CM

## 2014-08-31 NOTE — Progress Notes (Signed)
Pre visit review using our clinic review tool, if applicable. No additional management support is needed unless otherwise documented below in the visit note. 

## 2014-08-31 NOTE — Patient Instructions (Signed)
Good to see you  Ice is your friend THIS TIME you should notice an improvement.  We will get the guys for the brace  See me again next week for #3

## 2014-08-31 NOTE — Assessment & Plan Note (Signed)
Patient given second in a series of 4 injections. Encourage patient to continue home exercises in the physical therapy. Patient will get a custom unloader brace. Patient will come back in 1 week for third and series of 4 injections.

## 2014-08-31 NOTE — Progress Notes (Signed)
[name] [place], [place] [address] Phone: [phone] Subjective:    I'm seeing this patient by the request  of:  Olga Millers, MD   CC: Right knee pain follow-up  EQA:[id number] [place] is a 44 y.o. female coming in with complaint of right knee pain. Patient was found to have moderate arthritis in the knee. Patient was seen previously and was given topical anti-inflamed worse, icing protocol, and patient was given an injection. Patient was to do home exercises. Patient continued have pain and we did start Orthovisc. Patient is here for second injection in a series of 4. Patient states physical therapy is helping. Patient continues to make very mild strides. Patient is wondering if a brace will be beneficial.      Past Medical History  Diagnosis Date  . Diabetes mellitus without complication   . Hypertension   . Migraines   . Depression   . Ulcer    Past Surgical History  Procedure Laterality Date  . Cesarean section  1993  . Tubal ligation  1993   History  Substance Use Topics  . Smoking status: Never Smoker   . Smokeless tobacco: Never Used  . Alcohol Use: No   Allergies  Allergen Reactions  . Codeine Itching  . Morphine And Related Itching  . Penicillins Swelling  . Sudafed [Pseudoephedrine Hcl] Swelling  . Azithromycin Rash  . Erythromycin Rash   Filed Weights   08/31/14 1554  Weight: 300 lb (136.079 kg)     Past medical history, social, surgical and family history all reviewed in electronic medical record.   Review of Systems: No headache, visual changes, nausea, vomiting, diarrhea, constipation, dizziness, abdominal pain, skin rash, fevers, chills, night sweats, weight loss, swollen lymph nodes, body aches, joint swelling, muscle aches, chest pain, shortness of breath, mood changes.   Objective Blood pressure 124/84, pulse 78, height 5' (1.524 m), weight 300 lb (136.079 kg), SpO2 97 %.  General: No  apparent distress alert and oriented x3 mood and affect normal, dressed appropriately.  HEENT: Pupils equal, extraocular movements intact  Respiratory: Patient's speak in full sentences and does not appear short of breath  Cardiovascular: No lower extremity edema, non tender, no erythema  Skin: Warm dry intact with no signs of infection or rash on extremities or on axial skeleton.  Abdomen: Soft nontender  Neuro: Cranial nerves II through XII are intact, neurovascularly intact in all extremities with 2+ DTRs and 2+ pulses.  Lymph: No lymphadenopathy of posterior or anterior cervical chain or axillae bilaterally.  Gait normal with good balance and coordination.  MSK:  Non tender with full range of motion and good stability and symmetric strength and tone of shoulders, elbows, wrist, hip, and ankles bilaterally.  Knee: Right Difficult to assess secondary to patient's body habitus Tender to palpation over the medial joint linestill present ROM full in flexion and extension and lower leg rotation. Ligaments with solid consistent endpoints including ACL, PCL, LCL, MCL. Negative Mcmurray's, Apley's, and Thessalonian tests. mild painful patellar compression. Patellar glide with mild crepitus. Patellar and quadriceps tendons unremarkable. Hamstring and quadriceps strength is normal.  No change from previous exam  Procedure: Real-time Ultrasound Guided Injection of right knee Device: GE Logiq E  Ultrasound guided injection is preferred based studies that show increased duration, increased effect, greater accuracy, decreased procedural pain, increased response rate, and decreased cost with ultrasound guided versus blind injection.  Verbal informed consent obtained.  Time-out conducted.  Noted no overlying erythema, induration, or other signs of local infection.  Skin prepped in a sterile fashion.  Local anesthesia: Topical Ethyl chloride.  With sterile technique and under real time ultrasound  guidance: With a 22-gauge 2 inch needle patient was injected with15 mg/2.5 mL of Orthovisc (sodium hyaluronate) in a prefilled syringe was injected easily into the knee through a 22-gauge needle.. This was from a superior lateral approach.  Completed without difficulty  Pain immediately resolved suggesting accurate placement of the medication.  Advised to call if fevers/chills, erythema, induration, drainage, or persistent bleeding.  Images permanently stored and available for review in the ultrasound unit.  Impression: Technically successful ultrasound guided injection.           Impression and Recommendations:     This case required medical decision making of moderate complexity.

## 2014-09-05 ENCOUNTER — Ambulatory Visit: Payer: BLUE CROSS/BLUE SHIELD | Admitting: Physical Therapy

## 2014-09-05 DIAGNOSIS — M25561 Pain in right knee: Secondary | ICD-10-CM | POA: Diagnosis not present

## 2014-09-05 DIAGNOSIS — S8981XS Other specified injuries of right lower leg, sequela: Secondary | ICD-10-CM

## 2014-09-05 DIAGNOSIS — M1711 Unilateral primary osteoarthritis, right knee: Secondary | ICD-10-CM

## 2014-09-05 NOTE — Patient Instructions (Addendum)
HIP: Flexion / KNEE: Extension, Straight Leg Raise Four  Raise leg, keeping knee straight. Perform slowly.10-30 ___ reps per set, _1__ sets per day3-4, ___ days per week  4 way added to home exercise programCopyright  VHI. All rights reserved.

## 2014-09-05 NOTE — Therapy (Signed)
[place], [place], [address] Phone: [phone]   Fax:  [phone]  Physical Therapy Treatment  Patient Details  Name: [place] Fields MRN: [id number] Date of Birth: Mar 08, 1970 Referring Provider:  Olga Millers, MD  Encounter Date: 09/05/2014      PT End of Session - 09/05/14 0800    Visit Number 4   Number of Visits 16   Date for PT Re-Evaluation 10/18/14   PT Start Time 0735   PT Stop Time 0801   PT Time Calculation (min) 26 min   Activity Tolerance Patient tolerated treatment well   Behavior During Therapy [facility] for tasks assessed/performed      Past Medical History  Diagnosis Date  . Diabetes mellitus without complication   . Hypertension   . Migraines   . Depression   . Ulcer     Past Surgical History  Procedure Laterality Date  . Cesarean section  1993  . Tubal ligation  1993    There were no vitals filed for this visit.  Visit Diagnosis:  Knee joint pain, right  Osteoarthritis of right knee, unspecified osteoarthritis type  Knee hyperextension injury, right, sequela      Subjective Assessment - 09/05/14 2800    Subjective Saw MD.  Going to get brace and get fitted at MD office.  Had a "gel" injection in knee and it bruised.  Pain 2/10   Currently in Pain? Yes   Pain Score 2    Pain Location Knee   Pain Orientation Right   Pain Descriptors / Indicators Aching   Pain Frequency Constant   Aggravating Factors  getting in /out car , walking a whole lot   Pain Relieving Factors ice resting   Multiple Pain Sites No                         [facility] Adult PT Treatment/Exercise - 09/05/14 0740    Knee/Hip Exercises: Stretches   Gastroc Stretch 3 reps;30 seconds   Knee/Hip Exercises: Aerobic   Stationary Bike Nustep , L4 5 minutes   Knee/Hip Exercises: Standing   Lateral Step Up Right;1 set;15 reps;Hand Hold: 1   Forward Step Up Right;1 set;10 reps;Hand Hold:  2   Knee/Hip Exercises: Supine   Short Arc Quad Sets Right;3 sets;10 reps   Short Arc Quad Sets Limitations 2 LBS   Straight Leg Raises 10 reps   Straight Leg Raises Limitations slight quad lag   Knee/Hip Exercises: Sidelying   Other Sidelying Knee/Hip Exercises Hip 4 way 10 reps with cues                PT Education - 09/05/14 0800    Education provided Yes   Education Details Hip 4 way   Person(s) Educated Patient;Spouse   Methods Explanation;Tactile cues;Verbal cues;Handout   Comprehension Verbalized understanding;Returned demonstration          PT Short Term Goals - 08/25/14 1005    PT SHORT TERM GOAL #1   Title I with initial HEP   Time 1   Period Weeks   Status Achieved   PT SHORT TERM GOAL #2   Title report a decrease in pain at rest from 6/10 to 3/10   Time 4   Period Weeks   Status On-going   PT SHORT TERM GOAL #3   Title Demonstrate and verbalize understanding of condition management including RICE, positioning to prevent hyperextension at rest, and HEP  Time 4   Period Weeks   Status On-going   PT SHORT TERM GOAL #4   Title Pt reports ability to participate in her standing ab workout video for 29mn without increased pain   Time 4   Period Weeks   Status On-going   PT SHORT TERM GOAL #5   Title pt demonstrates and verbalizes approprite gait sequence up and down curbs/stairs to alleviate increases in knee pain   Time 4   Period Weeks   Status On-going           PT Long Term Goals - 08/23/14 1349    PT LONG TERM GOAL #1   Title Pt is able to tolerate elliptical/TM 193mutes without increased knee pain in order to workout again at home   Time 8   Period Weeks   Status New   PT LONG TERM GOAL #2   Title Pt reports ability to walk 2 hours [place]   Time 8   Period Weeks   Status New   PT LONG TERM GOAL #3   Title I with advanced HEP   Time 8   Period Weeks   Status New   PT  LONG TERM GOAL #4   Title PAIN will decreased to 3/10 with all functional activities in order to return to work   PT [place]OW[place]ace5   Title pt demo increased strength to 4+/5 R knee without reports of pain during MMT   Time 8   Period Weeks   Status New   Additional Long Term Goals   Additional Long Term Goals Yes   PT LONG TERM GOAL #6   Title perform SLR with extension lag less than or equal to 10 degrees   Time 8   Period Weeks   Status New               Plan - 09/05/14 0801    Clinical Impression Statement Pain goal at rest met. Progress toward Exercise goal.  Less quad lag.   PT Next Visit Plan closed chain. rewiew SLR'S   Consulted and Agree with Plan of Care Patient;Family member/caregiver        Problem List Patient Active Problem List   Diagnosis Date Noted  . Arthritis of right lower extremity 07/05/2014  . Right knee pain 04/17/2014  . Unspecified sleep apnea 06/02/2013  . Vertigo 06/02/2013  . Essential hypertension 06/14/2012  . Diabetes mellitus type 2, controlled, without complications 0532/67/1245. Obesity, morbid 06/14/2012    Jontay Maston 09/05/2014, 1:23 PM  [facility]: [phone] Fax:  [phone]   KaMelvenia NeedlesPTA 09/05/2014 1:23 PM Phone: [phone]: [phone]

## 2014-09-07 ENCOUNTER — Other Ambulatory Visit (INDEPENDENT_AMBULATORY_CARE_PROVIDER_SITE_OTHER): Payer: BLUE CROSS/BLUE SHIELD

## 2014-09-07 ENCOUNTER — Ambulatory Visit (INDEPENDENT_AMBULATORY_CARE_PROVIDER_SITE_OTHER): Payer: BLUE CROSS/BLUE SHIELD | Admitting: Family Medicine

## 2014-09-07 ENCOUNTER — Encounter: Payer: Self-pay | Admitting: Family Medicine

## 2014-09-07 ENCOUNTER — Ambulatory Visit: Payer: BLUE CROSS/BLUE SHIELD | Admitting: Physical Therapy

## 2014-09-07 VITALS — BP 120/82 | HR 77 | Ht 60.0 in | Wt 300.0 lb

## 2014-09-07 DIAGNOSIS — IMO0002 Reserved for concepts with insufficient information to code with codable children: Secondary | ICD-10-CM

## 2014-09-07 DIAGNOSIS — M25561 Pain in right knee: Secondary | ICD-10-CM

## 2014-09-07 DIAGNOSIS — M129 Arthropathy, unspecified: Secondary | ICD-10-CM

## 2014-09-07 DIAGNOSIS — M1711 Unilateral primary osteoarthritis, right knee: Secondary | ICD-10-CM

## 2014-09-07 DIAGNOSIS — S8981XS Other specified injuries of right lower leg, sequela: Secondary | ICD-10-CM

## 2014-09-07 NOTE — Therapy (Signed)
[place], [place], [address] Phone: [phone]   Fax:  [phone]  Physical Therapy Treatment  Patient Details  Name: [place] Fields MRN: [id number] Date of Birth: 1970/03/30 Referring Provider:  Olga Millers, MD  Encounter Date: 09/07/2014      PT End of Session - 09/07/14 0757    Visit Number 5   Number of Visits 16   Date for PT Re-Evaluation 10/18/14   PT Start Time 0731   PT Stop Time 0800   PT Time Calculation (min) 29 min   Activity Tolerance Patient tolerated treatment well;No increased pain   Behavior During Therapy [facility] for tasks assessed/performed      Past Medical History  Diagnosis Date  . Diabetes mellitus without complication   . Hypertension   . Migraines   . Depression   . Ulcer     Past Surgical History  Procedure Laterality Date  . Cesarean section  1993  . Tubal ligation  1993    There were no vitals filed for this visit.  Visit Diagnosis:  Knee joint pain, right  Osteoarthritis of right knee, unspecified osteoarthritis type  Knee hyperextension injury, right, sequela      Subjective Assessment - 09/07/14 0735    Subjective Got measured for brace and it should be here this wekend.  Pain 2/10                         [facility] Adult PT Treatment/Exercise - 09/07/14 0736    Knee/Hip Exercises: Aerobic   Stationary Bike --   Knee/Hip Exercises: Standing   Lateral Step Up Both;1 set;10 reps;Hand Hold: 1;Step Height: 4"   Forward Step Up Both;1 set;Hand Hold: 0;Step Height: 4"   Other Standing Knee Exercises sit to stand 10 reps   Knee/Hip Exercises: Supine   Bridges Limitations X10   Straight Leg Raises 10 reps;Both   Straight Leg Raises Limitations 0 quad  lag   Knee/Hip Exercises: Sidelying   Hip ABduction Limitations 10  each   Hip ADduction 10 reps   Hip ADduction Limitations difficult   Clams 10   each                   PT Short Term Goals - 09/07/14 0737    PT SHORT TERM GOAL #1   Title I with initial HEP   Time 1   Period Weeks   Status Achieved   PT SHORT TERM GOAL #2   Title report a decrease in pain at rest from 6/10 to 3/10   Baseline 6/10 highest pain yesterday   Time 4   Period Weeks   Status On-going   PT SHORT TERM GOAL #3   Title Demonstrate and verbalize understanding of condition management including RICE, positioning to prevent hyperextension at rest, and HEP   Baseline Understands and adhers with these   Time 4   Period Weeks   Status Achieved   PT SHORT TERM GOAL #4   Title Pt reports ability to participate in her standing ab workout video for 41mn without increased pain   Baseline has not tried due to pain   Time 4   Period Weeks   Status On-going   PT SHORT TERM GOAL #5   Title pt demonstrates and verbalizes approprite gait sequence up and down curbs/stairs to alleviate increases in knee pain   Baseline Avoids steps for now due to pain  Time 4   Period Weeks   Status On-going   PT SHORT TERM GOAL #6   Title Patient will demo increased strength in Hip/knee motions to 4+/5   Time 4   Period Weeks   Status On-going           PT Long Term Goals - 08/23/14 1349    PT LONG TERM GOAL #1   Title Pt is able to tolerate elliptical/TM 26mnutes without increased knee pain in order to workout again at home   Time 8   Period Weeks   Status New   PT LONG TERM GOAL #2   Title Pt reports ability to walk 2 hours [place]   Time 8   Period Weeks   Status New   PT LONG TERM GOAL #3   Title I with advanced HEP   Time 8   Period Weeks   Status New   PT LONG TERM GOAL #4   Title PAIN will decreased to 3/10 with all functional activities in order to return to work   PT [place]#5   Title pt demo increased strength to 4+/5 R knee without reports of pain during MMT   Time 8   Period Weeks   Status New    Additional Long Term Goals   Additional Long Term Goals Yes   PT LONG TERM GOAL #6   Title perform SLR with extension lag less than or equal to 10 degrees   Time 8   Period Weeks   Status New               Plan - 09/07/14 0758    Clinical Impression Statement RICE goal met.  Strengthening hips knee focus.   PT Next Visit Plan try wall sits. try steps   Consulted and Agree with Plan of Care Patient        Problem List Patient Active Problem List   Diagnosis Date Noted  . Arthritis of right lower extremity 07/05/2014  . Right knee pain 04/17/2014  . Unspecified sleep apnea 06/02/2013  . Vertigo 06/02/2013  . Essential hypertension 06/14/2012  . Diabetes mellitus type 2, controlled, without complications 068/61/6837 . Obesity, morbid 06/14/2012    [facility]8/11/2014, 8:00 AM  [facility] [address]Phone: [phone]  Fax:  [phone]    KMelvenia Needles PTA 09/07/2014 8:00 AM Phone: [phone]: [phone]

## 2014-09-07 NOTE — Progress Notes (Signed)
Pre visit review using our clinic review tool, if applicable. No additional management support is needed unless otherwise documented below in the visit note. 

## 2014-09-07 NOTE — Patient Instructions (Signed)
Good to see you The brace is going to be great  Ice is your friend One more week to go See you then!

## 2014-09-07 NOTE — Progress Notes (Signed)
[name] [place], [place] [address] Phone: [phone] Subjective:    I'm seeing this patient by the request  of:  Olga Millers, MD   CC: Right knee pain follow-up  [id number] Lynn Fields is a 44 y.o. female coming in with complaint of right knee pain. Patient was found to have moderate arthritis in the knee. Patient was seen previously and was given topical anti-inflamed worse, icing protocol, and patient was given an injection. Patient was to do home exercises. Patient continued have pain and we did start Orthovisc. Patient is here for 3rd injection in a series of 4. Patient states physical therapy is helping. Patient continues to make very mild strides. Patient is gaining in custom brace next week as well.      Past Medical History  Diagnosis Date  . Diabetes mellitus without complication   . Hypertension   . Migraines   . Depression   . Ulcer    Past Surgical History  Procedure Laterality Date  . Cesarean section  1993  . Tubal ligation  1993   Social History  Substance Use Topics  . Smoking status: Never Smoker   . Smokeless tobacco: Never Used  . Alcohol Use: No   Allergies  Allergen Reactions  . Codeine Itching  . Morphine And Related Itching  . Penicillins Swelling  . Sudafed [Pseudoephedrine Hcl] Swelling  . Azithromycin Rash  . Erythromycin Rash   Filed Weights   09/07/14 1521  Weight: 300 lb (136.079 kg)     Past medical history, social, surgical and family history all reviewed in electronic medical record.   Review of Systems: No headache, visual changes, nausea, vomiting, diarrhea, constipation, dizziness, abdominal pain, skin rash, fevers, chills, night sweats, weight loss, swollen lymph nodes, body aches, joint swelling, muscle aches, chest pain, shortness of breath, mood changes.   Objective Blood pressure 120/82, pulse 77, height 5' (1.524 m), weight 300 lb (136.079 kg), SpO2 99 %.    General: No apparent distress alert and oriented x3 mood and affect normal, dressed appropriately.  HEENT: Pupils equal, extraocular movements intact  Respiratory: Patient's speak in full sentences and does not appear short of breath  Cardiovascular: No lower extremity edema, non tender, no erythema  Skin: Warm dry intact with no signs of infection or rash on extremities or on axial skeleton.  Abdomen: Soft nontender  Neuro: Cranial nerves II through XII are intact, neurovascularly intact in all extremities with 2+ DTRs and 2+ pulses.  Lymph: No lymphadenopathy of posterior or anterior cervical chain or axillae bilaterally.  Gait normal with good balance and coordination.  MSK:  Non tender with full range of motion and good stability and symmetric strength and tone of shoulders, elbows, wrist, hip, and ankles bilaterally.  Knee: Right Difficult to assess secondary to patient's body habitus Tender to palpation over the medial joint linestill present ROM full in flexion and extension and lower leg rotation. Ligaments with solid consistent endpoints including ACL, PCL, LCL, MCL. Negative Mcmurray's, Apley's, and Thessalonian tests. mild painful patellar compression. Patellar glide with mild crepitus. Patellar and quadriceps tendons unremarkable. Hamstring and quadriceps strength is normal.  No change from previous exam  Procedure: Real-time Ultrasound Guided Injection of right knee Device: GE Logiq E  Ultrasound guided injection is preferred based studies that show increased duration, increased effect, greater accuracy, decreased procedural pain, increased response rate, and decreased cost with ultrasound guided versus blind injection.  Verbal informed consent obtained.  Time-out conducted.  Noted no overlying erythema, induration, or other signs of local infection.  Skin prepped in a sterile fashion.  Local anesthesia: Topical Ethyl chloride.  With sterile technique and under real time  ultrasound guidance: With a 22-gauge 2 inch needle patient was injected with15 mg/2.5 mL of Orthovisc (sodium hyaluronate) in a prefilled syringe was injected easily into the knee through a 22-gauge needle.. This was from a superior lateral approach.  Completed without difficulty  Pain immediately resolved suggesting accurate placement of the medication.  Advised to call if fevers/chills, erythema, induration, drainage, or persistent bleeding.  Images permanently stored and available for review in the ultrasound unit.  Impression: Technically successful ultrasound guided injection.           Impression and Recommendations:     This case required medical decision making of moderate complexity.

## 2014-09-07 NOTE — Assessment & Plan Note (Signed)
Third injection in a series of 4. Patient will continue with physical therapy, topical anti-implant was, icing home exercises as well as the custom brace and patient will come back in 1 week for fourth and final injection.

## 2014-09-12 ENCOUNTER — Ambulatory Visit: Payer: BLUE CROSS/BLUE SHIELD | Admitting: Physical Therapy

## 2014-09-12 DIAGNOSIS — M1711 Unilateral primary osteoarthritis, right knee: Secondary | ICD-10-CM

## 2014-09-12 DIAGNOSIS — M25561 Pain in right knee: Secondary | ICD-10-CM

## 2014-09-12 DIAGNOSIS — S8981XS Other specified injuries of right lower leg, sequela: Secondary | ICD-10-CM

## 2014-09-12 NOTE — Therapy (Signed)
[place], [place], [address] Phone: [phone]   Fax:  [phone]  Physical Therapy Treatment  Patient Details  Name: Lynn Fields MRN: [id number] Date of Birth: Jul 09, 1970 Referring Provider:  Olga Millers, MD  Encounter Date: 09/12/2014      PT End of Session - 09/12/14 0810    Visit Number 6   Number of Visits 16   Date for PT Re-Evaluation 10/18/14   PT Start Time 0805   PT Stop Time 0998   PT Time Calculation (min) 50 min   Activity Tolerance Patient tolerated treatment well   Behavior During Therapy [facility] for tasks assessed/performed      Past Medical History  Diagnosis Date  . Diabetes mellitus without complication   . Hypertension   . Migraines   . Depression   . Ulcer     Past Surgical History  Procedure Laterality Date  . Cesarean section  1993  . Tubal ligation  1993    There were no vitals filed for this visit.  Visit Diagnosis:  Knee joint pain, right  Osteoarthritis of right knee, unspecified osteoarthritis type  Knee hyperextension injury, right, sequela      Subjective Assessment - 09/12/14 0806    Subjective Pt is having some pain today 4/10. Sleeps with knee propped up but by morning she is  no longer propped due to moving around at night. She received her knee brace and will be going to doctor office to having it set up for her today at 2:00pm.   Patient is accompained by: Family member   Pain Score 4    Pain Location Knee   Pain Orientation Right   Pain Descriptors / Indicators Aching   Pain Frequency Intermittent            [facility] Adult PT Treatment/Exercise - 09/12/14 0821    Knee/Hip Exercises: Stretches   Active Hamstring Stretch Right;3 reps;30 seconds;Other (comment)  with strap   Quad Stretch Right;3 reps;30 seconds;Other (comment)  with strap   Knee/Hip Exercises: Aerobic   Recumbent Bike 54min warmup   Knee/Hip Exercises: Machines for  Strengthening   Cybex Leg Press 1 plate, 1 set x10, 1 set x 12 eccentric control, 1 set SL push with Rt. foot x 10 eccentric control   Knee/Hip Exercises: Standing   Lateral Step Up Right;1 set;Hand Hold: 1;Step Height: 4";Other (comment)  8 reps un able to tolerate today stopped exercise   Forward Step Up Right;2 sets;10 reps;Hand Hold: 1;Step Height: 4"   Step Down Right;2 sets;Hand Hold: 1;Step Height: 4";Other (comment)  8 reps/eac   Knee/Hip Exercises: Supine   Straight Leg Raises Strengthening;Right;2 sets;10 reps;Other (comment)   Knee/Hip Exercises: Sidelying   Hip ABduction Strengthening;Right;3 sets;15 reps   Knee/Hip Exercises: Prone   Hamstring Curl 3 sets;10 reps  4lb ankle weight   Cryotherapy   Number Minutes Cryotherapy 10 Minutes   Cryotherapy Location Knee   Type of Cryotherapy Ice pack                PT Education - 09/12/14 0845    Education provided No          PT Short Term Goals - 09/07/14 0737    PT SHORT TERM GOAL #1   Title I with initial HEP   Time 1   Period Weeks   Status Achieved   PT SHORT TERM GOAL #2   Title report a decrease in pain at rest  from 6/10 to 3/10   Baseline 6/10 highest pain yesterday   Time 4   Period Weeks   Status On-going   PT SHORT TERM GOAL #3   Title Demonstrate and verbalize understanding of condition management including RICE, positioning to prevent hyperextension at rest, and HEP   Baseline Understands and adhers with these   Time 4   Period Weeks   Status Achieved   PT SHORT TERM GOAL #4   Title Pt reports ability to participate in her standing ab workout video for 72min without increased pain   Baseline has not tried due to pain   Time 4   Period Weeks   Status On-going   PT SHORT TERM GOAL #5   Title pt demonstrates and verbalizes approprite gait sequence up and down curbs/stairs to alleviate increases in knee pain   Baseline Avoids steps for now due to pain   Time 4   Period Weeks   Status  On-going   PT SHORT TERM GOAL #6   Title Patient will demo increased strength in Hip/knee motions to 4+/5   Time 4   Period Weeks   Status On-going           PT Long Term Goals - 09/12/14 0846    PT LONG TERM GOAL #1   Title Pt is able to tolerate elliptical/TM 68minutes without increased knee pain in order to workout again at home   Time 8   Period Weeks   Status On-going   PT LONG TERM GOAL #2   Title Pt reports ability to walk 2 hours in order to go to [place] and enjoy her walks there with family   Time 8   Period Weeks   Status On-going   PT LONG TERM GOAL #3   Title I with advanced HEP   Time 8   Period Weeks   Status On-going   PT LONG TERM GOAL #4   Title PAIN will decreased to 3/10 with all functional activities in order to return to work   Time 8   Period Weeks   Status On-going   PT LONG TERM GOAL #5   Title pt demo increased strength to 4+/5 R knee without reports of pain during MMT   Time 8   Period Weeks   Status On-going   PT LONG TERM GOAL #6   Title perform SLR with extension lag less than or equal to 10 degrees   Time 8   Period Weeks   Status On-going               Plan - 09/12/14 1035    Clinical Impression Statement Pt. is still working towards her goals, she no longer has constant pain it is now intermittent. She was progressed to warming up on the recumbent bike in order to prepare for other aerobic exercises machines to meet her elliptical goal in order to use her personal one at home. She istated she will be trying her hip hop abs video today to see if she can do some of it now.     PT Next Visit Plan wall sits (mini squats), steps, leg press emphasis on eccentric control recumbent bike   PT Home Exercise Plan recently given new exercises instructed to continue with those   Consulted and Agree with Plan of Care Patient        Problem List Patient Active Problem List   Diagnosis Date Noted  . Arthritis of right lower  extremity  07/05/2014  . Right knee pain 04/17/2014  . Unspecified sleep apnea 06/02/2013  . Vertigo 06/02/2013  . Essential hypertension 06/14/2012  . Diabetes mellitus type 2, controlled, without complications 21/11/7354  . Obesity, morbid 06/14/2012   Radonna Ricker, SPT PAA,JENNIFER 09/12/2014, 1:54 PM  [facility] [address] [place], [place], [address] Phone: [phone]   Fax:  [phone]   Raeford Razor, PT 09/12/2014 1:54 PM Phone: [phone] Fax: [phone]

## 2014-09-14 ENCOUNTER — Ambulatory Visit: Payer: BLUE CROSS/BLUE SHIELD | Admitting: Physical Therapy

## 2014-09-14 ENCOUNTER — Ambulatory Visit (INDEPENDENT_AMBULATORY_CARE_PROVIDER_SITE_OTHER): Payer: BLUE CROSS/BLUE SHIELD | Admitting: Family Medicine

## 2014-09-14 ENCOUNTER — Encounter: Payer: Self-pay | Admitting: Family Medicine

## 2014-09-14 ENCOUNTER — Other Ambulatory Visit (INDEPENDENT_AMBULATORY_CARE_PROVIDER_SITE_OTHER): Payer: BLUE CROSS/BLUE SHIELD

## 2014-09-14 VITALS — BP 120/84 | HR 85 | Ht 60.0 in | Wt 300.0 lb

## 2014-09-14 DIAGNOSIS — M129 Arthropathy, unspecified: Secondary | ICD-10-CM

## 2014-09-14 DIAGNOSIS — M25561 Pain in right knee: Secondary | ICD-10-CM

## 2014-09-14 DIAGNOSIS — IMO0002 Reserved for concepts with insufficient information to code with codable children: Secondary | ICD-10-CM

## 2014-09-14 DIAGNOSIS — S8981XS Other specified injuries of right lower leg, sequela: Secondary | ICD-10-CM

## 2014-09-14 DIAGNOSIS — M1711 Unilateral primary osteoarthritis, right knee: Secondary | ICD-10-CM

## 2014-09-14 NOTE — Therapy (Signed)
[place], [place], [address] Phone: [phone]   Fax:  [phone]  Physical Therapy Treatment  Patient Details  Name: Lynn Fields MRN: [id number] Date of Birth: 12-22-1970 Referring Provider:  Olga Millers, MD  Encounter Date: 09/14/2014      PT End of Session - 09/14/14 0801    Visit Number 7   Number of Visits 16   Date for PT Re-Evaluation 10/18/14   PT Start Time 0731   PT Stop Time 0801   PT Time Calculation (min) 30 min   Activity Tolerance Patient tolerated treatment well;No increased pain   Behavior During Therapy [facility] for tasks assessed/performed      Past Medical History  Diagnosis Date  . Diabetes mellitus without complication   . Hypertension   . Migraines   . Depression   . Ulcer     Past Surgical History  Procedure Laterality Date  . Cesarean section  1993  . Tubal ligation  1993    There were no vitals filed for this visit.  Visit Diagnosis:  Knee joint pain, right  Osteoarthritis of right knee, unspecified osteoarthritis type  Knee hyperextension injury, right, sequela      Subjective Assessment - 09/14/14 0739    Subjective No pain.  Likes brace.   Currently in Pain? No/denies   Multiple Pain Sites No                         [facility] Adult PT Treatment/Exercise - 09/14/14 0739    Knee/Hip Exercises: Aerobic   Stationary Bike Recumbant Bike emphasis on eccentric control.   Knee/Hip Exercises: Standing   Heel Raises 10 reps  single leg    Lateral Step Up Right;20 reps  wearing brace   Forward Step Up 15 reps  2 sets with brace   Wall Squat 10 reps  cues, brace   Other Standing Knee Exercises terminal knee band blue 10 reps 3 positions.                PT Education - 09/14/14 0800    Education provided Yes   Education Details terminal knee   Person(s) Educated Patient   Methods Explanation;Demonstration;Tactile cues;Verbal  cues;Handout   Comprehension Verbalized understanding;Returned demonstration          PT Short Term Goals - 09/14/14 0802    PT SHORT TERM GOAL #1   Title I with initial HEP   Time 1   Period Weeks   Status Achieved   PT SHORT TERM GOAL #2   Title report a decrease in pain at rest from 6/10 to 3/10   Baseline 0 pain today ? consistant?   Time 4   Period Weeks   Status Partially Met   PT SHORT TERM GOAL #3   Title Demonstrate and verbalize understanding of condition management including RICE, positioning to prevent hyperextension at rest, and HEP   Status Achieved   PT SHORT TERM GOAL #4   Time 4   Period Weeks   Status Unable to assess   PT SHORT TERM GOAL #5   Title pt demonstrates and verbalizes approprite gait sequence up and down curbs/stairs to alleviate increases in knee pain   Baseline can do in clinic   Time 4   Period Weeks   Status On-going           PT Long Term Goals - 09/12/14 0846    PT LONG  TERM GOAL #1   Title Pt is able to tolerate elliptical/TM 49mnutes without increased knee pain in order to workout again at home   Time 8   Period Weeks   Status On-going   PT LONG TERM GOAL #2   Title Pt reports ability to walk 2 hours [place]   Time 8   Period Weeks   Status On-going   PT LONG TERM GOAL #3   Title I with advanced HEP   Time 8   Period Weeks   Status On-going   PT LONG TERM GOAL #4   Title PAIN will decreased to 3/10 with all functional activities in order to return to work   Time 8   Period Weeks   Status On-going   PT LONG TERM GOAL #5   Title pt demo increased strength to 4+/5 R knee without reports of pain during MMT   Time 8   Period Weeks   Status On-going   PT LONG TERM GOAL #6   Title perform SLR with extension lag less than or equal to 10 degrees   Time 8   Period Weeks   Status On-going               Plan - 09/14/14 0801    Clinical Impression  Statement race fits and works well .  No pain with session.  Progress with home ex.  Progress toward closed chain   PT Next Visit Plan closed chain.  Stretch.  Hamstring strength    PT Home Exercise Plan terminal knee, band blue        Problem List Patient Active Problem List   Diagnosis Date Noted  . Arthritis of right lower extremity 07/05/2014  . Right knee pain 04/17/2014  . Unspecified sleep apnea 06/02/2013  . Vertigo 06/02/2013  . Essential hypertension 06/14/2012  . Diabetes mellitus type 2, controlled, without complications 030/86/5784 . Obesity, morbid 06/14/2012    [facility]8/18/2016, 8:04 AM  [facility] [address]Phone: [phone]  Fax:  [phone]    KMelvenia Needles PTA 09/14/2014 8:04 AM Phone: [phone]: [phone]

## 2014-09-14 NOTE — Patient Instructions (Signed)
Verbal instructions given

## 2014-09-14 NOTE — Assessment & Plan Note (Signed)
Patient finish the Orthovisc series at this time. Patient with continue conservative therapy and wear the custom brace. Patient will come back and see me again in 1 month for further evaluation.

## 2014-09-14 NOTE — Patient Instructions (Signed)
Knee Extension: Terminal - Standing (Single Leg)   Face anchor in shoulder width stance, band around knee. Allow tension of band to slightly bend knee. Pull leg back, straightening knee. Repeat 10__ times per set. Repeat with other leg. Do3 __ sets per session. Do 4__ sessions per week.+++3 positions Anchor Height: Knee  [email or web address]   Copyright  VHI. All rights reserved.

## 2014-09-14 NOTE — Progress Notes (Signed)
Pre visit review using our clinic review tool, if applicable. No additional management support is needed unless otherwise documented below in the visit note. 

## 2014-09-14 NOTE — Progress Notes (Signed)
[name] [place], [place] [address] Phone: [phone] Subjective:    I'm seeing this patient by the request  of:  Olga Millers, MD   CC: Right knee pain follow-up  [id number] Lynn Fields is a 44 y.o. female coming in with complaint of right knee pain. Patient was found to have moderate arthritis in the knee. Patient was seen previously and was given topical anti-inflamed worse, icing protocol, and patient was given an injection. Patient was to do home exercises. Patient continued have pain and we did start Orthovisc. Patient is here for 4th injection in a series of 4. Patient states physical therapy is helping. Patient continues to make very mild strides. Patient also got her custom brace and states that it is feeling significant a better.      Past Medical History  Diagnosis Date  . Diabetes mellitus without complication   . Hypertension   . Migraines   . Depression   . Ulcer    Past Surgical History  Procedure Laterality Date  . Cesarean section  1993  . Tubal ligation  1993   Social History  Substance Use Topics  . Smoking status: Never Smoker   . Smokeless tobacco: Never Used  . Alcohol Use: No   Allergies  Allergen Reactions  . Codeine Itching  . Morphine And Related Itching  . Penicillins Swelling  . Sudafed [Pseudoephedrine Hcl] Swelling  . Azithromycin Rash  . Erythromycin Rash   Filed Weights   09/14/14 1542  Weight: 300 lb (136.079 kg)     Past medical history, social, surgical and family history all reviewed in electronic medical record.   Review of Systems: No headache, visual changes, nausea, vomiting, diarrhea, constipation, dizziness, abdominal pain, skin rash, fevers, chills, night sweats, weight loss, swollen lymph nodes, body aches, joint swelling, muscle aches, chest pain, shortness of breath, mood changes.   Objective Blood pressure 120/84, pulse 85, height 5' (1.524 m), weight 300 lb  (136.079 kg), SpO2 98 %.  General: No apparent distress alert and oriented x3 mood and affect normal, dressed appropriately.  HEENT: Pupils equal, extraocular movements intact  Respiratory: Patient's speak in full sentences and does not appear short of breath  Cardiovascular: No lower extremity edema, non tender, no erythema  Skin: Warm dry intact with no signs of infection or rash on extremities or on axial skeleton.  Abdomen: Soft nontender  Neuro: Cranial nerves II through XII are intact, neurovascularly intact in all extremities with 2+ DTRs and 2+ pulses.  Lymph: No lymphadenopathy of posterior or anterior cervical chain or axillae bilaterally.  Gait normal with good balance and coordination.  MSK:  Non tender with full range of motion and good stability and symmetric strength and tone of shoulders, elbows, wrist, hip, and ankles bilaterally.  Knee: Right Difficult to assess secondary to patient's body habitus Tender to palpation over the medial joint linestill present ROM full in flexion and extension and lower leg rotation. Ligaments with solid consistent endpoints including ACL, PCL, LCL, MCL. Negative Mcmurray's, Apley's, and Thessalonian tests. mild painful patellar compression. Patellar glide with mild crepitus. Patellar and quadriceps tendons unremarkable. Hamstring and quadriceps strength is normal.  No change from previous exam  Procedure: Real-time Ultrasound Guided Injection of right knee Device: GE Logiq E  Ultrasound guided injection is preferred based studies that show increased duration, increased effect, greater accuracy, decreased procedural pain, increased response rate, and decreased cost with ultrasound guided versus blind injection.  Verbal informed consent obtained.  Time-out conducted.  Noted no overlying erythema, induration, or other signs of local infection.  Skin prepped in a sterile fashion.  Local anesthesia: Topical Ethyl chloride.  With sterile  technique and under real time ultrasound guidance: With a 22-gauge 2 inch needle patient was injected with15 mg/2.5 mL of Orthovisc (sodium hyaluronate) in a prefilled syringe was injected easily into the knee through a 22-gauge needle.. This was from a superior lateral approach.  Completed without difficulty  Pain immediately resolved suggesting accurate placement of the medication.  Advised to call if fevers/chills, erythema, induration, drainage, or persistent bleeding.  Images permanently stored and available for review in the ultrasound unit.  Impression: Technically successful ultrasound guided injection.           Impression and Recommendations:     This case required medical decision making of moderate complexity.

## 2014-09-19 ENCOUNTER — Ambulatory Visit: Payer: BLUE CROSS/BLUE SHIELD | Admitting: Physical Therapy

## 2014-09-19 DIAGNOSIS — S8981XS Other specified injuries of right lower leg, sequela: Secondary | ICD-10-CM

## 2014-09-19 DIAGNOSIS — M25561 Pain in right knee: Secondary | ICD-10-CM

## 2014-09-19 DIAGNOSIS — M1711 Unilateral primary osteoarthritis, right knee: Secondary | ICD-10-CM

## 2014-09-19 NOTE — Therapy (Signed)
[place], [place], [address] Phone: [phone]   Fax:  [phone]  Physical Therapy Treatment  Patient Details  Name: Lynn Fields MRN: [id number] Date of Birth: [birth year] Referring Provider:  Olga Millers, MD  Encounter Date: 09/19/2014      PT End of Session - 09/19/14 0802    Visit Number 8   Number of Visits 16   Date for PT Re-Evaluation 10/18/14   PT Start Time 0755   PT Stop Time 0835   PT Time Calculation (min) 40 min   Activity Tolerance Patient tolerated treatment well   Behavior During Therapy [facility] for tasks assessed/performed      Past Medical History  Diagnosis Date  . Diabetes mellitus without complication   . Hypertension   . Migraines   . Depression   . Ulcer     Past Surgical History  Procedure Laterality Date  . Cesarean section  1993  . Tubal ligation  1993    There were no vitals filed for this visit.  Visit Diagnosis:  Osteoarthritis of right knee, unspecified osteoarthritis type  Knee joint pain, right  Knee hyperextension injury, right, sequela      Subjective Assessment - 09/19/14 0800    Subjective Pt. is having some pain today. Pt wears brace when she is out in the community, takes it off when at home. She has began working out at home again, including her bike and elliptical. The elliptical still causes some pain for her but she pushes through and keeps going.   Currently in Pain? Yes   Pain Score 6    Pain Location Knee   Pain Orientation Right           [facility] Adult PT Treatment/Exercise - 09/19/14 0805    Knee/Hip Exercises: Stretches   Active Hamstring Stretch Right;3 reps;20 seconds  2 sets with knee bent x20sec   Quad Stretch Right;3 reps;30 seconds   Gastroc Stretch Right;2 reps;30 seconds   Knee/Hip Exercises: Aerobic   Stationary Bike recumbent, 5 min   Knee/Hip Exercises: Machines for Strengthening   Cybex Knee Flexion 2  plates, 2 sets x15   Cybex Leg Press 1 plate, 1 set x15 normal speed, 1 set x15 emphasis on eccentric control   Knee/Hip Exercises: Standing   Lateral Step Up Right;Both;1 set;20 reps;Hand Hold: 1;Step Height: 4";Other (comment)  leadign with right LE   Forward Step Up Right;1 set;20 reps;Step Height: 4"   Step Down 1 set;20 reps;Hand Hold: 1;Step Height: 4"  10-rest-10,    Other Standing Knee Exercises HAMSTRING CURLS, 6# ankle weight, 2 sets x10                PT Education - 09/19/14 0842    Education provided Yes   Education Details HEP   Person(s) Educated Patient   Methods Explanation;Handout   Comprehension Verbalized understanding          PT Short Term Goals - 09/19/14 0905    PT SHORT TERM GOAL #1   Title I with initial HEP   Time 1   Period Weeks   Status Achieved   PT SHORT TERM GOAL #2   Title report a decrease in pain at rest from 6/10 to 3/10   Time 4   Period Weeks   Status On-going   PT SHORT TERM GOAL #3   Title Demonstrate and verbalize understanding of condition management including RICE, positioning to prevent hyperextension at rest, and  HEP   Time 4   Period Weeks   Status Achieved   PT SHORT TERM GOAL #4   Title Pt reports ability to participate in her standing ab workout video for 28mn without increased pain   Time 4   Period Weeks   Status Partially Met   PT SHORT TERM GOAL #5   Title pt demonstrates and verbalizes approprite gait sequence up and down curbs/stairs to alleviate increases in knee pain   Time 4   Period Weeks   Status On-going   PT SHORT TERM GOAL #6   Title Patient will demo increased strength in Hip/knee motions to 4+/5   Time 4   Period Weeks   Status On-going           PT Long Term Goals - 09/19/14 0907    PT LONG TERM GOAL #1   Title Pt is able to tolerate elliptical/TM 174mutes without increased knee pain in order to workout again at home   Time 8   Period Weeks   Status Partially Met   PT LONG  TERM GOAL #2   Title Pt reports ability to walk 2 hours [place]   Time 8   Period Weeks   Status On-going   PT LONG TERM GOAL #3   Title I with advanced HEP   Time 8   Period Weeks   Status On-going   PT LONG TERM GOAL #4   Title PAIN will decreased to 3/10 with all functional activities in order to return to work   Time 8   Period Weeks   Status On-going   PT LONG TERM GOAL #5   Title pt demo increased strength to 4+/5 R knee without reports of pain during MMT   Time 8   Period Weeks   Status On-going   PT LONG TERM GOAL #6   Title perform SLR with extension lag less than or equal to 10 degrees   Time 8   Period Weeks   Status On-going               Plan - 09/19/14 0843    Clinical Impression Statement Pt. is progressing well through therapy sessions. The only time pt. c/o pain is when performing eccentric  lowering off of step. She wore her brace through all exercises today said she felt more stable. Brace was removed during stretching at the end. Pt. would benefit from continued PT services to wor towards achieving all goals.   PT Next Visit Plan assess Goals and measure/MMT/edema, closed chain strengthening, exercises w/ eccentric control , stretch   PT Home Exercise Plan terminal knee extension, bridge, prone knee flexion with blue band    Consulted and Agree with Plan of Care Patient        Problem List Patient Active Problem List   Diagnosis Date Noted  . Arthritis of right lower extremity 07/05/2014  . Right knee pain 04/17/2014  . Unspecified sleep apnea 06/02/2013  . Vertigo 06/02/2013  . Essential hypertension 06/14/2012  . Diabetes mellitus type 2, controlled, without complications 0514/78/2956. Obesity, morbid 06/14/2012   Physical Therapy Progress Note  Dates of Reporting Period: 08/23/14 to 10/18/14  Objective Reports of Subjective Statement: Pt. Reports knee pain with each visit however  she is able to push through all exercises without c/o pain except when performing eccentrically controlled exercises. She is liking her brace but it is  difficult for her to put it on especially when her knee is swollen. Pt. Is more stable with the brace on during therapy sessions.  Objective Measurements: Not assessed at this time however knee hyperextension is still very evident without the brace, with the brace not as much.  Goal Update: Pt continues to work towards all goals. She has not specifically tried her Hip hop abs workout video but she is using all of her other workout equipment at home. She is using her elliptical again but it still causes pain she is just continues to push through it.  Plan: Pt. Has progressed to closed chain exercises and will continue to progress through more. Treatment sessions will focus on eccentric lowering of Rt. Knee and hamstring strengthening. Assessment of pt. ROM, strength, and edema at next visit.  Reason Skilled Services are Required: Pt. Would benefit from skilled PT services to continue progressing towards meeting all of her goals improving LE strength and decreasing pain in order to return to PLOF.  She also is benefiting from continual input and guidance with respect to brace, managing pain.    Radonna Ricker, SPT   Radonna Ricker 09/19/2014, 9:10 AM  [facility] [address] [place], [place], [address] Phone: [phone]   Fax:  [phone]     Raeford Razor, PT 09/19/2014 10:16 AM Phone: [phone] Fax: [phone]

## 2014-09-19 NOTE — Patient Instructions (Signed)
(  Clinic) Knee: Flexion / Hamstring Curl - Prone   Pulley beyond feet, lie with strap around left ankle. Pull foot toward buttocks. Repeat 10 times per set. Do 2 sets per session.  Use [employer]   Copyright  VHI. All rights reserved.  Knee Extension: Terminal - Standing (Single Leg)   Face anchor in shoulder width stance, band around knee. Allow tension of band to slightly bend knee. Pull leg back, straightening knee. Repeat 10 times per set. Repeat with other leg. Do 2 sets per session.  Anchor Height: Knee  [email or web address].exer.[email or web address]   Copyright  VHI. All rights reserved.  Bridge   Lie back, legs bent. Inhale, pressing hips up. Keeping ribs in, lengthen lower back. Exhale, rolling down along spine from top. Repeat 10 times. Do 2 sessions per day.  Copyright  VHI. All rights reserved.

## 2014-09-20 ENCOUNTER — Ambulatory Visit (INDEPENDENT_AMBULATORY_CARE_PROVIDER_SITE_OTHER): Payer: BLUE CROSS/BLUE SHIELD | Admitting: Internal Medicine

## 2014-09-20 ENCOUNTER — Encounter: Payer: Self-pay | Admitting: Internal Medicine

## 2014-09-20 ENCOUNTER — Other Ambulatory Visit (INDEPENDENT_AMBULATORY_CARE_PROVIDER_SITE_OTHER): Payer: BLUE CROSS/BLUE SHIELD

## 2014-09-20 VITALS — BP 112/88 | HR 80 | Temp 98.4°F | Resp 12 | Ht 60.0 in | Wt 306.0 lb

## 2014-09-20 DIAGNOSIS — Z23 Encounter for immunization: Secondary | ICD-10-CM

## 2014-09-20 DIAGNOSIS — Z418 Encounter for other procedures for purposes other than remedying health state: Secondary | ICD-10-CM | POA: Diagnosis not present

## 2014-09-20 DIAGNOSIS — I10 Essential (primary) hypertension: Secondary | ICD-10-CM

## 2014-09-20 DIAGNOSIS — E119 Type 2 diabetes mellitus without complications: Secondary | ICD-10-CM

## 2014-09-20 DIAGNOSIS — Z299 Encounter for prophylactic measures, unspecified: Secondary | ICD-10-CM

## 2014-09-20 LAB — BASIC METABOLIC PANEL
BUN: 10 mg/dL (ref 6–23)
CHLORIDE: 103 meq/L (ref 96–112)
CO2: 28 mEq/L (ref 19–32)
CREATININE: 0.75 mg/dL (ref 0.40–1.20)
Calcium: 8.8 mg/dL (ref 8.4–10.5)
GFR: 109.04 mL/min (ref >60.00)
Glucose, Bld: 125 mg/dL — ABNORMAL HIGH (ref 70–99)
Potassium: 3.7 mEq/L (ref 3.5–5.1)
Sodium: 138 mEq/L (ref 135–145)

## 2014-09-20 LAB — HEMOGLOBIN A1C: Hgb A1c MFr Bld: 6.9 % — ABNORMAL HIGH (ref 4.6–6.5)

## 2014-09-20 MED ORDER — LISINOPRIL-HYDROCHLOROTHIAZIDE 10-12.5 MG PO TABS: 1.0000 | ORAL_TABLET | Freq: Every day | ORAL | Status: DC

## 2014-09-20 MED ORDER — CITALOPRAM HYDROBROMIDE 10 MG PO TABS: 10.0000 mg | ORAL_TABLET | Freq: Every day | ORAL | Status: DC

## 2014-09-20 MED ORDER — METFORMIN HCL 500 MG PO TABS: 500.0000 mg | ORAL_TABLET | Freq: Every day | ORAL | Status: DC

## 2014-09-20 NOTE — Progress Notes (Signed)
Pre visit review using our clinic review tool, if applicable. No additional management support is needed unless otherwise documented below in the visit note. 

## 2014-09-20 NOTE — Progress Notes (Signed)
   Subjective:    Patient ID: Lynn Fields, female    DOB: 05-Oct-1970, 44 y.o.   MRN: [id number]  HPI The patient is a 44 YO female coming in for follow up of her diabetes (well controlled on metformin daily, no complications, has had for several years, associated with her morbid obesity) as well as a new problem. She is having morning headaches for the last several weeks, generally go away on their own throughout the day. She is not well rested even when she sleeps enough. She does snore and has for many years.   Review of Systems  Constitutional: Positive for activity change. Negative for fever, appetite change and fatigue.       Exercising less  Respiratory: Negative for cough, chest tightness, shortness of breath and wheezing.   Cardiovascular: Negative for chest pain, palpitations and leg swelling.  Gastrointestinal: Negative for abdominal pain, diarrhea, constipation and abdominal distention.  Musculoskeletal: Positive for arthralgias and gait problem.  Neurological: Negative for weakness, light-headedness and headaches.      Objective:   Physical Exam  Constitutional: She is oriented to person, place, and time. She appears well-developed and well-nourished.  Overweight  HENT:  Head: Normocephalic and atraumatic.  Eyes: EOM are normal.  Neck: Normal range of motion.  Cardiovascular: Normal rate and regular rhythm.   Pulmonary/Chest: Effort normal and breath sounds normal. No respiratory distress. She has no wheezes. She has no rales.  Abdominal: Soft. Bowel sounds are normal.  Musculoskeletal: Normal range of motion. She exhibits tenderness.  Right knee in brace  Neurological: She is alert and oriented to person, place, and time. Coordination normal.  Skin: Skin is warm and dry.  Psychiatric: She has a normal mood and affect.   Filed Vitals:   09/20/14 1017  BP: 112/88  Pulse: 80  Temp: 98.4 F (36.9 C)  TempSrc: Oral  Resp: 12  Height: 5' (1.524 m)  Weight: 306 lb  (138.801 kg)  SpO2: 98%      Assessment & Plan:

## 2014-09-20 NOTE — Patient Instructions (Signed)
We will check the home sleep test to check some any signs of sleep apnea causing the headaches and the tiredness.   We are checking the blood work today and will call you back with the results.   Come back in about 6 months if you are doing well, if you are having problems or questions before then please feel free to call us.   Diabetes and Exercise Exercising regularly is important. It is not just about losing weight. It has many health benefits, such as:  Improving your overall fitness, flexibility, and endurance.  Increasing your bone density.  Helping with weight control.  Decreasing your body fat.  Increasing your muscle strength.  Reducing stress and tension.  Improving your overall health. People with diabetes who exercise gain additional benefits because exercise:  Reduces appetite.  Improves the body's use of blood sugar (glucose).  Helps lower or control blood glucose.  Decreases blood pressure.  Helps control blood lipids (such as cholesterol and triglycerides).  Improves the body's use of the hormone insulin by:  Increasing the body's insulin sensitivity.  Reducing the body's insulin needs.  Decreases the risk for heart disease because exercising:  Lowers cholesterol and triglycerides levels.  Increases the levels of good cholesterol (such as high-density lipoproteins [HDL]) in the body.  Lowers blood glucose levels. YOUR ACTIVITY PLAN  Choose an activity that you enjoy and set realistic goals. Your health care provider or diabetes educator can help you make an activity plan that works for you. Exercise regularly as directed by your health care provider. This includes:  Performing resistance training twice a week such as push-ups, sit-ups, lifting weights, or using resistance bands.  Performing 150 minutes of cardio exercises each week such as walking, running, or playing sports.  Staying active and spending no more than 90 minutes at one time being  inactive. Even short bursts of exercise are good for you. Three 10-minute sessions spread throughout the day are just as beneficial as a single 30-minute session. Some exercise ideas include:  Taking the dog for a walk.  Taking the stairs instead of the elevator.  Dancing to your favorite song.  Doing an exercise video.  Doing your favorite exercise with a friend. RECOMMENDATIONS FOR EXERCISING WITH TYPE 1 OR TYPE 2 DIABETES   Check your blood glucose before exercising. If blood glucose levels are greater than 240 mg/dL, check for urine ketones. Do not exercise if ketones are present.  Avoid injecting insulin into areas of the body that are going to be exercised. For example, avoid injecting insulin into:  The arms when playing tennis.  The legs when jogging.  Keep a record of:  Food intake before and after you exercise.  Expected peak times of insulin action.  Blood glucose levels before and after you exercise.  The type and amount of exercise you have done.  Review your records with your health care provider. Your health care provider will help you to develop guidelines for adjusting food intake and insulin amounts before and after exercising.  If you take insulin or oral hypoglycemic agents, watch for signs and symptoms of hypoglycemia. They include:  Dizziness.  Shaking.  Sweating.  Chills.  Confusion.  Drink plenty of water while you exercise to prevent dehydration or heat stroke. Body water is lost during exercise and must be replaced.  Talk to your health care provider before starting an exercise program to make sure it is safe for you. Remember, almost any type of activity is  better than none. Document Released: 04/05/2003 Document Revised: 05/30/2013 Document Reviewed: 06/22/2012 [facility] Patient Information 2015 [place], [place]. This information is not intended to replace advice given to you by your health care provider. Make sure you discuss any questions  you have with your health care provider.

## 2014-09-21 ENCOUNTER — Encounter: Payer: BLUE CROSS/BLUE SHIELD | Admitting: Physical Therapy

## 2014-09-21 NOTE — Assessment & Plan Note (Signed)
Checking HgA1c today, continue metformin daily. No complications, on ACE-I.

## 2014-09-21 NOTE — Assessment & Plan Note (Signed)
Unclear if she does have sleep apnea, has been asked to get evaluated in the past. Will order home sleep study today to evaluate. The morning headaches, snoring, daytime fatigue.

## 2014-09-25 ENCOUNTER — Encounter: Payer: Self-pay | Admitting: Internal Medicine

## 2014-09-26 ENCOUNTER — Ambulatory Visit: Payer: BLUE CROSS/BLUE SHIELD | Admitting: Physical Therapy

## 2014-09-26 ENCOUNTER — Encounter: Payer: BLUE CROSS/BLUE SHIELD | Admitting: Physical Therapy

## 2014-09-28 ENCOUNTER — Encounter: Payer: BLUE CROSS/BLUE SHIELD | Admitting: Physical Therapy

## 2014-09-28 ENCOUNTER — Ambulatory Visit: Payer: BLUE CROSS/BLUE SHIELD | Attending: Family Medicine | Admitting: Physical Therapy

## 2014-09-28 DIAGNOSIS — M179 Osteoarthritis of knee, unspecified: Secondary | ICD-10-CM | POA: Insufficient documentation

## 2014-09-28 DIAGNOSIS — M1711 Unilateral primary osteoarthritis, right knee: Secondary | ICD-10-CM

## 2014-09-28 DIAGNOSIS — S8981XS Other specified injuries of right lower leg, sequela: Secondary | ICD-10-CM | POA: Diagnosis present

## 2014-09-28 DIAGNOSIS — M25561 Pain in right knee: Secondary | ICD-10-CM | POA: Diagnosis present

## 2014-09-28 NOTE — Therapy (Signed)
[place], [place], [address] Phone: [phone]   Fax:  [phone]  Physical Therapy Treatment  Patient Details  Name: Lynn Fields MRN: [id number] Date of Birth: 07-Jan-1971 Referring Provider:  Olga Millers, MD  Encounter Date: 09/28/2014      PT End of Session - 09/28/14 1323    Visit Number 9   Date for PT Re-Evaluation 10/18/14   PT Start Time 0850   PT Stop Time 0947   PT Time Calculation (min) 57 min   Activity Tolerance Patient tolerated treatment well;No increased pain   Behavior During Therapy [facility] for tasks assessed/performed      Past Medical History  Diagnosis Date  . Diabetes mellitus without complication   . Hypertension   . Migraines   . Depression   . Ulcer     Past Surgical History  Procedure Laterality Date  . Cesarean section  1993  . Tubal ligation  1993    There were no vitals filed for this visit.  Visit Diagnosis:  Osteoarthritis of right knee, unspecified osteoarthritis type  Knee joint pain, right  Knee hyperextension injury, right, sequela                       [facility] Adult PT Treatment/Exercise - 09/28/14 0855    Knee/Hip Exercises: Standing   Lateral Step Up Right;Both;1 set;10 reps;Hand Hold: 1;Step Height: 6";Other (comment)  leadign with right LE   Forward Step Up Right;1 set;20 reps;Step Height: 6"   Step Down 1 set;10 reps;Hand Hold: 1;Step Height: 6"  10-rest-10,    Other Standing Knee Exercises Moving lrg on pillow case while standing on leg with brace supporting,  CGA no hnads 10X 3 way.  cues.     Other Standing Knee Exercises sit to stand 10 rep less pops with slight ER no hands.   Knee/Hip Exercises: Supine   Quad Sets 10 reps   Knee/Hip Exercises: Sidelying   Hip ABduction Strengthening   Hip ABduction Limitations , MMT 4-/5 RT   Clams 10   Cryotherapy   Number Minutes Cryotherapy 10 Minutes   Cryotherapy Location  Knee   Type of Cryotherapy --  cold pack                  PT Short Term Goals - 09/28/14 1543    PT SHORT TERM GOAL #1   Title I with initial HEP   Time 1   Period Weeks   Status Achieved   PT SHORT TERM GOAL #2   Title report a decrease in pain at rest from 6/10 to 3/10   Baseline pain varies depending on how much she is up.   Time 4   Period Weeks   Status On-going   PT SHORT TERM GOAL #3   Title Demonstrate and verbalize understanding of condition management including RICE, positioning to prevent hyperextension at rest, and HEP   Status Achieved   PT SHORT TERM GOAL #4   Title Pt reports ability to participate in her standing ab workout video for 44mn without increased pain   Time 4   Status Unable to assess   PT SHORT TERM GOAL #5   Title pt demonstrates and verbalizes approprite gait sequence up and down curbs/stairs to alleviate increases in knee pain   Baseline can do in clinic   Time 4   Status On-going   PT SHORT TERM GOAL #6   Title Patient  will demo increased strength in Hip/knee motions to 4+/5   Time 4           PT Long Term Goals - 09/19/14 0907    PT LONG TERM GOAL #1   Title Pt is able to tolerate elliptical/TM 47mnutes without increased knee pain in order to workout again at home   Time 8   Period Weeks   Status Partially Met   PT LONG TERM GOAL #2   Title Pt reports ability to walk 2 [place]   Time 8   Period Weeks   Status On-going   PT LONG TERM GOAL #3   Title I with advanced HEP   Time 8   Period Weeks   Status On-going   PT LONG TERM GOAL #4   Title PAIN will decreased to 3/10 with all functional activities in order to return to work   Time 8   Period Weeks   Status On-going   PT LONG TERM GOAL #5   Title pt demo increased strength to 4+/5 R knee without reports of pain during MMT   Time 8   Period Weeks   Status On-going   PT LONG TERM GOAL #6   Title  perform SLR with extension lag less than or equal to 10 degrees   Time 8   Period Weeks   Status On-going               Plan - 09/28/14 1542    PT Next Visit Plan FOTO MMT Measure edema , goals, closed chain   Consulted and Agree with Plan of Care Patient        Problem List Patient Active Problem List   Diagnosis Date Noted  . Arthritis of right lower extremity 07/05/2014  . Right knee pain 04/17/2014  . Unspecified sleep apnea 06/02/2013  . Vertigo 06/02/2013  . Essential hypertension 06/14/2012  . Diabetes mellitus type 2, controlled, without complications 097/02/6376 . Obesity, morbid 06/14/2012    [facility]/2014, 6:03 PM  [facility] [address]Phone: [phone]  Fax:  [phone]    KMelvenia Needles PTA 09/28/2014 6:03 PM Phone: [phone]: [phone]

## 2014-10-03 ENCOUNTER — Encounter: Payer: BLUE CROSS/BLUE SHIELD | Admitting: Physical Therapy

## 2014-10-03 ENCOUNTER — Ambulatory Visit: Payer: BLUE CROSS/BLUE SHIELD | Admitting: Physical Therapy

## 2014-10-03 DIAGNOSIS — S8981XS Other specified injuries of right lower leg, sequela: Secondary | ICD-10-CM

## 2014-10-03 DIAGNOSIS — M25561 Pain in right knee: Secondary | ICD-10-CM

## 2014-10-03 DIAGNOSIS — M1711 Unilateral primary osteoarthritis, right knee: Secondary | ICD-10-CM

## 2014-10-03 DIAGNOSIS — M179 Osteoarthritis of knee, unspecified: Secondary | ICD-10-CM | POA: Diagnosis not present

## 2014-10-03 NOTE — Patient Instructions (Addendum)
FUNCTIONAL MOBILITY: Wall Squat   Stance: shoulder-width on floor, against wall. Place feet in front of hips. Bend hips and knees. Keep back straight. Do not allow knees to bend past toes. Squeeze glutes and quads to stand. _10__ reps per set, _2__ sets per day, MIN SQUATS ONLY (half the range)  Copyright  VHI. All rights reserved.

## 2014-10-03 NOTE — Therapy (Addendum)
[place], [place], [address] Phone: [phone]   Fax:  [phone]  Physical Therapy Treatment  Patient Details  Name: Lynn Fields MRN: [id number] Date of Birth: [birth year] Referring Provider:  Olga Millers, MD  Encounter Date: 10/03/2014      PT End of Session - 10/03/14 0847    Visit Number 10   Number of Visits 16   Date for PT Re-Evaluation 10/18/14   PT Start Time 7253   PT Stop Time 0857   PT Time Calculation (min) 62 min   Activity Tolerance Patient tolerated treatment well   Behavior During Therapy [facility] for tasks assessed/performed      Past Medical History  Diagnosis Date  . Diabetes mellitus without complication   . Hypertension   . Migraines   . Depression   . Ulcer     Past Surgical History  Procedure Laterality Date  . Cesarean section  1993  . Tubal ligation  1993    There were no vitals filed for this visit.  Visit Diagnosis:  Osteoarthritis of right knee, unspecified osteoarthritis type  Knee joint pain, right  Knee hyperextension injury, right, sequela      Subjective Assessment - 10/03/14 0801    Subjective Pt. states she is not sure what she did last night but the pain hit and it is still present right now just not quite as bad. She thinks she may have turned over wrong possibly.   Patient is accompained by: Family member   Currently in Pain? Yes   Pain Score --  6.5   Pain Location Knee   Pain Orientation Right   Pain Descriptors / Indicators Aching;Throbbing;Shooting  shooting pain every once in a while   Pain Type Chronic pain   Pain Radiating Towards none   Pain Onset More than a month ago   Pain Frequency Constant  since last night   Aggravating Factors  walking   Pain Relieving Factors none right now   Effect of Pain on Daily Activities just painful   Multiple Pain Sites No            [facility] PT Assessment - 10/03/14 0813    Strength   Right Hip Flexion 4+/5   Right Hip Extension 5/5   Right Hip ABduction 4+/5            [facility] Adult PT Treatment/Exercise - 10/03/14 0804    Knee/Hip Exercises: Machines for Strengthening   Total Gym Leg Press 2 sets 2 plates, heels hip width, heels slose together. 1 set SL 1 plate x10   Knee/Hip Exercises: Standing   Lateral Step Up Right;1 set;15 reps;Hand Hold: 1;Step Height: 4"   Forward Step Up Right;1 set;15 reps;Hand Hold: 1;Step Height: 4"   Step Down Right;1 set;10 reps;Hand Hold: 1;Step Height: 4"   Wall Squat 2 sets;10 reps;Other (comment)   Wall Squat Limitations first set mini squats   Knee/Hip Exercises: Seated   Long Arc Quad Right;2 sets;15 reps;Weights   Long Arc Quad Weight 4 lbs.   Sit to Sand 2 sets;15 reps;without UE support  2nd set with ball squeezes   Knee/Hip Exercises: Sidelying   Hip ABduction Strengthening;Right;1 set;15 reps   Knee/Hip Exercises: Prone   Hamstring Curl 2 sets;15 reps;2 seconds   Hamstring Curl Limitations 4#   Cryotherapy   Number Minutes Cryotherapy 15 Minutes   Cryotherapy Location Knee   Type of Cryotherapy Other (comment)  game ready  PT Education - 10/03/14 0846    Education provided Yes   Education Details HEp   Person(s) Educated Patient   Methods Explanation;Handout;Demonstration   Comprehension Verbalized understanding;Returned demonstration          PT Short Term Goals - 10/03/14 0851    PT SHORT TERM GOAL #1   Title I with initial HEP   Time 1   Period Weeks   Status Achieved   PT SHORT TERM GOAL #2   Title report a decrease in pain at rest from 6/10 to 3/10   Baseline pain varies depending on how much she is up.   Time 4   Period Weeks   Status On-going   PT SHORT TERM GOAL #3   Title Demonstrate and verbalize understanding of condition management including RICE, positioning to prevent hyperextension at rest, and HEP   Baseline Understands and adhers with these   Time 4    Period Weeks   Status Achieved   PT SHORT TERM GOAL #4   Title Pt reports ability to participate in her standing ab workout video for 47mn without increased pain   Baseline has not tried due to pain   Time 4   Period Weeks   Status On-going   PT SHORT TERM GOAL #5   Title pt demonstrates and verbalizes approprite gait sequence up and down curbs/stairs to alleviate increases in knee pain   Baseline can do in clinic   Time 4   Period Weeks   Status On-going   PT SHORT TERM GOAL #6   Title Patient will demo increased strength in Hip/knee motions to 4+/5   Time 4   Period Weeks   Status Achieved           PT Long Term Goals - 10/03/14 02440   PT LONG TERM GOAL #1   Title Pt is able to tolerate elliptical/TM 112mutes without increased knee pain in order to workout again at home   Time 8   Period Weeks   Status Partially Met   PT LONG TERM GOAL #2   Title Pt reports ability to walk 2 hours [place]   Time 8   Period Weeks   Status On-going   PT LONG TERM GOAL #3   Title I with advanced HEP   Time 8   PT LONG TERM GOAL #4   Title PAIN will decreased to 3/10 with all functional activities in order to return to work   Time 8   PT LONG TERM GOAL #5   Title pt demo increased strength to 4+/5 R knee without reports of pain during MMT   Time 8   Status Achieved   PT LONG TERM GOAL #6   Title perform SLR with extension lag less than or equal to 10 degrees   Time 8   Period Weeks   Status On-going               Plan - 10/03/14 081027  Clinical Impression Statement Pt has been compliant in wearing her brace. She is using her exercise bike at home, she stopped doing the elliptical for a while. Pt FOTO has improved from 63% limited to 49%.  She has met STG 1,3 and 6 since evaluation and LTG 5. Pt has follow up with her doctor sometime this month could not recall date. Pt would benefit from skilled PT services to  continue  working on functional activities with decreased pain.   PT Next Visit Plan measure edema, closed chain exercises, functional exercises like sit to stand and steps/curb   PT Home Exercise Plan mini wall squat   Consulted and Agree with Plan of Care Patient        Problem List Patient Active Problem List   Diagnosis Date Noted  . Arthritis of right lower extremity 07/05/2014  . Right knee pain 04/17/2014  . Unspecified sleep apnea 06/02/2013  . Vertigo 06/02/2013  . Essential hypertension 06/14/2012  . Diabetes mellitus type 2, controlled, without complications 99/27/8004  . Obesity, morbid 06/14/2012  Radonna Ricker, SPT  PAA,JENNIFER 10/03/2014, 1:27 PM  [facility] [address] [place], [place], [address] Phone: [phone]   Fax:  [phone]    Raeford Razor, PT 10/03/2014 1:28 PM Phone: [phone] Fax: [phone]

## 2014-10-05 ENCOUNTER — Ambulatory Visit: Payer: BLUE CROSS/BLUE SHIELD | Admitting: Physical Therapy

## 2014-10-05 ENCOUNTER — Encounter: Payer: BLUE CROSS/BLUE SHIELD | Admitting: Physical Therapy

## 2014-10-05 DIAGNOSIS — S8981XS Other specified injuries of right lower leg, sequela: Secondary | ICD-10-CM

## 2014-10-05 DIAGNOSIS — M179 Osteoarthritis of knee, unspecified: Secondary | ICD-10-CM | POA: Diagnosis not present

## 2014-10-05 DIAGNOSIS — M25561 Pain in right knee: Secondary | ICD-10-CM

## 2014-10-05 DIAGNOSIS — M1711 Unilateral primary osteoarthritis, right knee: Secondary | ICD-10-CM

## 2014-10-05 NOTE — Therapy (Signed)
[place], [place], [address] Phone: [phone]   Fax:  [phone]  Physical Therapy Treatment  Patient Details  Name: Lynn Fields MRN: [id number] Date of Birth: Jun 23, 1970 Referring Provider:  Olga Millers, MD  Encounter Date: 10/05/2014      PT End of Session - 10/05/14 0841    Visit Number 11   Number of Visits 16   Date for PT Re-Evaluation 10/18/14   PT Start Time 0803   PT Stop Time 0842   PT Time Calculation (min) 39 min   Activity Tolerance Patient tolerated treatment well   Behavior During Therapy [facility] for tasks assessed/performed      Past Medical History  Diagnosis Date  . Diabetes mellitus without complication   . Hypertension   . Migraines   . Depression   . Ulcer     Past Surgical History  Procedure Laterality Date  . Cesarean section  1993  . Tubal ligation  1993    There were no vitals filed for this visit.  Visit Diagnosis:  Osteoarthritis of right knee, unspecified osteoarthritis type  Knee joint pain, right  Knee hyperextension injury, right, sequela      Subjective Assessment - 10/05/14 0809    Subjective 10/10 yesterday walking in grocery store.  7/10 now.  Tried to elevate but could not get it up high enough to help.   Currently in Pain? Yes   Pain Score 7    Pain Location Knee   Pain Orientation Right                         [facility] Adult PT Treatment/Exercise - 10/05/14 0811    Knee/Hip Exercises: Stretches   Passive Hamstring Stretch 3 reps;30 seconds   Gastroc Stretch Both;3 reps;30 seconds   Knee/Hip Exercises: Aerobic   Nustep 5 minutes L 4.     Knee/Hip Exercises: Seated   Sit to [employer] 10 reps   Knee/Hip Exercises: Supine   Quad Sets 10 reps   Short Arc Quad Sets Strengthening;Right;3 sets;10 reps   Short Arc Quad Sets Limitations 5 LBS   Bridges Limitations 10 X   Bridges with Clamshell --  Hooklying ball squeeze 30  reps without bridge   Straight Leg Raises Strengthening;Right;3 sets;10 reps   Straight Leg Raises Limitations 1 set Hip ER                  PT Short Term Goals - 10/03/14 2993    PT SHORT TERM GOAL #1   Title I with initial HEP   Time 1   Period Weeks   Status Achieved   PT SHORT TERM GOAL #2   Title report a decrease in pain at rest from 6/10 to 3/10   Baseline pain varies depending on how much she is up.   Time 4   Period Weeks   Status On-going   PT SHORT TERM GOAL #3   Title Demonstrate and verbalize understanding of condition management including RICE, positioning to prevent hyperextension at rest, and HEP   Baseline Understands and adhers with these   Time 4   Period Weeks   Status Achieved   PT SHORT TERM GOAL #4   Title Pt reports ability to participate in her standing ab workout video for 58mn without increased pain   Baseline has not tried due to pain   Time 4   Period Weeks   Status On-going  PT SHORT TERM GOAL #5   Title pt demonstrates and verbalizes approprite gait sequence up and down curbs/stairs to alleviate increases in knee pain   Baseline can do in clinic   Time 4   Period Weeks   Status On-going   PT SHORT TERM GOAL #6   Title Patient will demo increased strength in Hip/knee motions to 4+/5   Time 4   Period Weeks   Status Achieved           PT Long Term Goals - 10/03/14 6295    PT LONG TERM GOAL #1   Title Pt is able to tolerate elliptical/TM 70mnutes without increased knee pain in order to workout again at home   Time 8   Period Weeks   Status Partially Met   PT LONG TERM GOAL #2   Title Pt reports ability to walk 2 hours [place]   Time 8   Period Weeks   Status On-going   PT LONG TERM GOAL #3   Title I with advanced HEP   Time 8   PT LONG TERM GOAL #4   Title PAIN will decreased to 3/10 with all functional activities in order to return to work   Time 8   PT  LONG TERM GOAL #5   Title pt demo increased strength to 4+/5 R knee without reports of pain during MMT   Time 8   Status Achieved   PT LONG TERM GOAL #6   Title perform SLR with extension lag less than or equal to 10 degrees   Time 8   Period Weeks   Status On-going               Plan - 10/05/14 02841   PT Next Visit Plan measure edema, closed chain exercises, functional exercises like sit to stand and steps/curb   Consulted and Agree with Plan of Care Patient        Problem List Patient Active Problem List   Diagnosis Date Noted  . Arthritis of right lower extremity 07/05/2014  . Right knee pain 04/17/2014  . Unspecified sleep apnea 06/02/2013  . Vertigo 06/02/2013  . Essential hypertension 06/14/2012  . Diabetes mellitus type 2, controlled, without complications 032/44/0102 . Obesity, morbid 06/14/2012    [facility]2014, 8[facility]45 AM  [facility] [place] [address]Phone: [phone]  Fax:  [phone]    KMelvenia Needles PTA 10/05/2014 8:45 AM Phone: [phone]: [phone]

## 2014-10-10 ENCOUNTER — Encounter: Payer: BLUE CROSS/BLUE SHIELD | Admitting: Physical Therapy

## 2014-10-10 ENCOUNTER — Ambulatory Visit: Payer: BLUE CROSS/BLUE SHIELD | Admitting: Physical Therapy

## 2014-10-10 DIAGNOSIS — M179 Osteoarthritis of knee, unspecified: Secondary | ICD-10-CM | POA: Diagnosis not present

## 2014-10-10 DIAGNOSIS — M25561 Pain in right knee: Secondary | ICD-10-CM

## 2014-10-10 DIAGNOSIS — S8981XS Other specified injuries of right lower leg, sequela: Secondary | ICD-10-CM

## 2014-10-10 DIAGNOSIS — M1711 Unilateral primary osteoarthritis, right knee: Secondary | ICD-10-CM

## 2014-10-10 NOTE — Therapy (Signed)
[place], [place], [address] Phone: [phone]   Fax:  [phone]  Physical Therapy Treatment  Patient Details  Name: Lynn Fields MRN: [id number] Date of Birth: [birth year] Referring Provider:  Olga Millers, MD  Encounter Date: 10/10/2014      PT End of Session - 10/10/14 0855    Visit Number 12   Number of Visits 16   Date for PT Re-Evaluation 10/18/14   PT Start Time 0850   PT Stop Time 0930   PT Time Calculation (min) 40 min   Activity Tolerance Patient tolerated treatment well   Behavior During Therapy [facility] for tasks assessed/performed      Past Medical History  Diagnosis Date  . Diabetes mellitus without complication   . Hypertension   . Migraines   . Depression   . Ulcer     Past Surgical History  Procedure Laterality Date  . Cesarean section  1993  . Tubal ligation  1993    There were no vitals filed for this visit.  Visit Diagnosis:  Osteoarthritis of right knee, unspecified osteoarthritis type  Knee joint pain, right  Knee hyperextension injury, right, sequela      Subjective Assessment - 10/10/14 0851    Subjective Pt states pain is 2/10. She goes to see her doctor on monday. She got a job which she starts 10/26/14 which will involve walking around. She went for walks this weekend for a mile having to take breaks had increased pain after the first day of walking but by the 3rd day walks became easier. Soaking in epsom salt helped to alleviate pain.   Patient is accompained by: Family member   Currently in Pain? Yes   Pain Score 2    Pain Location Knee   Pain Orientation Right   Pain Descriptors / Indicators Sore   Pain Type Chronic pain   Pain Onset More than a month ago   Pain Frequency Constant   Aggravating Factors  walking   Pain Relieving Factors epsom salt bath   Multiple Pain Sites No            [facility] PT Assessment - 10/10/14 0859    Circumferential Edema   Circumferential - Right above, 25, mid 19, below 21            [facility] Adult PT Treatment/Exercise - 10/10/14 0903    Knee/Hip Exercises: Aerobic   Nustep 50mn, warm up, resistance 3, arms and legs    Knee/Hip Exercises: Standing   Gait Training curb step ups, weaving through signs on uneven ground (grass) without brace   Knee/Hip Exercises: Seated   Sit to Sand 15 reps;without UE support   Modalities   Modalities Ultrasound   Ultrasound   Ultrasound Location anterior knee   Ultrasound Parameters 1.5w/cm2, 876m, pulsed 50%,    Ultrasound Goals Pain                PT Education - 10/10/14 1022    Education provided Yes   Education Details how to navigate curbs and steps good vs bad leg   Person(s) Educated Patient   Methods Explanation;Demonstration   Comprehension Verbalized understanding;Returned demonstration          PT Short Term Goals - 10/03/14 0851    PT SHORT TERM GOAL #1   Title I with initial HEP   Time 1   Period Weeks   Status Achieved   PT SHORT TERM GOAL #2  Title report a decrease in pain at rest from 6/10 to 3/10   Baseline pain varies depending on how much she is up.   Time 4   Period Weeks   Status On-going   PT SHORT TERM GOAL #3   Title Demonstrate and verbalize understanding of condition management including RICE, positioning to prevent hyperextension at rest, and HEP   Baseline Understands and adhers with these   Time 4   Period Weeks   Status Achieved   PT SHORT TERM GOAL #4   Title Pt reports ability to participate in her standing ab workout video for 71mn without increased pain   Baseline has not tried due to pain   Time 4   Period Weeks   Status On-going   PT SHORT TERM GOAL #5   Title pt demonstrates and verbalizes approprite gait sequence up and down curbs/stairs to alleviate increases in knee pain   Baseline can do in clinic   Time 4   Period Weeks   Status On-going   PT SHORT TERM GOAL #6    Title Patient will demo increased strength in Hip/knee motions to 4+/5   Time 4   Period Weeks   Status Achieved           PT Long Term Goals - 10/03/14 09794   PT LONG TERM GOAL #1   Title Pt is able to tolerate elliptical/TM 129mutes without increased knee pain in order to workout again at home   Time 8   Period Weeks   Status Partially Met   PT LONG TERM GOAL #2   Title Pt reports ability to walk 2 hours [place]   Time 8   Period Weeks   Status On-going   PT LONG TERM GOAL #3   Title I with advanced HEP   Time 8   PT LONG TERM GOAL #4   Title PAIN will decreased to 3/10 with all functional activities in order to return to work   Time 8   PT LONG TERM GOAL #5   Title pt demo increased strength to 4+/5 R knee without reports of pain during MMT   Time 8   Status Achieved   PT LONG TERM GOAL #6   Title perform SLR with extension lag less than or equal to 10 degrees   Time 8   Period Weeks   Status On-going               Plan - 10/10/14 1025    Clinical Impression Statement Functional exercises/activities focused on today. Pt is eager to get better by the [place] She c/o now having increasing Lt. knee pain which is why she is going to see doctor on monday.  Edema assessed and no signs of swelling present and is getting better and under control, decreased by .5 below patella. Goals still in progress.   PT Next Visit Plan assess goals, functional exercises/activities, review how to navigate stairs (up with good/down with bad), Write MD note and route to Dr.   PTAbbott Paoome Exercise Plan no new ones given   Consulted and Agree with Plan of Care Patient        Problem List Patient Active Problem List   Diagnosis Date Noted  . Arthritis of right lower extremity 07/05/2014  . Right knee pain 04/17/2014  . Unspecified sleep apnea 06/02/2013  .  Vertigo 06/02/2013  .  Essential hypertension 06/14/2012  . Diabetes mellitus type 2, controlled, without complications 24/58/0998  . Obesity, morbid 06/14/2012   Radonna Ricker, SPT   PAA,JENNIFER 10/10/2014, 11:36 AM  [facility] [address] [place], [place], [address] Phone: [phone]   Fax:  [phone]   Raeford Razor, PT 10/10/2014 11:36 AM Phone: [phone] Fax: [phone]

## 2014-10-12 ENCOUNTER — Encounter: Payer: BLUE CROSS/BLUE SHIELD | Admitting: Physical Therapy

## 2014-10-12 ENCOUNTER — Ambulatory Visit: Payer: BLUE CROSS/BLUE SHIELD | Admitting: Physical Therapy

## 2014-10-12 DIAGNOSIS — M1711 Unilateral primary osteoarthritis, right knee: Secondary | ICD-10-CM

## 2014-10-12 DIAGNOSIS — M179 Osteoarthritis of knee, unspecified: Secondary | ICD-10-CM | POA: Diagnosis not present

## 2014-10-12 DIAGNOSIS — M25561 Pain in right knee: Secondary | ICD-10-CM

## 2014-10-12 DIAGNOSIS — S8981XS Other specified injuries of right lower leg, sequela: Secondary | ICD-10-CM

## 2014-10-12 NOTE — Therapy (Signed)
[place], [place], [address] Phone: [phone]   Fax:  [phone]  Physical Therapy Treatment  Patient Details  Name: Lynn Fields MRN: [id number] Date of Birth: 03-19-1970 Referring Provider:  Olga Millers, MD  Encounter Date: 10/12/2014      PT End of Session - 10/12/14 0929    Visit Number 13   Number of Visits 16   Date for PT Re-Evaluation 10/18/14   PT Start Time 0845   PT Stop Time 0925   PT Time Calculation (min) 40 min   Activity Tolerance Patient tolerated treatment well;No increased pain   Behavior During Therapy [facility] for tasks assessed/performed      Past Medical History  Diagnosis Date  . Diabetes mellitus without complication   . Hypertension   . Migraines   . Depression   . Ulcer     Past Surgical History  Procedure Laterality Date  . Cesarean section  1993  . Tubal ligation  1993    There were no vitals filed for this visit.  Visit Diagnosis:  Osteoarthritis of right knee, unspecified osteoarthritis type  Knee joint pain, right  Knee hyperextension injury, right, sequela      Subjective Assessment - 10/12/14 0849    Subjective Sore, no pain,  able to walk a mile 3 days and is walking alot most  days .   Currently in Pain? No/denies                         [facility] Adult PT Treatment/Exercise - 10/12/14 0850    Ambulation/Gait   Stairs --  16 steps, 1 rail step over step, good [employer],  6 inches.   Knee/Hip Exercises: Aerobic   Elliptical 3 minutes, ramp 1.  better with stool on/off for saftey.     Knee/Hip Exercises: Standing   Heel Raises 10 reps  RT only   Forward Lunges 10 reps  each, pole and against counter, cues   Functional Squat --  around mat, cues.   Rocker Board Limitations BOSU ball standing 2 feet challanged.,  [place] RT 4 in a row best.     SLS --  10 seconds 1 rep                  PT Short Term Goals -  10/12/14 3704    PT SHORT TERM GOAL #2   Title report a decrease in pain at rest from 6/10 to 3/10   Baseline no pain soreness   Time 4   Period Weeks   Status Achieved   PT SHORT TERM GOAL #3   Title Demonstrate and verbalize understanding of condition management including RICE, positioning to prevent hyperextension at rest, and HEP   Time 4   Period Weeks   Status Achieved   PT SHORT TERM GOAL #4   Title Pt reports ability to participate in her standing ab workout video for 72mn without increased pain   Baseline deferred for now,  Bike, walking, treadmill.     Time 4   Period Weeks   Status Deferred   PT SHORT TERM GOAL #5   Title pt demonstrates and verbalizes approprite gait sequence up and down curbs/stairs to alleviate increases in knee pain   Baseline can do   Time 4   Period Weeks   Status Achieved   PT SHORT TERM GOAL #6   Title Patient will demo increased strength in Hip/knee  motions to 4+/5   Baseline Knee flexion 4/5, quads 4+/4  Wearing brace.           PT Long Term Goals - 10/12/14 0858    PT LONG TERM GOAL #1   Title Pt is able to tolerate elliptical/TM 72mnutes without increased knee pain in order to workout again at home   Baseline treadmillat home 15  minutes   Time 8   Period Weeks   Status Achieved   PT LONG TERM GOAL #2   Title Pt reports ability to walk 2 hours [place]   Baseline 2 hours yesterday.   Time 8   Period Weeks   Status Achieved   PT LONG TERM GOAL #3   Title I with advanced HEP   Baseline independent so far   Time 8   Period Weeks   Status On-going   PT LONG TERM GOAL #4   Title PAIN will decreased to 3/10 with all functional activities in order to return to work   Baseline 5.5 to 6/10   Time 8   Period Weeks   Status On-going   PT LONG TERM GOAL #5   Title pt demo increased strength to 4+/5 R knee without reports of pain during MMT   Baseline 4/5   Time 8    Period Weeks   Status On-going   PT LONG TERM GOAL #6   Title perform SLR with extension lag less than or equal to 10 degrees   Baseline no lag with brace on, with brace off , no lag   Time 8   Period Weeks   Status Achieved               Plan - 10/12/14 0930    Clinical Impression Statement Less back pain post session.  No knee pain today.  multiple goals met.  still needs knee and hip strengthening.  Patient walking a mile frequently.   PT Next Visit Plan Strengthening, Try cable cross walking, lunges   Consulted and Agree with Plan of Care Patient;Family member/caregiver   Family Member Consulted husband        Problem List Patient Active Problem List   Diagnosis Date Noted  . Arthritis of right lower extremity 07/05/2014  . Right knee pain 04/17/2014  . Unspecified sleep apnea 06/02/2013  . Vertigo 06/02/2013  . Essential hypertension 06/14/2012  . Diabetes mellitus type 2, controlled, without complications 095/58/3167 . Obesity, morbid 06/14/2012    [facility]2016, 9:33 AM  [facility]189[address]GHol[place] [address]Phone: [phone]  Fax:  [phone]    KMelvenia Needles PTA 10/12/2014 9:33 AM Phone: [phone]: [phone]

## 2014-10-16 ENCOUNTER — Ambulatory Visit: Payer: BLUE CROSS/BLUE SHIELD | Admitting: Family Medicine

## 2014-10-17 ENCOUNTER — Encounter: Payer: BLUE CROSS/BLUE SHIELD | Admitting: Physical Therapy

## 2014-10-17 ENCOUNTER — Ambulatory Visit: Payer: BLUE CROSS/BLUE SHIELD | Admitting: Physical Therapy

## 2014-10-17 DIAGNOSIS — M1711 Unilateral primary osteoarthritis, right knee: Secondary | ICD-10-CM

## 2014-10-17 DIAGNOSIS — S8981XS Other specified injuries of right lower leg, sequela: Secondary | ICD-10-CM

## 2014-10-17 DIAGNOSIS — M25561 Pain in right knee: Secondary | ICD-10-CM

## 2014-10-17 DIAGNOSIS — M179 Osteoarthritis of knee, unspecified: Secondary | ICD-10-CM | POA: Diagnosis not present

## 2014-10-17 NOTE — Therapy (Signed)
[place], [place], [address] Phone: [phone]   Fax:  [phone]  Physical Therapy Treatment  Patient Details  Name: Tamber Burtch MRN: [id number] Date of Birth: 16-Apr-1970 Referring Provider:  Olga Millers, MD  Encounter Date: 10/17/2014      PT End of Session - 10/17/14 1159    Visit Number 14   Number of Visits 16   Date for PT Re-Evaluation 10/18/14   PT Start Time 0845   PT Stop Time 0925   PT Time Calculation (min) 40 min   Activity Tolerance Patient tolerated treatment well   Behavior During Therapy [facility] for tasks assessed/performed      Past Medical History  Diagnosis Date  . Diabetes mellitus without complication   . Hypertension   . Migraines   . Depression   . Ulcer     Past Surgical History  Procedure Laterality Date  . Cesarean section  1993  . Tubal ligation  1993    There were no vitals filed for this visit.  Visit Diagnosis:  Osteoarthritis of right knee, unspecified osteoarthritis type  Knee joint pain, right  Knee hyperextension injury, right, sequela      Subjective Assessment - 10/17/14 0852    Subjective Pt states soreness from walk when her leg slipped on saturday when a dog was running towards her family. Rates it a 4/10.   Currently in Pain? Yes   Pain Score 4    Pain Location Knee  just sore   Pain Orientation Right   Pain Descriptors / Indicators Sore   Pain Type Chronic pain   Pain Onset More than a month ago   Pain Frequency Intermittent            [facility] PT Assessment - 10/17/14 0907    Circumferential Edema   Circumferential - Right superior patella25, mid patella 21, inferior patella 22.5   AROM   Right/Left Hip --  55 right SLR   Right Knee Extension -7   Right Knee Flexion 120  with pain at 120, no pain at 100   PROM   Right Knee Flexion 125   Strength   Right Hip Flexion 4+/5   Right Hip Extension 5/5   Right Hip  ABduction 5/5  some pain with resistance   Right Hip ADduction 5/5   Left Hip Flexion 5/5   Left Hip Extension 5/5   Left Hip ABduction 5/5   Left Hip ADduction 5/5   Right Knee Flexion 4+/5   Right Knee Extension 4+/5   Left Knee Flexion 5/5   Left Knee Extension 5/5   Right Ankle Dorsiflexion 5/5   Right Ankle Plantar Flexion 5/5   Left Ankle Dorsiflexion 5/5   Left Ankle Plantar Flexion 5/5   Patellofemoral Grind test (Clark's Sign)   Findings Negative   Side  Right             [facility] Adult PT Treatment/Exercise - 10/17/14 0858    Knee/Hip Exercises: Aerobic   Tread Mill 80mn walking warmup, side stepping level 0.4 332m/each side. backwards walking 0.4 for 83m67m  Knee/Hip Exercises: Standing   Hip Abduction Stengthening;Both;1 set;10 reps;Knee straight   Abduction Limitations red band   Hip Extension Stengthening;Both;1 set;10 reps;Knee straight   Extension Limitations red band   Lateral Step Up Right;1 set;10 reps;Hand Hold: 1;Step Height: 4"   Forward Step Up Right;1 set;10 reps;Hand Hold: 1;Step Height: 4"   Step Down Right;1 set;10  reps;Hand Hold: 1;Step Height: 4"           PT Education - 10/17/14 1159    Education provided Yes   Education Details Finalized HEP   Person(s) Educated Patient   Methods Explanation;Demonstration;Handout   Comprehension Verbalized understanding;Returned demonstration          PT Short Term Goals - 10/17/14 1159    PT SHORT TERM GOAL #1   Title I with initial HEP   Time 1   Status Achieved   PT SHORT TERM GOAL #2   Title report a decrease in pain at rest from 6/10 to 3/10   Status Achieved   PT SHORT TERM GOAL #3   Title Demonstrate and verbalize understanding of condition management including RICE, positioning to prevent hyperextension at rest, and HEP   Status Achieved   PT SHORT TERM GOAL #4   Title Pt reports ability to participate in her standing ab workout video for 42mn without increased pain   Baseline Still  had not tried by discharge, she wanted to "take it easy" while in therapy   Time 4   Period Weeks   Status Deferred   PT SHORT TERM GOAL #5   Title pt demonstrates and verbalizes approprite gait sequence up and down curbs/stairs to alleviate increases in knee pain   Status Achieved   PT SHORT TERM GOAL #6   Title Patient will demo increased strength in Hip/knee motions to 4+/5   Status Achieved           PT Long Term Goals - 10/17/14 1208    PT LONG TERM GOAL #1   Title Pt is able to tolerate elliptical/TM 157mutes without increased knee pain in order to workout again at home   Time 8   Period Weeks   Status Achieved   PT LONG TERM GOAL #2   Title Pt reports ability to walk 2 hours in order to go [place] her walks there with family   Baseline 2 hours yesterday.   Time 8   Period Weeks   Status Achieved   PT LONG TERM GOAL #3   Title I with advanced HEP   Time 8   Period Weeks   Status Achieved   PT LONG TERM GOAL #4   Title PAIN will decreased to 3/10 with all functional activities in order to return to work   Baseline 5-6/10   Time 8   Period Weeks   Status Not Met   PT LONG TERM GOAL #5   Title pt demo increased strength to 4+/5 R knee without reports of pain during MMT   Time 8   Period Weeks   Status Achieved   PT LONG TERM GOAL #6   Title perform SLR with extension lag less than or equal to 10 degrees   Baseline no lag with brace on, with brace off , no lag   Time 8   Period Weeks   Status Achieved               Plan - 10/17/14 1241    Clinical Impression Statement Pt presented to clinic with soreness in knee from walk this weekend. She is satisfied with her progress and ready to be discharged. She met all STG's, goal #4 was defferred due to the patient not being ready to attempt it at home and she wanted to take it easy in therapy.All LTG's were met except # 4 for pain management with activity, this  could be due to the arthritis  present in her knees.    PT Next Visit Plan discharged   Consulted and Agree with Plan of Care Patient        Problem List Patient Active Problem List   Diagnosis Date Noted  . Arthritis of right lower extremity 07/05/2014  . Right knee pain 04/17/2014  . Unspecified sleep apnea 06/02/2013  . Vertigo 06/02/2013  . Essential hypertension 06/14/2012  . Diabetes mellitus type 2, controlled, without complications 85/27/7824  . Obesity, morbid 06/14/2012   PHYSICAL THERAPY DISCHARGE SUMMARY  Visits from Start of Care: 14 Current functional level related to goals / functional outcomes: Independent. She has returned to exercising, including walks up to two hours at a time.    Remaining deficits: Recurring swelling with increased walking distance, pain with physical activity, and knee hyperextension bilaterally (this has been a lifelong postural deficit) Education / Equipment: Finalized HEP with patient Plan: Patient agrees to discharge.  Patient goals were partially met. Patient is being discharged due to being pleased with the current functional level.  ?????  LTG # 4 was the only Goal NOT MET Pt felt that she is pretty much "where she will be".    Raeford Razor, PT 10/17/2014 1:12 PM Phone: [phone] Fax: [phone]      Radonna Ricker, SPT  PAA,JENNIFER 10/17/2014, 1:11 PM  [facility] [address] [place], [place], [address] Phone: [phone]   Fax:  [phone]

## 2014-10-19 ENCOUNTER — Encounter: Payer: BLUE CROSS/BLUE SHIELD | Admitting: Physical Therapy

## 2014-10-19 ENCOUNTER — Ambulatory Visit: Payer: BLUE CROSS/BLUE SHIELD | Admitting: Physical Therapy

## 2014-10-24 ENCOUNTER — Encounter: Payer: BLUE CROSS/BLUE SHIELD | Admitting: Physical Therapy

## 2014-10-24 ENCOUNTER — Other Ambulatory Visit (INDEPENDENT_AMBULATORY_CARE_PROVIDER_SITE_OTHER): Payer: BLUE CROSS/BLUE SHIELD

## 2014-10-24 ENCOUNTER — Ambulatory Visit (INDEPENDENT_AMBULATORY_CARE_PROVIDER_SITE_OTHER): Payer: BLUE CROSS/BLUE SHIELD | Admitting: Family Medicine

## 2014-10-24 ENCOUNTER — Encounter: Payer: Self-pay | Admitting: *Deleted

## 2014-10-24 ENCOUNTER — Encounter: Payer: Self-pay | Admitting: Family Medicine

## 2014-10-24 VITALS — BP 130/86 | HR 79 | Ht 60.0 in | Wt 306.0 lb

## 2014-10-24 DIAGNOSIS — M25562 Pain in left knee: Secondary | ICD-10-CM

## 2014-10-24 DIAGNOSIS — M129 Arthropathy, unspecified: Secondary | ICD-10-CM

## 2014-10-24 DIAGNOSIS — IMO0002 Reserved for concepts with insufficient information to code with codable children: Secondary | ICD-10-CM

## 2014-10-24 NOTE — Assessment & Plan Note (Signed)
Patient given injection today and tolerated the procedure well. We discussed icing regimen and home exercises. We discussed which activities to do an which ones to potentially avoid. I do not think that patient would be able to wear bilateral braces. Patient is going to be starting a job soon. Patient given a note so she can wear the medial unloader brace on the right knee. Patient will continue with conservative therapy. Patient come back again in 3 weeks. If having pain she would be a candidate for viscous supplementation in this knee as well.

## 2014-10-24 NOTE — Progress Notes (Signed)
[facility] [place], [place] [address] Phone: [phone] Subjective:     CC: Right knee pain follow-up  KGU:[id number] [facility] is a 44 y.o. female coming in with complaint of right knee pain. Patient was found to have moderate arthritis in the knee. Patient has done all conservative therapy including custom brace, Orthovisc injections, steroid injection, formal physical therapy, icing and home exercises. Patient states right knee is feeling a proximally 60% better. As long she wears the brace she can do all activities of daily living without any pain. Patient though is having left knee pain. Very similar to the other time with her contralateral knee. States that it seems to hurt more on the medial aspect. X-rays have shown the patient has moderate arthritis of this knee as well. Patient is trying to continue to be active. Patient is starting a job soon and is concerned because she does not want her pain to affect her work.      Past Medical History  Diagnosis Date  . Diabetes mellitus without complication   . Hypertension   . Migraines   . Depression   . Ulcer    Past Surgical History  Procedure Laterality Date  . Cesarean section  1993  . Tubal ligation  1993   Social History  Substance Use Topics  . Smoking status: Never Smoker   . Smokeless tobacco: Never Used  . Alcohol Use: No   Allergies  Allergen Reactions  . Codeine Itching  . Morphine And Related Itching  . Penicillins Swelling  . Sudafed [Pseudoephedrine Hcl] Swelling  . Azithromycin Rash  . Erythromycin Rash   Filed Weights   10/24/14 0854  Weight: 306 lb (138.801 kg)     Past medical history, social, surgical and family history all reviewed in electronic medical record.   Review of Systems: No headache, visual changes, nausea, vomiting, diarrhea, constipation, dizziness, abdominal pain, skin rash, fevers, chills, night sweats, weight loss, swollen lymph  nodes, body aches, joint swelling, muscle aches, chest pain, shortness of breath, mood changes.   Objective Blood pressure 130/86, pulse 79, height 5' (1.524 m), weight 306 lb (138.801 kg), SpO2 97 %.  General: No apparent distress alert and oriented x3 mood and affect normal, dressed appropriately.  HEENT: Pupils equal, extraocular movements intact  Respiratory: Patient's speak in full sentences and does not appear short of breath  Cardiovascular: No lower extremity edema, non tender, no erythema  Skin: Warm dry intact with no signs of infection or rash on extremities or on axial skeleton.  Abdomen: Soft nontender  Neuro: Cranial nerves II through XII are intact, neurovascularly intact in all extremities with 2+ DTRs and 2+ pulses.  Lymph: No lymphadenopathy of posterior or anterior cervical chain or axillae bilaterally.  Gait normal with good balance and coordination.  MSK:  Non tender with full range of motion and good stability and symmetric strength and tone of shoulders, elbows, wrist, hip, and ankles bilaterally.  Knee: Left Difficult to assess secondary to patient's body habitus Tender to palpation over the medial joint line ROM full in flexion and extension and lower leg rotation. Ligaments with solid consistent endpoints including ACL, PCL, LCL, MCL. Negative Mcmurray's, Apley's, and Thessalonian tests. mild painful patellar compression. Patellar glide with mild crepitus. Patellar and quadriceps tendons unremarkable. Hamstring and quadriceps strength is normal.  Contralateral knee is significantly less tender than it was previously.  Procedure: Real-time Ultrasound Guided Injection of left knee Device: GE Logiq  E  Ultrasound guided injection is preferred based studies that show increased duration, increased effect, greater accuracy, decreased procedural pain, increased response rate, and decreased cost with ultrasound guided versus blind injection.  Verbal informed consent  obtained.  Time-out conducted.  Noted no overlying erythema, induration, or other signs of local infection.  Skin prepped in a sterile fashion.  Local anesthesia: Topical Ethyl chloride.  With sterile technique and under real time ultrasound guidance: With a 22-gauge 2 inch needle patient was injected with 4 cc of 0.5% Marcaine and 1 cc of Kenalog 40 mg/dL. This was from a superior lateral approach.  Completed without difficulty  Pain immediately resolved suggesting accurate placement of the medication.  Advised to call if fevers/chills, erythema, induration, drainage, or persistent bleeding.  Images permanently stored and available for review in the ultrasound unit.  Impression: Technically successful ultrasound guided injection.      Impression and Recommendations:     This case required medical decision making of moderate complexity.

## 2014-10-24 NOTE — Progress Notes (Signed)
Pre visit review using our clinic review tool, if applicable. No additional management support is needed unless otherwise documented below in the visit note. 

## 2014-10-24 NOTE — Patient Instructions (Signed)
Good luck with the job If doing something on one knee do it to the other Ice is your friend See me again in 3 weeks.

## 2014-10-26 ENCOUNTER — Encounter: Payer: BLUE CROSS/BLUE SHIELD | Admitting: Physical Therapy

## 2014-11-14 ENCOUNTER — Encounter: Payer: Self-pay | Admitting: Family Medicine

## 2014-11-14 ENCOUNTER — Ambulatory Visit (INDEPENDENT_AMBULATORY_CARE_PROVIDER_SITE_OTHER): Payer: BLUE CROSS/BLUE SHIELD | Admitting: Family Medicine

## 2014-11-14 VITALS — BP 126/82 | HR 78

## 2014-11-14 DIAGNOSIS — M129 Arthropathy, unspecified: Secondary | ICD-10-CM | POA: Diagnosis not present

## 2014-11-14 DIAGNOSIS — IMO0002 Reserved for concepts with insufficient information to code with codable children: Secondary | ICD-10-CM

## 2014-11-14 NOTE — Progress Notes (Signed)
[facility] [place], [place] [address] Phone: [phone] Subjective:     CC: Right knee pain follow-up  DUK:GURKYHCWCB [facility] is a 44 y.o. female coming in with complaint of left knee pain. 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 Patient did complete an Orthoviscon the contralateral side and is wondering if she can have the same thing. Patient did have steroid injection 3 weeks ago on the left side with minimal improvement. Continuing to have discomfort. Sometimes feels unstable. Failed all other conservative therapy.      Past Medical History  Diagnosis Date  . Diabetes mellitus without complication   . Hypertension   . Migraines   . Depression   . Ulcer    Past Surgical History  Procedure Laterality Date  . Cesarean section  1993  . Tubal ligation  1993   Social History  Substance Use Topics  . Smoking status: Never Smoker   . Smokeless tobacco: Never Used  . Alcohol Use: No   Allergies  Allergen Reactions  . Codeine Itching  . Morphine And Related Itching  . Penicillins Swelling  . Sudafed [Pseudoephedrine Hcl] Swelling  . Azithromycin Rash  . Erythromycin Rash   There were no vitals filed for this visit.   Past medical history, social, surgical and family history all reviewed in electronic medical record.   Review of Systems: No headache, visual changes, nausea, vomiting, diarrhea, constipation, dizziness, abdominal pain, skin rash, fevers, chills, night sweats, weight loss, swollen lymph nodes, body aches, joint swelling, muscle aches, chest pain, shortness of breath, mood changes.   Objective Blood pressure 126/82, pulse 78.  General: No apparent distress alert and oriented x3 mood and affect normal, dressed appropriately.  HEENT: Pupils equal, extraocular movements intact  Respiratory: Patient's speak in full sentences and does not appear short of breath  Cardiovascular: No  lower extremity edema, non tender, no erythema  Skin: Warm dry intact with no signs of infection or rash on extremities or on axial skeleton.  Abdomen: Soft nontender  Neuro: Cranial nerves II through XII are intact, neurovascularly intact in all extremities with 2+ DTRs and 2+ pulses.  Lymph: No lymphadenopathy of posterior or anterior cervical chain or axillae bilaterally.  Gait normal with good balance and coordination.  MSK:  Non tender with full range of motion and good stability and symmetric strength and tone of shoulders, elbows, wrist, hip, and ankles bilaterally.  Knee: Left Difficult to assess secondary to patient's body habitus Tender to palpation over the medial joint line ROM full in flexion and extension and lower leg rotation. Ligaments with solid consistent endpoints including ACL, PCL, LCL, MCL. Negative Mcmurray's, Apley's, and Thessalonian tests. mild painful patellar compression. Patellar glide with mild crepitus. Patellar and quadriceps tendons unremarkable. Hamstring and quadriceps strength is normal.  Contralateral knee is significantly less tender than it was previously. No significant change from previous exam except for the right knee.  Procedure: Real-time Ultrasound Guided Injection of left knee Device: GE Logiq E  Ultrasound guided injection is preferred based studies that show increased duration, increased effect, greater accuracy, decreased procedural pain, increased response rate, and decreased cost with ultrasound guided versus blind injection.  Verbal informed consent obtained.  Time-out conducted.  Noted no overlying erythema, induration, or other signs of local infection.  Skin prepped in a sterile fashion.  Local anesthesia: Topical Ethyl chloride.  With sterile technique and under real time ultrasound guidance: With a 22-gauge  2 inch needle patient was injected with16 mg/2.5 mL of Synvisc (sodium hyaluronate) in a prefilled syringe was injected  easily into the knee through a 22-gauge needle. This was from a superior lateral approach.  Completed without difficulty  Pain immediately resolved suggesting accurate placement of the medication.  Advised to call if fevers/chills, erythema, induration, drainage, or persistent bleeding.  Images permanently stored and available for review in the ultrasound unit.  Impression: Technically successful ultrasound guided injection.      Impression and Recommendations:     This case required medical decision making of moderate complexity.

## 2014-11-14 NOTE — Assessment & Plan Note (Signed)
Patient started viscous supplementation again today. We discussed icing regimen and home exercises. We discussed which activities to potentially do an which was to avoid. Patient will hold on a custom brace secondary to her wearing one on the contralateral side and likely would be uncomfortable. Patient come back next week for second in a series of 3 injections.

## 2014-11-14 NOTE — Patient Instructions (Addendum)
You know the drill Ice is your friend Synvisc next 2 weeks See you then!

## 2014-11-15 ENCOUNTER — Encounter: Payer: Self-pay | Admitting: Family Medicine

## 2014-11-15 ENCOUNTER — Encounter (HOSPITAL_COMMUNITY): Payer: Self-pay | Admitting: Emergency Medicine

## 2014-11-15 ENCOUNTER — Emergency Department (HOSPITAL_COMMUNITY)
Admission: EM | Admit: 2014-11-15 | Discharge: 2014-11-15 | Disposition: A | Discharge status: Home / Self Care | Payer: BLUE CROSS/BLUE SHIELD | Attending: Emergency Medicine | Admitting: Emergency Medicine

## 2014-11-15 ENCOUNTER — Telehealth: Payer: Self-pay

## 2014-11-15 DIAGNOSIS — Z880 Allergy status to penicillin: Secondary | ICD-10-CM | POA: Insufficient documentation

## 2014-11-15 DIAGNOSIS — F329 Major depressive disorder, single episode, unspecified: Secondary | ICD-10-CM | POA: Insufficient documentation

## 2014-11-15 DIAGNOSIS — I10 Essential (primary) hypertension: Secondary | ICD-10-CM | POA: Insufficient documentation

## 2014-11-15 DIAGNOSIS — Z794 Long term (current) use of insulin: Secondary | ICD-10-CM | POA: Insufficient documentation

## 2014-11-15 DIAGNOSIS — E119 Type 2 diabetes mellitus without complications: Secondary | ICD-10-CM | POA: Insufficient documentation

## 2014-11-15 DIAGNOSIS — M25562 Pain in left knee: Secondary | ICD-10-CM | POA: Insufficient documentation

## 2014-11-15 DIAGNOSIS — Z79899 Other long term (current) drug therapy: Secondary | ICD-10-CM | POA: Insufficient documentation

## 2014-11-15 DIAGNOSIS — Z872 Personal history of diseases of the skin and subcutaneous tissue: Secondary | ICD-10-CM | POA: Insufficient documentation

## 2014-11-15 DIAGNOSIS — G43909 Migraine, unspecified, not intractable, without status migrainosus: Secondary | ICD-10-CM | POA: Insufficient documentation

## 2014-11-15 DIAGNOSIS — Z791 Long term (current) use of non-steroidal anti-inflammatories (NSAID): Secondary | ICD-10-CM | POA: Insufficient documentation

## 2014-11-15 DIAGNOSIS — M170 Bilateral primary osteoarthritis of knee: Secondary | ICD-10-CM

## 2014-11-15 MED ORDER — NAPROXEN 500 MG PO TABS
500.0000 mg | ORAL_TABLET | Freq: Once | ORAL | Status: AC
Start: 2014-11-15 — End: 2014-11-15
  Administered 2014-11-15: 500 mg via ORAL
  Filled 2014-11-15: qty 1

## 2014-11-15 MED ORDER — IBUPROFEN 800 MG PO TABS: 800.0000 mg | ORAL_TABLET | Freq: Three times a day (TID) | ORAL | Status: DC

## 2014-11-15 NOTE — Discharge Instructions (Signed)
Take your prescriptions as prescribed. Continue using ice and elevating her knee for pain relief. Follow-up with your primary care provider as soon as possible. Return to the emergency department if symptoms worsen or new onset of fever, chills, swelling, redness, warmth, numbness, tingling, weakness.

## 2014-11-15 NOTE — Telephone Encounter (Signed)
Spoke with patient regarding visit to ED. Told her to see how she is the next 24hours and if still in a great deal of pain to call us back tomorrow.

## 2014-11-15 NOTE — ED Provider Notes (Signed)
CSN: [id number]     Arrival date & time 11/15/14  0734 History   First MD Initiated Contact with Patient 11/15/14 [phone]     Chief Complaint  Patient presents with  . Knee Pain    left     (Consider location/radiation/quality/duration/timing/severity/associated sxs/prior Treatment) HPI Comments: Patient is a 44 year old female with past medical history of osteoarthritis who presents to the ED with complaint of left knee pain. Patient reports she was seen by her primary care provider yesterday and was given a Synvisc injection to her left knee. She notes around 10 PM last night she started to have left knee pain. Denies any recent fall, trauma, injury. Denies fever, chills, swelling, redness, numbness, tingling. She reports she has had difficulty walking and her and left knee feels weak due to pain. Patient has been taking 800 mg ibuprofen and Flexeril without relief. She notes she has been using ice and elevating her knee. Denies any prior injury or surgeries to the left knee. Patient reports she had a steroid injection to the left knee a few weeks ago without any complications.   Past Medical History  Diagnosis Date  . Diabetes mellitus without complication ([place])   . Hypertension   . Migraines   . Depression   . Ulcer    Past Surgical History  Procedure Laterality Date  . Cesarean section  1993  . Tubal ligation  1993   Family History  Problem Relation Age of Onset  . Diabetes Mother   . Hypertension Mother   . Heart disease Mother   . Hyperlipidemia Mother   . Diabetes Father   . Hypertension Father   . Heart disease Father   . Hyperlipidemia Father   . Cervical cancer Sister    Social History  Substance Use Topics  . Smoking status: Never Smoker   . Smokeless tobacco: Never Used  . Alcohol Use: No   OB History    No data available     Review of Systems  Constitutional: Negative for fever and chills.  Cardiovascular: Negative for leg swelling.  Musculoskeletal:  Positive for arthralgias. Negative for myalgias, back pain and joint swelling.  Skin: Negative for wound.  Neurological: Positive for weakness (due to pain). Negative for numbness.      Allergies  Codeine; Morphine and related; Penicillins; Sudafed; Azithromycin; and Erythromycin  Home Medications   Prior to Admission medications   Medication Sig Start Date End Date Taking? Authorizing Provider  acetaminophen (TYLENOL) 500 MG tablet Take 1,000 mg by mouth every 6 (six) hours as needed for moderate pain.    Historical Provider, MD  citalopram (CELEXA) 10 MG tablet Take 1 tablet (10 mg total) by mouth daily. 09/20/14   Hoyt Koch, MD  cyclobenzaprine (FLEXERIL) 10 MG tablet Take 1 tablet (10 mg total) by mouth 3 (three) times daily as needed for muscle spasms. 06/20/14   Hoyt Koch, MD  Diclofenac Sodium 2 % SOLN Apply 1 pump twice daily. 07/05/14   Lyndal Pulley, DO  ibuprofen (ADVIL,MOTRIN) 800 MG tablet Take 1 tablet (800 mg total) by mouth 3 (three) times daily. 02/22/14   Domenic Moras, PA-C  lisinopril-hydrochlorothiazide (PRINZIDE,ZESTORETIC) 10-12.5 MG per tablet Take 1 tablet by mouth daily before breakfast. 09/20/14   Hoyt Koch, MD  meclizine (ANTIVERT) 25 MG tablet Take 1 tablet (25 mg total) by mouth 3 (three) times daily as needed for dizziness. 06/02/13   Janith Lima, MD  metFORMIN (GLUCOPHAGE) 500 MG tablet Take  1 tablet (500 mg total) by mouth daily with breakfast. 09/20/14   Hoyt Koch, MD   BP 136/73 mmHg  Pulse 89  Temp(Src) 98.4 F (36.9 C) (Oral)  Resp 20  SpO2 100%  LMP 10/30/2014 (Approximate) Physical Exam  Constitutional: She is oriented to person, place, and time. She appears well-developed and well-nourished.  Morbidly obese female  HENT:  Head: Normocephalic and atraumatic.  Eyes: Conjunctivae and EOM are normal. Right eye exhibits no discharge. Left eye exhibits no discharge. No scleral icterus.  Neck: Normal range of  motion. Neck supple.  Cardiovascular: Normal rate and intact distal pulses.   Pulmonary/Chest: Effort normal. No respiratory distress.  Abdominal: She exhibits no distension.  Musculoskeletal: Normal range of motion. She exhibits tenderness. She exhibits no edema.       Left knee: She exhibits normal range of motion, no swelling, no effusion, no ecchymosis, no deformity, no laceration, no erythema, normal alignment, no LCL laxity, normal patellar mobility, no bony tenderness, normal meniscus and no MCL laxity. Tenderness found. Medial joint line and lateral joint line tenderness noted.  Neurological: She is alert and oriented to person, place, and time. She has normal strength and normal reflexes. No sensory deficit. Gait normal.  Skin: Skin is warm and dry. No rash noted. No erythema.  Nursing note and vitals reviewed.   ED Course  Procedures (including critical care time) Labs Review Labs Reviewed - No data to display  Imaging Review No results found. I have personally reviewed and evaluated these images and lab results as part of my medical decision-making.  Filed Vitals:   11/15/14 0739  BP: 136/73  Pulse: 89  Temp: 98.4 F (36.9 C)  Resp: 20     MDM   Final diagnoses:  Left knee pain  Osteoarthritis of both knees, unspecified osteoarthritis type   Patient presents with left knee pain. History of osteoarthritis of bilateral knees. Pt was seen by her PCP yesterday afternoon and given a Synvisc infection to her left knee. No recent falls, trauma, injury. No hx of injury or surgery to left knee. VSS. Exam reveals morbidly obese female with left knee TTP at medial and lateral joint line, FROM. No swelling, no deformity, no signs of infection. Left leg neurovascularly intact. Pt able to stand and ambulate without assistance. I do not feel that any further workup or imaging is warranted at this time. There are no signs of infection, effusion, deformity. Pt given pain meds, knee  immobilizer and advised to f/u with her PCP. Plan for d/c. Advised pt to continue using ice and ibuprofen for pain relief.   Evaluation does not show pathology requring ongoing emergent intervention or admission. Pt is hemodynamically stable and mentating appropriately. Discussed findings/results and plan with patient/guardian, who agrees with plan. All questions answered. Return precautions discussed and outpatient follow up given.      Nona Dell, PA-C 11/15/14 [place], DO 11/15/14 4128

## 2014-11-15 NOTE — ED Notes (Signed)
Patient states she had an injection of Marcaine and Kenalog  to her knee yesterday around 1600. Patient states when the feeling came back she began having severe pain. Patient took  800mg  Ibuprofen @ 2200, flexeril @2300  without relief. Patient states she also used ice and elevated her knee. Patient reports having the same procedure done to her right knee in the past but didn't have these complications. Patient states it hurts to walk on her left leg.

## 2014-11-21 ENCOUNTER — Ambulatory Visit: Payer: BLUE CROSS/BLUE SHIELD | Admitting: Family Medicine

## 2014-11-27 ENCOUNTER — Encounter: Payer: Self-pay | Admitting: Internal Medicine

## 2014-11-27 ENCOUNTER — Encounter: Payer: Self-pay | Admitting: *Deleted

## 2014-11-27 ENCOUNTER — Encounter: Payer: Self-pay | Admitting: Family Medicine

## 2014-11-28 ENCOUNTER — Ambulatory Visit: Payer: BLUE CROSS/BLUE SHIELD | Admitting: Family Medicine

## 2015-03-23 ENCOUNTER — Other Ambulatory Visit (INDEPENDENT_AMBULATORY_CARE_PROVIDER_SITE_OTHER): Payer: BLUE CROSS/BLUE SHIELD

## 2015-03-23 ENCOUNTER — Encounter: Payer: Self-pay | Admitting: Internal Medicine

## 2015-03-23 ENCOUNTER — Ambulatory Visit (INDEPENDENT_AMBULATORY_CARE_PROVIDER_SITE_OTHER): Payer: BLUE CROSS/BLUE SHIELD | Admitting: Internal Medicine

## 2015-03-23 VITALS — BP 112/82 | HR 100 | Temp 98.3°F | Resp 16 | Ht 60.0 in | Wt 295.0 lb

## 2015-03-23 DIAGNOSIS — E119 Type 2 diabetes mellitus without complications: Secondary | ICD-10-CM

## 2015-03-23 DIAGNOSIS — R05 Cough: Secondary | ICD-10-CM

## 2015-03-23 DIAGNOSIS — R059 Cough, unspecified: Secondary | ICD-10-CM

## 2015-03-23 LAB — HEMOGLOBIN A1C: HEMOGLOBIN A1C: 8 % — AB (ref 4.6–6.5)

## 2015-03-23 LAB — LIPID PANEL
CHOL/HDL RATIO: 5
Cholesterol: 229 mg/dL — ABNORMAL HIGH (ref 0–200)
HDL: 50.1 mg/dL (ref >39.00)
LDL CALC: 151 mg/dL — AB (ref 0–99)
NONHDL: 178.8
Triglycerides: 139 mg/dL (ref 0.0–149.0)
VLDL: 27.8 mg/dL (ref 0.0–40.0)

## 2015-03-23 MED ORDER — LOSARTAN POTASSIUM-HCTZ 50-12.5 MG PO TABS: 1.0000 | ORAL_TABLET | Freq: Every day | ORAL | Status: DC

## 2015-03-23 MED ORDER — BENZONATATE 200 MG PO CAPS: 200.0000 mg | ORAL_CAPSULE | Freq: Three times a day (TID) | ORAL | Status: DC | PRN

## 2015-03-23 NOTE — Progress Notes (Signed)
Pre visit review using our clinic review tool, if applicable. No additional management support is needed unless otherwise documented below in the visit note. 

## 2015-03-23 NOTE — Progress Notes (Signed)
   Subjective:    Patient ID: Lynn Fields, female    DOB: 05/19/1970, 45 y.o.   MRN: [id number]  HPI The patient is a 45 YO female coming in for cough for several months. It is mostly a dry cough and started after a cold in November. She denies fevers or chills. No sinus drainage or congestion. Denies GERD symptoms. Otherwise doing well, her husband is concerned about the cough. She is intentionally losing weight and is down 10 pounds since last visit.    Review of Systems  Constitutional: Positive for activity change. Negative for fever, appetite change and fatigue.       Exercising more  HENT: Negative.   Eyes: Negative.   Respiratory: Positive for cough. Negative for chest tightness, shortness of breath and wheezing.   Cardiovascular: Negative for chest pain, palpitations and leg swelling.  Gastrointestinal: Negative for abdominal pain, diarrhea, constipation and abdominal distention.  Musculoskeletal: Positive for arthralgias. Negative for gait problem.  Neurological: Negative for weakness, light-headedness and headaches.      Objective:   Physical Exam  Constitutional: She is oriented to person, place, and time. She appears well-developed and well-nourished.  Overweight  HENT:  Head: Normocephalic and atraumatic.  Nose: Nose normal.  Mouth/Throat: Oropharynx is clear and moist.  Eyes: EOM are normal.  Neck: Normal range of motion.  Cardiovascular: Normal rate and regular rhythm.   Pulmonary/Chest: Effort normal and breath sounds normal. No respiratory distress. She has no wheezes. She has no rales. She exhibits no tenderness.  Abdominal: Soft. Bowel sounds are normal.  Musculoskeletal: She exhibits no tenderness.  Neurological: She is alert and oriented to person, place, and time. Coordination normal.  Skin: Skin is warm and dry.   Filed Vitals:   03/23/15 1554  BP: 112/82  Pulse: 100  Temp: 98.3 F (36.8 C)  TempSrc: Oral  Resp: 16  Height: 5' (1.524 m)  Weight: 295  lb (133.811 kg)  SpO2: 98%      Assessment & Plan:

## 2015-03-23 NOTE — Patient Instructions (Signed)
We are checking the labs today.   We will have you stop taking the lisinopril/hctz and start taking the new medicine losartan/hctz. It is still 1 pill daily. This should help the cough to go away although it could take 1-2 months for it to go away. We have sent in the tessalon perles to help stop the cough until that time that you can use up to 3 times per day.   We will call you back with advice about the metformin. We may have you try taking 1/2 pill at night time to avoid some of the GI problems.

## 2015-03-24 DIAGNOSIS — R05 Cough: Secondary | ICD-10-CM | POA: Insufficient documentation

## 2015-03-24 DIAGNOSIS — R059 Cough, unspecified: Secondary | ICD-10-CM | POA: Insufficient documentation

## 2015-03-24 NOTE — Assessment & Plan Note (Signed)
Likely ACE-I induced. Will stop lisinopril and transition to losartan. No signs of post-nasal drip on exam.

## 2015-03-24 NOTE — Assessment & Plan Note (Signed)
Not taking metformin due to GI symptoms. Checking HgA1c and adjust as needed. If needed we can try 250 mg metformin in the evenings. Hopefully her weight loss will help. Changing lisinopril to losartan for cough.

## 2015-03-27 ENCOUNTER — Encounter: Payer: Self-pay | Admitting: Internal Medicine

## 2015-05-21 ENCOUNTER — Encounter: Payer: Self-pay | Admitting: Internal Medicine

## 2015-06-07 ENCOUNTER — Ambulatory Visit: Payer: BLUE CROSS/BLUE SHIELD | Admitting: Family Medicine

## 2015-06-11 ENCOUNTER — Encounter: Payer: Self-pay | Admitting: Family Medicine

## 2015-08-09 ENCOUNTER — Ambulatory Visit (INDEPENDENT_AMBULATORY_CARE_PROVIDER_SITE_OTHER): Payer: BLUE CROSS/BLUE SHIELD | Admitting: Family Medicine

## 2015-08-09 ENCOUNTER — Encounter: Payer: Self-pay | Admitting: Family Medicine

## 2015-08-09 VITALS — BP 116/82 | HR 83 | Ht 60.0 in | Wt 291.0 lb

## 2015-08-09 DIAGNOSIS — M129 Arthropathy, unspecified: Secondary | ICD-10-CM | POA: Diagnosis not present

## 2015-08-09 DIAGNOSIS — IMO0002 Reserved for concepts with insufficient information to code with codable children: Secondary | ICD-10-CM

## 2015-08-09 NOTE — Assessment & Plan Note (Signed)
Patient given injection today and tolerated the procedure well. Patient will come back again in 2-3 weeks. If worsening symptoms she would be able to restart the viscous supplementation. Continue conservative therapy including bracing.  Spent  25 minutes with patient face-to-face and had greater than 50% of counseling including as described above in assessment and plan.

## 2015-08-09 NOTE — Progress Notes (Signed)
[name] [address], [place] [address] Phone: [phone] Subjective:     CC: Right knee pain follow-up  [id number] Lynn Fields is a 45 y.o. female coming in with complaint of Right  knee pain. 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 Patient did have a viscous supplementation series on the right knee previously. Last one was a year ago. Patient has custom brace as well. Patient states the pain is starting to come back again. Discuss as a dull, throbbing aching sensation. Starting to affect daily activities. Can keep her up at night as well. Not responding as well to the over-the-counter medications.     Past Medical History  Diagnosis Date  . Diabetes mellitus without complication ([place])   . Hypertension   . Migraines   . Depression   . Ulcer    Past Surgical History  Procedure Laterality Date  . Cesarean section  1993  . Tubal ligation  1993   Social History  Substance Use Topics  . Smoking status: Never Smoker   . Smokeless tobacco: Never Used  . Alcohol Use: No   Allergies  Allergen Reactions  . Codeine Itching  . Morphine And Related Itching  . Penicillins Swelling  . Sudafed [Pseudoephedrine Hcl] Swelling  . Azithromycin Rash  . Erythromycin Rash   Filed Weights   08/09/15 1108  Weight: 291 lb (131.997 kg)     Past medical history, social, surgical and family history all reviewed in electronic medical record.   Review of Systems: No headache, visual changes, nausea, vomiting, diarrhea, constipation, dizziness, abdominal pain, skin rash, fevers, chills, night sweats, weight loss, swollen lymph nodes, body aches, joint swelling, muscle aches, chest pain, shortness of breath, mood changes.   Objective Blood pressure 116/82, pulse 83, height 5' (1.524 m), weight 291 lb (131.997 kg), SpO2 98 %.  General: No apparent distress alert and oriented x3 mood and affect normal, dressed  appropriately.  HEENT: Pupils equal, extraocular movements intact  Respiratory: Patient's speak in full sentences and does not appear short of breath  Cardiovascular: No lower extremity edema, non tender, no erythema  Skin: Warm dry intact with no signs of infection or rash on extremities or on axial skeleton.  Abdomen: Soft nontender  Neuro: Cranial nerves II through XII are intact, neurovascularly intact in all extremities with 2+ DTRs and 2+ pulses.  Lymph: No lymphadenopathy of posterior or anterior cervical chain or axillae bilaterally.  Gait normal with good balance and coordination.  MSK:  Non tender with full range of motion and good stability and symmetric strength and tone of shoulders, elbows, wrist, hip, and ankles bilaterally.  Knee: Right  Difficult to assess secondary to patient's body habitus Tender to palpation over the medial joint line ROM full in flexion and extension and lower leg rotation. Ligaments with solid consistent endpoints including ACL, PCL, LCL, MCL. Negative Mcmurray's, Apley's, and Thessalonian tests. mild painful patellar compression. Patellar glide with mild crepitus. Patellar and quadriceps tendons unremarkable. Hamstring and quadriceps strength is normal.  Contralateral knee does have some mild pain and does have some mild crepitus on range of motion.  After informed written and verbal consent, patient was seated on exam table. Right knee was prepped with alcohol swab and utilizing anterolateral approach, patient's right knee space was injected with 4:1  marcaine 0.5%: Kenalog 40mg /dL. Patient tolerated the procedure well without immediate complications.     Impression and Recommendations:     This  case required medical decision making of moderate complexity.

## 2015-08-09 NOTE — Progress Notes (Signed)
Pre visit review using our clinic review tool, if applicable. No additional management support is needed unless otherwise documented below in the visit note. 

## 2015-08-09 NOTE — Patient Instructions (Signed)
Good to see you It is what it is.  Ice is your friend Wear brace when you can.  pennsaid pinkie amount topically 2 times daily as needed.  See me again i 2 weeks and we will start the series.

## 2015-08-16 ENCOUNTER — Other Ambulatory Visit (INDEPENDENT_AMBULATORY_CARE_PROVIDER_SITE_OTHER): Payer: BLUE CROSS/BLUE SHIELD

## 2015-08-16 ENCOUNTER — Ambulatory Visit (INDEPENDENT_AMBULATORY_CARE_PROVIDER_SITE_OTHER): Payer: BLUE CROSS/BLUE SHIELD | Admitting: Internal Medicine

## 2015-08-16 ENCOUNTER — Encounter: Payer: Self-pay | Admitting: Internal Medicine

## 2015-08-16 VITALS — BP 118/80 | HR 82 | Temp 98.3°F | Resp 16 | Ht 60.0 in | Wt 288.4 lb

## 2015-08-16 DIAGNOSIS — E119 Type 2 diabetes mellitus without complications: Secondary | ICD-10-CM | POA: Diagnosis not present

## 2015-08-16 DIAGNOSIS — I10 Essential (primary) hypertension: Secondary | ICD-10-CM

## 2015-08-16 DIAGNOSIS — Z0000 Encounter for general adult medical examination without abnormal findings: Secondary | ICD-10-CM | POA: Insufficient documentation

## 2015-08-16 LAB — COMPREHENSIVE METABOLIC PANEL
ALK PHOS: 64 U/L (ref 39–117)
ALT: 11 U/L (ref 0–35)
AST: 10 U/L (ref 0–37)
Albumin: 3.8 g/dL (ref 3.5–5.2)
BILIRUBIN TOTAL: 0.5 mg/dL (ref 0.2–1.2)
BUN: 16 mg/dL (ref 6–23)
CALCIUM: 9.4 mg/dL (ref 8.4–10.5)
CO2: 28 mEq/L (ref 19–32)
Chloride: 101 mEq/L (ref 96–112)
Creatinine, Ser: 0.91 mg/dL (ref 0.40–1.20)
GFR: 86.85 mL/min (ref >60.00)
Glucose, Bld: 212 mg/dL — ABNORMAL HIGH (ref 70–99)
Potassium: 4.2 mEq/L (ref 3.5–5.1)
Sodium: 137 mEq/L (ref 135–145)
TOTAL PROTEIN: 7.3 g/dL (ref 6.0–8.3)

## 2015-08-16 LAB — HEMOGLOBIN A1C: Hgb A1c MFr Bld: 8.4 % — ABNORMAL HIGH (ref 4.6–6.5)

## 2015-08-16 MED ORDER — BUPROPION HCL ER (XL) 150 MG PO TB24: 150.0000 mg | ORAL_TABLET | Freq: Every day | ORAL | Status: DC

## 2015-08-16 NOTE — Progress Notes (Signed)
   Subjective:    Patient ID: Lynn Fields, female    DOB: 05/17/1970, 45 y.o.   MRN: [id number]  HPI The patient is a 45 YO female coming in for wellness. No new concerns but some older concerns she wants to address.  PMH, [facility], social history reviewed and updated.   Review of Systems  Constitutional: Positive for activity change. Negative for fever, appetite change and fatigue.       Exercising more  HENT: Negative.   Eyes: Negative.   Respiratory: Negative for chest tightness, shortness of breath and wheezing.   Cardiovascular: Negative for chest pain, palpitations and leg swelling.  Gastrointestinal: Negative for abdominal pain, diarrhea, constipation and abdominal distention.  Musculoskeletal: Positive for arthralgias. Negative for gait problem.  Skin: Negative.   Neurological: Negative for weakness, light-headedness and headaches.  Psychiatric/Behavioral: Negative.       Objective:   Physical Exam  Constitutional: She is oriented to person, place, and time. She appears well-developed and well-nourished.  Overweight  HENT:  Head: Normocephalic and atraumatic.  Eyes: EOM are normal.  Neck: Normal range of motion.  Cardiovascular: Normal rate and regular rhythm.   Pulmonary/Chest: Effort normal and breath sounds normal. No respiratory distress. She has no wheezes. She has no rales.  Abdominal: Soft. Bowel sounds are normal. She exhibits no distension. There is no tenderness. There is no rebound.  Musculoskeletal: She exhibits no edema.  Neurological: She is alert and oriented to person, place, and time. Coordination normal.  Skin: Skin is warm and dry.  Psychiatric: She has a normal mood and affect.   Filed Vitals:   08/16/15 0928  BP: 118/80  Pulse: 82  Temp: 98.3 F (36.8 C)  TempSrc: Oral  Resp: 16  Height: 5' (1.524 m)  Weight: 288 lb 6.4 oz (130.817 kg)  SpO2: 98%      Assessment & Plan:

## 2015-08-16 NOTE — Patient Instructions (Addendum)
We will check the labs today and call you back with the results.   We will likely send in a new medicine for the sugars after the labs and have you go back to taking 1/2 metformin in the evening. Wait to change until you hear from [place].   We have sent in wellbutrin for the depression which is a once a day medicine that should help better. Call us or send a message in about 3-4 weeks and let us know how you are doing.   Health Maintenance, Female Adopting a healthy lifestyle and getting preventive care can go a long way to promote health and wellness. Talk with your health care provider about what schedule of regular examinations is right for you. This is a good chance for you to check in with your provider about disease prevention and staying healthy. In between checkups, there are plenty of things you can do on your own. Experts have done a lot of research about which lifestyle changes and preventive measures are most likely to keep you healthy. Ask your health care provider for more information. WEIGHT AND DIET  Eat a healthy diet  Be sure to include plenty of vegetables, fruits, low-fat dairy products, and lean protein.  Do not eat a lot of foods high in solid fats, added sugars, or salt.  Get regular exercise. This is one of the most important things you can do for your health.  Most adults should exercise for at least 150 minutes each week. The exercise should increase your heart rate and make you sweat (moderate-intensity exercise).  Most adults should also do strengthening exercises at least twice a week. This is in addition to the moderate-intensity exercise.  Maintain a healthy weight  Body mass index (BMI) is a measurement that can be used to identify possible weight problems. It estimates body fat based on height and weight. Your health care provider can help determine your BMI and help you achieve or maintain a healthy weight.  For females 73 years of age and older:   A BMI  below 18.5 is considered underweight.  A BMI of 18.5 to 24.9 is normal.  A BMI of 25 to 29.9 is considered overweight.  A BMI of 30 and above is considered obese.  Watch levels of cholesterol and blood lipids  You should start having your blood tested for lipids and cholesterol at 43 years of age, then have this test every 5 years.  You may need to have your cholesterol levels checked more often if:  Your lipid or cholesterol levels are high.  You are older than 43 years of age.  You are at high risk for heart disease.  CANCER SCREENING   Lung Cancer  Lung cancer screening is recommended for adults 35-44 years old who are at high risk for lung cancer because of a history of smoking.  A yearly low-dose CT scan of the lungs is recommended for people who:  Currently smoke.  Have quit within the past 15 years.  Have at least a 30-pack-year history of smoking. A pack year is smoking an average of one pack of cigarettes a day for 1 year.  Yearly screening should continue until it has been 15 years since you quit.  Yearly screening should stop if you develop a health problem that would prevent you from having lung cancer treatment.  Breast Cancer  Practice breast self-awareness. This means understanding how your breasts normally appear and feel.  It also means doing regular  breast self-exams. Let your health care provider know about any changes, no matter how small.  If you are in your 20s or 30s, you should have a clinical breast exam (CBE) by a health care provider every 1-3 years as part of a regular health exam.  If you are 70 or older, have a CBE every year. Also consider having a breast X-ray (mammogram) every year.  If you have a family history of breast cancer, talk to your health care provider about genetic screening.  If you are at high risk for breast cancer, talk to your health care provider about having an MRI and a mammogram every year.  Breast cancer gene  (BRCA) assessment is recommended for women who have family members with BRCA-related cancers. BRCA-related cancers include:  Breast.  Ovarian.  Tubal.  Peritoneal cancers.  Results of the assessment will determine the need for genetic counseling and BRCA1 and BRCA2 testing. Cervical Cancer Your health care provider may recommend that you be screened regularly for cancer of the pelvic organs (ovaries, uterus, and vagina). This screening involves a pelvic examination, including checking for microscopic changes to the surface of your cervix (Pap test). You may be encouraged to have this screening done every 3 years, beginning at age 73.  For women ages 68-65, health care providers may recommend pelvic exams and Pap testing every 3 years, or they may recommend the Pap and pelvic exam, combined with testing for human papilloma virus (HPV), every 5 years. Some types of HPV increase your risk of cervical cancer. Testing for HPV may also be done on women of any age with unclear Pap test results.  Other health care providers may not recommend any screening for nonpregnant women who are considered low risk for pelvic cancer and who do not have symptoms. Ask your health care provider if a screening pelvic exam is right for you.  If you have had past treatment for cervical cancer or a condition that could lead to cancer, you need Pap tests and screening for cancer for at least 20 years after your treatment. If Pap tests have been discontinued, your risk factors (such as having a new sexual partner) need to be reassessed to determine if screening should resume. Some women have medical problems that increase the chance of getting cervical cancer. In these cases, your health care provider may recommend more frequent screening and Pap tests. Colorectal Cancer  This type of cancer can be detected and often prevented.  Routine colorectal cancer screening usually begins at 43 years of age and continues through  43 years of age.  Your health care provider may recommend screening at an earlier age if you have risk factors for colon cancer.  Your health care provider may also recommend using home test kits to check for hidden blood in the stool.  A small camera at the end of a tube can be used to examine your colon directly (sigmoidoscopy or colonoscopy). This is done to check for the earliest forms of colorectal cancer.  Routine screening usually begins at age 52.  Direct examination of the colon should be repeated every 5-10 years through 43 years of age. However, you may need to be screened more often if early forms of precancerous polyps or small growths are found. Skin Cancer  Check your skin from head to toe regularly.  Tell your health care provider about any new moles or changes in moles, especially if there is a change in a mole's shape or color.  Also tell your health care provider if you have a mole that is larger than the size of a pencil eraser.  Always use sunscreen. Apply sunscreen liberally and repeatedly throughout the day.  Protect yourself by wearing long sleeves, pants, a wide-brimmed hat, and sunglasses whenever you are outside. HEART DISEASE, DIABETES, AND HIGH BLOOD PRESSURE   High blood pressure causes heart disease and increases the risk of stroke. High blood pressure is more likely to develop in:  People who have blood pressure in the high end of the normal range (130-139/85-89 mm Hg).  People who are overweight or obese.  People who are African American.  If you are 17-51 years of age, have your blood pressure checked every 3-5 years. If you are 74 years of age or older, have your blood pressure checked every year. You should have your blood pressure measured twice--once when you are at a hospital or clinic, and once when you are not at a hospital or clinic. Record the average of the two measurements. To check your blood pressure when you are not at a hospital or  clinic, you can use:  An automated blood pressure machine at a pharmacy.  A home blood pressure monitor.  If you are between 52 years and 26 years old, ask your health care provider if you should take aspirin to prevent strokes.  Have regular diabetes screenings. This involves taking a blood sample to check your fasting blood sugar level.  If you are at a normal weight and have a low risk for diabetes, have this test once every three years after 43 years of age.  If you are overweight and have a high risk for diabetes, consider being tested at a younger age or more often. PREVENTING INFECTION  Hepatitis B  If you have a higher risk for hepatitis B, you should be screened for this virus. You are considered at high risk for hepatitis B if:  You were born in a country where hepatitis B is common. Ask your health care provider which countries are considered high risk.  Your parents were born in a high-risk country, and you have not been immunized against hepatitis B (hepatitis B vaccine).  You have HIV or AIDS.  You use needles to inject street drugs.  You live with someone who has hepatitis B.  You have had sex with someone who has hepatitis B.  You get hemodialysis treatment.  You take certain medicines for conditions, including cancer, organ transplantation, and autoimmune conditions. Hepatitis C  Blood testing is recommended for:  Everyone born from [birth year] through 1963.  Anyone with known risk factors for hepatitis C. Sexually transmitted infections (STIs)  You should be screened for sexually transmitted infections (STIs) including gonorrhea and chlamydia if:  You are sexually active and are younger than 42 years of age.  You are older than 43 years of age and your health care provider tells you that you are at risk for this type of infection.  Your sexual activity has changed since you were last screened and you are at an increased risk for chlamydia or gonorrhea. Ask  your health care provider if you are at risk.  If you do not have HIV, but are at risk, it may be recommended that you take a prescription medicine daily to prevent HIV infection. This is called pre-exposure prophylaxis (PrEP). You are considered at risk if:  You are sexually active and do not regularly use condoms or know the HIV status of your  partner(s).  You take drugs by injection.  You are sexually active with a partner who has HIV. Talk with your health care provider about whether you are at high risk of being infected with HIV. If you choose to begin PrEP, you should first be tested for HIV. You should then be tested every 3 months for as long as you are taking PrEP.  PREGNANCY   If you are premenopausal and you may become pregnant, ask your health care provider about preconception counseling.  If you may become pregnant, take 400 to 800 micrograms (mcg) of folic acid every day.  If you want to prevent pregnancy, talk to your health care provider about birth control (contraception). OSTEOPOROSIS AND MENOPAUSE   Osteoporosis is a disease in which the bones lose minerals and strength with aging. This can result in serious bone fractures. Your risk for osteoporosis can be identified using a bone density scan.  If you are 49 years of age or older, or if you are at risk for osteoporosis and fractures, ask your health care provider if you should be screened.  Ask your health care provider whether you should take a calcium or vitamin D supplement to lower your risk for osteoporosis.  Menopause may have certain physical symptoms and risks.  Hormone replacement therapy may reduce some of these symptoms and risks. Talk to your health care provider about whether hormone replacement therapy is right for you.  HOME CARE INSTRUCTIONS   Schedule regular health, dental, and eye exams.  Stay current with your immunizations.   Do not use any tobacco products including cigarettes, chewing  tobacco, or electronic cigarettes.  If you are pregnant, do not drink alcohol.  If you are breastfeeding, limit how much and how often you drink alcohol.  Limit alcohol intake to no more than 1 drink per day for nonpregnant women. One drink equals 12 ounces of beer, 5 ounces of wine, or 1 ounces of hard liquor.  Do not use street drugs.  Do not share needles.  Ask your health care provider for help if you need support or information about quitting drugs.  Tell your health care provider if you often feel depressed.  Tell your health care provider if you have ever been abused or do not feel safe at home.   This information is not intended to replace advice given to you by your health care provider. Make sure you discuss any questions you have with your health care provider.   Document Released: 07/29/2010 Document Revised: 02/03/2014 Document Reviewed: 12/15/2012 Elsevier Interactive Patient Education [employer].

## 2015-08-16 NOTE — Progress Notes (Signed)
Pre visit review using our clinic review tool, if applicable. No additional management support is needed unless otherwise documented below in the visit note. 

## 2015-08-16 NOTE — Assessment & Plan Note (Signed)
BP at goal on her losartan/hctz. Checking CMP and adjust as needed.  

## 2015-08-16 NOTE — Assessment & Plan Note (Signed)
Weight is still coming back down. She is taking her metformin 500 mg BID which is giving her diarrhea and she wants to get off this. Was doing better on 250 mg at night time. Checking HgA1c and if needed add additional agent and keep metformin 250 mg qhs.

## 2015-08-16 NOTE — Assessment & Plan Note (Signed)
Checking labs today, colonoscopy due at 77. Immunizations up to date and reminded about yearly flu shot. Counseled about the dangers of distracted driving as well as sun safety. Given screening recommendations.

## 2015-08-17 ENCOUNTER — Other Ambulatory Visit: Payer: Self-pay | Admitting: Internal Medicine

## 2015-08-17 MED ORDER — GLIPIZIDE ER 5 MG PO TB24
5.0000 mg | ORAL_TABLET | Freq: Every day | ORAL | Status: DC
Start: 2015-08-17 — End: 2015-10-04

## 2015-08-30 ENCOUNTER — Ambulatory Visit: Payer: BLUE CROSS/BLUE SHIELD | Admitting: Family Medicine

## 2015-09-05 ENCOUNTER — Encounter: Payer: BLUE CROSS/BLUE SHIELD | Admitting: Internal Medicine

## 2015-10-03 ENCOUNTER — Other Ambulatory Visit: Payer: Self-pay | Admitting: Internal Medicine

## 2015-10-03 ENCOUNTER — Encounter: Payer: Self-pay | Admitting: Internal Medicine

## 2015-10-03 DIAGNOSIS — I10 Essential (primary) hypertension: Secondary | ICD-10-CM

## 2015-10-04 MED ORDER — GLIPIZIDE 10 MG PO TABS: 5.0000 mg | ORAL_TABLET | Freq: Two times a day (BID) | ORAL | 3 refills | Status: DC

## 2015-10-04 MED ORDER — LISINOPRIL-HYDROCHLOROTHIAZIDE 10-12.5 MG PO TABS: ORAL_TABLET | ORAL | 3 refills | Status: DC

## 2016-01-30 ENCOUNTER — Encounter: Payer: Self-pay | Admitting: Internal Medicine

## 2016-01-31 NOTE — Telephone Encounter (Signed)
Called pt and Pt is wanting to go back on the Losartan HCTZ now that she has a different insurance that will cover it,  The lisinopril makes her cough

## 2016-02-04 ENCOUNTER — Encounter: Payer: Self-pay | Admitting: Internal Medicine

## 2016-02-07 ENCOUNTER — Encounter: Payer: Self-pay | Admitting: Family Medicine

## 2016-02-10 NOTE — Progress Notes (Signed)
[facility] [address] [place], [place] [address] Phone: [phone] Subjective:     CC: Right knee pain follow-up  [id number]  Lynn Fields is a 46 y.o. female coming in with complaint of Right  knee pain. 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 Patient did have a viscous supplementation series on the right knee previously. Patient was last seen by me 6 months ago. At that time should have a repeat steroid injection. Patient statesNow having bilateral knee pain. States that she has tried been active. Attempted to lose weight but finds it difficult secondary to pain. Rates the severity pain is 8 out of 10. Worsening symptoms. Mild increase in instability.     Past Medical History:  Diagnosis Date  . Depression   . Diabetes mellitus without complication ([place])   . Hypertension   . Migraines   . Ulcer [facility])    Past Surgical History:  Procedure Laterality Date  . CESAREAN SECTION  1993  . TUBAL LIGATION  1993   Social History  Substance Use Topics  . Smoking status: Never Smoker  . Smokeless tobacco: Never Used  . Alcohol use No   Allergies  Allergen Reactions  . Codeine Itching  . Morphine And Related Itching  . Penicillins Swelling  . Sudafed [Pseudoephedrine Hcl] Swelling  . Azithromycin Rash  . Erythromycin Rash   Filed Weights   02/11/16 1459  Weight: 281 lb (127.5 kg)     Past medical history, social, surgical and family history all reviewed in electronic medical record.   Review of Systems: No headache, visual changes, nausea, vomiting, diarrhea, constipation, dizziness, abdominal pain, skin rash, fevers, chills, night sweats, weight loss, swollen lymph nodes, chest pain, shortness of breath, mood changes.    Objective  Blood pressure 130/84, height 5' 0.25" (1.53 m), weight 281 lb (127.5 kg).  Systems examined below as of 02/11/16 General: NAD A&O x3 mood, affect normal Morbidly  obese HEENT: Pupils equal, extraocular movements intact no nystagmus Respiratory: not short of breath at rest or with speaking Cardiovascular: No lower extremity edema, non tender Skin: Warm dry intact with no signs of infection or rash on extremities or on axial skeleton. Abdomen: Soft nontender, no masses obese Neuro: Cranial nerves  intact, neurovascularly intact in all extremities with 2+ DTRs and 2+ pulses. Lymph: No lymphadenopathy appreciated today  Gait normal with good balance and coordination.  MSK: Non tender with full range of motion and good stability and symmetric strength and tone of shoulders, elbows, wrist,  hips and ankles bilaterally.   Knee: Right  Difficult to assess secondary to patient's body habitus Worsening tenderness over the medial joint line. ROM full in flexion and extension and lower leg rotation. Mild discomfort with valgus deformity and mild instability with valgus force Negative Mcmurray's, Apley's, and Thessalonian tests. mild painful patellar compression. Patellar glide with mild to moderate crepitus Patellar and quadriceps tendons unremarkable. Hamstring and quadriceps strength is normal.   Contralateral knee is having worsening pain as well. Patient states that this started give her very similar presentation. Mild instability noted with valgus force. Increasing discomfort on the medial joint line.  After informed written and verbal consent, patient was seated on exam table. Right knee was prepped with alcohol swab and utilizing anterolateral approach, patient's right knee space was injected with 4:1  marcaine 0.5%: Kenalog 40mg /dL. Patient tolerated the procedure well without immediate complications.  After informed written and verbal consent, patient was seated  on exam table. Left knee was prepped with alcohol swab and utilizing anterolateral approach, patient's left knee space was injected with 4:1  marcaine 0.5%: Kenalog 40mg /dL. Patient tolerated the  procedure well without immediate complications.     Impression and Recommendations:     This case required medical decision making of moderate complexity.

## 2016-02-11 ENCOUNTER — Encounter: Payer: Self-pay | Admitting: Internal Medicine

## 2016-02-11 ENCOUNTER — Ambulatory Visit (INDEPENDENT_AMBULATORY_CARE_PROVIDER_SITE_OTHER): Payer: BLUE CROSS/BLUE SHIELD | Admitting: Family Medicine

## 2016-02-11 ENCOUNTER — Encounter: Payer: Self-pay | Admitting: Family Medicine

## 2016-02-11 ENCOUNTER — Other Ambulatory Visit (INDEPENDENT_AMBULATORY_CARE_PROVIDER_SITE_OTHER): Payer: Self-pay

## 2016-02-11 ENCOUNTER — Ambulatory Visit (INDEPENDENT_AMBULATORY_CARE_PROVIDER_SITE_OTHER): Payer: Self-pay | Admitting: Internal Medicine

## 2016-02-11 VITALS — BP 130/90 | HR 74 | Temp 98.1°F | Resp 14 | Ht 60.25 in | Wt 281.0 lb

## 2016-02-11 DIAGNOSIS — M170 Bilateral primary osteoarthritis of knee: Secondary | ICD-10-CM | POA: Diagnosis not present

## 2016-02-11 DIAGNOSIS — Z23 Encounter for immunization: Secondary | ICD-10-CM

## 2016-02-11 DIAGNOSIS — E1165 Type 2 diabetes mellitus with hyperglycemia: Secondary | ICD-10-CM

## 2016-02-11 DIAGNOSIS — Q998 Other specified chromosome abnormalities: Secondary | ICD-10-CM

## 2016-02-11 DIAGNOSIS — IMO0001 Reserved for inherently not codable concepts without codable children: Secondary | ICD-10-CM

## 2016-02-11 DIAGNOSIS — R7309 Other abnormal glucose: Secondary | ICD-10-CM

## 2016-02-11 DIAGNOSIS — Q738 Other reduction defects of unspecified limb(s): Secondary | ICD-10-CM

## 2016-02-11 DIAGNOSIS — I10 Essential (primary) hypertension: Secondary | ICD-10-CM

## 2016-02-11 LAB — LIPID PANEL
Cholesterol: 255 mg/dL — ABNORMAL HIGH (ref 0–200)
HDL: 55.9 mg/dL (ref >39.00)
LDL Cholesterol: 160 mg/dL — ABNORMAL HIGH (ref 0–99)
NonHDL: 198.73
TRIGLYCERIDES: 195 mg/dL — AB (ref 0.0–149.0)
Total CHOL/HDL Ratio: 5
VLDL: 39 mg/dL (ref 0.0–40.0)

## 2016-02-11 LAB — COMPREHENSIVE METABOLIC PANEL
ALK PHOS: 69 U/L (ref 39–117)
ALT: 11 U/L (ref 0–35)
AST: 11 U/L (ref 0–37)
Albumin: 4.1 g/dL (ref 3.5–5.2)
BUN: 10 mg/dL (ref 6–23)
CALCIUM: 9.6 mg/dL (ref 8.4–10.5)
CO2: 27 mEq/L (ref 19–32)
CREATININE: 0.76 mg/dL (ref 0.40–1.20)
Chloride: 101 mEq/L (ref 96–112)
GFR: 106.67 mL/min (ref >60.00)
GLUCOSE: 183 mg/dL — AB (ref 70–99)
Potassium: 3.7 mEq/L (ref 3.5–5.1)
Sodium: 136 mEq/L (ref 135–145)
TOTAL PROTEIN: 7.5 g/dL (ref 6.0–8.3)
Total Bilirubin: 0.3 mg/dL (ref 0.2–1.2)

## 2016-02-11 LAB — HEMOGLOBIN A1C
Hgb A1c MFr Bld: 9.5 % — ABNORMAL HIGH (ref <5.7)
Mean Plasma Glucose: 226 mg/dL

## 2016-02-11 MED ORDER — LOSARTAN POTASSIUM-HCTZ 50-12.5 MG PO TABS
1.0000 | ORAL_TABLET | Freq: Every day | ORAL | 3 refills | Status: DC
Start: 2016-02-11 — End: 2016-08-07

## 2016-02-11 MED ORDER — METFORMIN HCL 500 MG PO TABS: 500.0000 mg | ORAL_TABLET | Freq: Every day | ORAL | 3 refills | Status: DC

## 2016-02-11 NOTE — Progress Notes (Signed)
Pre visit review using our clinic review tool, if applicable. No additional management support is needed unless otherwise documented below in the visit note. 

## 2016-02-11 NOTE — Assessment & Plan Note (Signed)
Bilateral injections given today. We discussed icing regimen and home exercise. Encourage weight loss. Doesn't topical anti-inflammatories. We discussed continuing the bracing. Follow-up again more on an as-needed basis. Would be a candidate for viscous supplementation again if needed.

## 2016-02-11 NOTE — Progress Notes (Signed)
   Subjective:    Patient ID: [facility], female    DOB: [birth year], 46 y.o.   MRN: [id number]  HPI The patient is a 46 YO female coming in for follow up of her diabetes. Her sugars have been up the last several months with the holidays. She is also up several pounds as well. She started glipizide after visit in July. She is not able to tolerate metformin well due to GI side effects. She takes it at night time due to not being able to have these symptoms at her job. She is having some new tingling in her feet since the last visit and attributes that to her sugars being up some.   Review of Systems  Constitutional: Positive for activity change. Negative for appetite change, diaphoresis, fatigue and fever.  Respiratory: Negative.   Cardiovascular: Negative.   Gastrointestinal: Negative.   Musculoskeletal: Negative for arthralgias, back pain, gait problem, joint swelling and myalgias.  Skin: Negative.   Neurological: Negative for dizziness, weakness, light-headedness, numbness and headaches.       Tingling in her feet  Psychiatric/Behavioral: Negative.       Objective:   Physical Exam  Constitutional: She is oriented to person, place, and time. She appears well-developed and well-nourished.  HENT:  Head: Normocephalic and atraumatic.  Eyes: EOM are normal.  Cardiovascular: Normal rate and regular rhythm.   Pulmonary/Chest: Effort normal. No respiratory distress. She has no wheezes. She has no rales.  Abdominal: Soft.  Neurological: She is alert and oriented to person, place, and time. Coordination normal.  Skin: Skin is warm and dry.   Vitals:   02/11/16 1531  BP: 130/90  Pulse: 74  Resp: 14  Temp: 98.1 F (36.7 C)  TempSrc: Oral  SpO2: 99%  Weight: 281 lb (127.5 kg)  Height: 5' 0.25" (1.53 m)      Assessment & Plan:  Flu shot given at visit.

## 2016-02-11 NOTE — Patient Instructions (Signed)
We have sent in the losartan to replace the lisinopril.   We are checking the labs today. We have given you the flu shot.  Keep up the good work with diet changes as well as getting into the exercise.    Diabetes Mellitus and Standards of Medical Care Managing diabetes (diabetes mellitus) can be complicated. Your diabetes treatment may be managed by a team of health care providers, including:  A diet and nutrition specialist (registered dietitian).  A nurse.  A certified diabetes educator (CDE).  A diabetes specialist (endocrinologist).  An eye doctor.  A primary care provider.  A dentist. Your health care providers follow a schedule in order to help you get the best quality of care. The following schedule is a general guideline for your diabetes management plan. Your health care providers may also give you more specific instructions. HbA1c ( hemoglobin A1c) test This test provides information about blood sugar (glucose) control over the previous 2-3 months. It is used to check whether your diabetes management plan needs to be adjusted.  If you are meeting your treatment goals, this test is done at least 2 times a year.  If you are not meeting treatment goals or if your treatment goals have changed, this test is done 4 times a year. Blood pressure test  This test is done at every routine medical visit. For most people, the goal is less than 140/90. In some cases, your goal blood pressure may be 130/80 or less. Ask your health care provider what your goal blood pressure should be. Dental and eye exams  Visit your dentist two times a year.  If you have type 1 diabetes, get an eye exam 3-5 years after you are diagnosed, and then once a year after your first exam.  If you were diagnosed with type 1 diabetes as a child, get an eye exam when you are age 51 or older and have had diabetes for 3-5 years. After the first exam, you should get an eye exam once a year.  If you have type  2 diabetes, have an eye exam as soon as you are diagnosed, and then once a year after your first exam. Foot care exam  Visual foot exams are done at every routine medical visit. The exams check for cuts, bruises, redness, blisters, sores, or other problems with the feet.  A complete foot exam is done by your health care provider once a year. This exam includes an inspection of the structure and skin of your feet, and a check of the pulses and sensation in your feet.  Type 1 diabetes: Get your first exam 3-5 years after diagnosis.  Type 2 diabetes: Get your first exam as soon as you are diagnosed.  Check your feet every day for cuts, bruises, redness, blisters, or sores. If you have any of these or other problems that are not healing, contact your health care provider. Kidney function test ( urine microalbumin)  This test is done once a year.  Type 1 diabetes: Get your first test 5 years after diagnosis.  Type 2 diabetes: Get your first test as soon as you are diagnosed.  If you have chronic kidney disease (CKD), get a serum creatinine and estimated glomerular filtration rate (eGFR) test once a year. Lipid profile (cholesterol, HDL, LDL, triglycerides)  This test should be done when you are diagnosed with diabetes, and every 5 years after the first test. If you are on medicines to lower your cholesterol, you may need  to get this test done every year.  The goal for LDL is less than 100 mg/dL (5.5 mmol/L). If you are at high risk, the goal is less than 70 mg/dL (3.9 mmol/L).  The goal for HDL is 40 mg/dL (2.2 mmol/L) for men and 50 mg/dL(2.8 mmol/L) for women. An HDL cholesterol of 60 mg/dL (3.3 mmol/L) or higher gives some protection against heart disease.  The goal for triglycerides is less than 150 mg/dL (8.3 mmol/L). Immunizations  The yearly flu (influenza) vaccine is recommended for everyone 6 months or older who has diabetes.  The pneumonia (pneumococcal) vaccine is recommended  for everyone 2 years or older who has diabetes. If you are 52 or older, you may get the pneumonia vaccine as a series of two separate shots.  The hepatitis B vaccine is recommended for adults shortly after they have been diagnosed with diabetes.  The Tdap (tetanus, diphtheria, and pertussis) vaccine should be given:  According to normal childhood vaccination schedules, for children.  Every 10 years, for adults who have diabetes.  The shingles vaccine is recommended for people who have had chicken pox and are 50 years or older. Mental and emotional health  Screening for symptoms of eating disorders, anxiety, and depression is recommended at the time of diagnosis and afterward as needed. If your screening shows that you have symptoms (you have a positive screening result), you may need further evaluation and be referred to a mental health care provider. Diabetes self-management education  Education about how to manage your diabetes is recommended at diagnosis and ongoing as needed. Treatment plan  Your treatment plan will be reviewed at every medical visit. Summary  Managing diabetes (diabetes mellitus) can be complicated. Your diabetes treatment may be managed by a team of health care providers.  Your health care providers follow a schedule in order to help you get the best quality of care.  Standards of care including having regular physical exams, blood tests, blood pressure monitoring, immunizations, screening tests, and education about how to manage your diabetes.  Your health care providers may also give you more specific instructions based on your individual health. This information is not intended to replace advice given to you by your health care provider. Make sure you discuss any questions you have with your health care provider. Document Released: 11/10/2008 Document Revised: 10/12/2015 Document Reviewed: 10/12/2015 Elsevier Interactive Patient Education  2017 [employer].

## 2016-02-11 NOTE — Patient Instructions (Addendum)
great to see you  Lynn Fields is your friend.  Keep being active and continue to work on the weight.  We injected both knees and you know we can repeat every 3 months and if not better we can do the other injections as well.  Make an appointment in 3-4 weeks to make sure you are doing well. Cancel if you are fine.

## 2016-02-12 NOTE — Assessment & Plan Note (Signed)
Last HgA1c was increased and she did add glipizide which has not caused low sugars. Checking HgA1c and expected poor control with holidays and weight gain. May need to add another medication as she is not able to tolerate a large dose of metformin due to GI side effects. New complication of neuropathy in her feet which is not severe enough to warrant medication.

## 2016-02-12 NOTE — Assessment & Plan Note (Signed)
BP at goal on her losartan/hctz and recent CMP okay no need for repeat today.

## 2016-02-15 ENCOUNTER — Telehealth: Payer: Self-pay | Admitting: Emergency Medicine

## 2016-02-15 ENCOUNTER — Other Ambulatory Visit: Payer: Self-pay | Admitting: Internal Medicine

## 2016-02-15 MED ORDER — PIOGLITAZONE HCL 30 MG PO TABS: 30.0000 mg | ORAL_TABLET | Freq: Every day | ORAL | 3 refills | Status: DC

## 2016-02-15 NOTE — Telephone Encounter (Signed)
New medication actose sent in

## 2016-02-15 NOTE — Telephone Encounter (Signed)
Pt called and wants to know which medication Dr Sharlet Salina would like to add for her diabetes. Please advise thanks.

## 2016-02-17 ENCOUNTER — Encounter: Payer: Self-pay | Admitting: Internal Medicine

## 2016-03-06 ENCOUNTER — Ambulatory Visit (INDEPENDENT_AMBULATORY_CARE_PROVIDER_SITE_OTHER): Payer: BLUE CROSS/BLUE SHIELD | Admitting: Family Medicine

## 2016-03-06 VITALS — BP 128/80 | HR 87 | Temp 98.3°F | Resp 16 | Ht 60.0 in | Wt 288.0 lb

## 2016-03-06 DIAGNOSIS — J111 Influenza due to unidentified influenza virus with other respiratory manifestations: Secondary | ICD-10-CM

## 2016-03-06 MED ORDER — PROMETHAZINE HCL 12.5 MG PO TABS: 12.5000 mg | ORAL_TABLET | Freq: Four times a day (QID) | ORAL | 0 refills | Status: DC | PRN

## 2016-03-06 MED ORDER — OSELTAMIVIR PHOSPHATE 75 MG PO CAPS: 75.0000 mg | ORAL_CAPSULE | Freq: Two times a day (BID) | ORAL | 0 refills | Status: DC

## 2016-03-06 NOTE — Progress Notes (Signed)
Lynn Fields is a 46 y.o. female who presents to [facility] at [facility] today for flu like symptoms:  1.  Flu: Started on Tuesday with some runny nose and malaise. Yesterday, began having increasing myalgias, arthralgias, fever to 101, cough, runny nose.  Feels worse today.  Same symptoms.  Has slept much of the day.  No chest pain.  No palpitations or SOB.  No abdominal pain.  Does have some nausea.  Vomiting x 1 earlier today.  No diarrhea, no constipation.  Chills as well.    No LE edema.    ROS as above.   PMH reviewed. Patient is a nonsmoker.   Past Medical History:  Diagnosis Date  . Arthritis   . Depression   . Diabetes mellitus without complication ([place])   . Hypertension   . Migraines   . Ulcer [facility])    Past Surgical History:  Procedure Laterality Date  . CESAREAN SECTION  1993  . TUBAL LIGATION  1993    Medications reviewed. Current Outpatient Prescriptions  Medication Sig Dispense Refill  . acetaminophen (TYLENOL) 500 MG tablet Take 1,000 mg by mouth every 6 (six) hours as needed for moderate pain.    . benzonatate (TESSALON) 200 MG capsule Take 1 capsule (200 mg total) by mouth 3 (three) times daily as needed for cough. 60 capsule 1  . buPROPion (WELLBUTRIN XL) 150 MG 24 hr tablet Take 1 tablet (150 mg total) by mouth daily. 30 tablet 6  . cyclobenzaprine (FLEXERIL) 10 MG tablet Take 1 tablet (10 mg total) by mouth 3 (three) times daily as needed for muscle spasms. 60 tablet 2  . Diclofenac Sodium 2 % SOLN Apply 1 pump twice daily. 112 g 3  . glipiZIDE (GLUCOTROL) 10 MG tablet Take 0.5 tablets (5 mg total) by mouth 2 (two) times daily before a meal. 60 tablet 3  . losartan-hydrochlorothiazide (HYZAAR) 50-12.5 MG tablet Take 1 tablet by mouth daily. 90 tablet 3  . meclizine (ANTIVERT) 25 MG tablet Take 1 tablet (25 mg total) by mouth 3 (three) times daily as needed for dizziness. 30 tablet 0  . metFORMIN (GLUCOPHAGE) 500 MG tablet Take 1 tablet (500 mg total) by mouth  daily with breakfast. 90 tablet 3  . pioglitazone (ACTOS) 30 MG tablet Take 1 tablet (30 mg total) by mouth daily. 90 tablet 3   No current facility-administered medications for this visit.      Physical Exam:  BP 128/80 (BP Location: Left Arm, Patient Position: Sitting, Cuff Size: Large)   Pulse 87   Temp 98.3 F (36.8 C) (Oral)   Resp 16   Ht 5' (1.524 m)   Wt 288 lb (130.6 kg)   LMP 02/28/2016 (Exact Date)   SpO2 99%   BMI 56.25 kg/m  Gen:  Patient sitting on exam table, appears stated age in no acute distress.  Looks as if she feels ill.  Wearing facemask.   Head: Normocephalic atraumatic Eyes: EOMI, PERRL, sclera and conjunctiva non-erythematous Ears:  Canals clear bilaterally.  TMs pearly gray bilaterally without erythema or bulging.   Nose:  Nasal turbinates grossly enlarged with clear exudates BL.   Mouth: Mucosa membranes moist. Tonsils +2, nonenlarged, non-erythematous. Neck: No cervical lymphadenopathy noted Heart:  RRR, no murmurs auscultated. Pulm:  Clear to auscultation bilaterally with good air movement.  No wheezes or rales noted.     Assessment and Plan:  1.  Influenza - did receive a flu shot this year.  - as she  has history of uncontrolled DM2/immunocompromised state (last CBG was >200 yesterday), plan to treat with Tamiflu. - Phernergan for sx relief of nausea.  No red flags of abdominal issues.   - FU if no improvement in 1 week.  Sooner if worsening.

## 2016-03-06 NOTE — Patient Instructions (Addendum)
It was good to meet you today  I'm sorry you are feeling bad.  I do indeed think you have the flu.  Take the Tamiflu as prescribed. Use the Phenergan if you feel sick on your stomach.  I have written you out until Monday.   If you start feeling worse despite treatment, come back and see [place].   Influenza, Adult Influenza ("the flu") is an infection in the lungs, nose, and throat (respiratory tract). It is caused by a virus. The flu causes many common cold symptoms, as well as a high fever and body aches. It can make you feel very sick. The flu spreads easily from person to person (is contagious). Getting a flu shot (influenza vaccination) every year is the best way to prevent the flu. Follow these instructions at home:  Take over-the-counter and prescription medicines only as told by your doctor.  Use a cool mist humidifier to add moisture (humidity) to the air in your home. This can make it easier to breathe.  Rest as needed.  Drink enough fluid to keep your pee (urine) clear or pale yellow.  Cover your mouth and nose when you cough or sneeze.  Wash your hands with soap and water often, especially after you cough or sneeze. If you cannot use soap and water, use hand sanitizer.  Stay home from work or school as told by your doctor. Unless you are visiting your doctor, try to avoid leaving home until your fever has been gone for 24 hours without the use of medicine.  Keep all follow-up visits as told by your doctor. This is important. How is this prevented?  Getting a yearly (annual) flu shot is the best way to avoid getting the flu. You may get the flu shot in late summer, fall, or winter. Ask your doctor when you should get your flu shot.  Wash your hands often or use hand sanitizer often.  Avoid contact with people who are sick during cold and flu season.  Eat healthy foods.  Drink plenty of fluids.  Get enough sleep.  Exercise regularly. Contact a doctor if:  You  get new symptoms.  You have:  Chest pain.  Watery poop (diarrhea).  A fever.  Your cough gets worse.  You start to have more mucus.  You feel sick to your stomach (nauseous).  You throw up (vomit). Get help right away if:  You start to be short of breath or have trouble breathing.  Your skin or nails turn a bluish color.  You have very bad pain or stiffness in your neck.  You get a sudden headache.  You get sudden pain in your face or ear.  You cannot stop throwing up. This information is not intended to replace advice given to you by your health care provider. Make sure you discuss any questions you have with your health care provider. Document Released: 10/23/2007 Document Revised: 06/21/2015 Document Reviewed: 11/07/2014 Elsevier Interactive Patient Education  2017 [employer].    IF you received an x-ray today, you will receive an invoice from [facility]. Please contact [facility] at [phone] with questions or concerns regarding your invoice.   IF you received labwork today, you will receive an invoice from [facility]. Please contact [facility] at [phone] with questions or concerns regarding your invoice.   Our billing staff will not be able to assist you with questions regarding bills from these companies.  You will be contacted with the lab results as soon as they are  available. The fastest way to get your results is to activate your My Chart account. Instructions are located on the last page of this paperwork. If you have not heard from us regarding the results in 2 weeks, please contact this office.      

## 2016-04-15 DIAGNOSIS — N946 Dysmenorrhea, unspecified: Secondary | ICD-10-CM | POA: Insufficient documentation

## 2016-04-15 DIAGNOSIS — N920 Excessive and frequent menstruation with regular cycle: Secondary | ICD-10-CM | POA: Insufficient documentation

## 2016-04-26 ENCOUNTER — Other Ambulatory Visit: Payer: Self-pay | Admitting: Family Medicine

## 2016-05-07 ENCOUNTER — Encounter: Payer: Self-pay | Admitting: Internal Medicine

## 2016-05-22 NOTE — Patient Instructions (Signed)
Your procedure is scheduled on:  Monday, Jun 02, 2016  Enter through the [employer] [facility] at:  6:00 AM  Pick up the phone at the desk and dial [phone].  Call this number if you have problems the morning of surgery: [phone].  Remember: Do NOT eat food or drink after:  Midnight Sunday  Take these medicines the morning of surgery with a SIP OF WATER:  Losartan, Wellbutrin  Stop ALL herbal medications at this time  Do NOT smoke the day of surgery.  Do NOT wear jewelry (body piercing), metal hair clips/bobby pins, make-up, or nail polish. Do NOT wear lotions, powders, or perfumes.  You may wear deodorant. Do NOT shave for 48 hours prior to surgery. Do NOT bring valuables to the hospital. Contacts, dentures, or bridgework may not be worn into surgery.   Have a responsible adult drive you home and stay with you for 24 hours after your procedure  Bring a copy of your healthcare power of attorney and living will documents.

## 2016-05-23 ENCOUNTER — Encounter (HOSPITAL_COMMUNITY)
Admission: RE | Admit: 2016-05-23 | Discharge: 2016-05-23 | Disposition: A | Discharge status: Home / Self Care | Payer: BLUE CROSS/BLUE SHIELD | Source: Ambulatory Visit | Attending: Obstetrics and Gynecology | Admitting: Obstetrics and Gynecology

## 2016-05-23 ENCOUNTER — Encounter (HOSPITAL_COMMUNITY): Payer: Self-pay

## 2016-05-23 ENCOUNTER — Other Ambulatory Visit: Payer: Self-pay

## 2016-05-23 DIAGNOSIS — Z01812 Encounter for preprocedural laboratory examination: Secondary | ICD-10-CM | POA: Diagnosis not present

## 2016-05-23 DIAGNOSIS — Z01810 Encounter for preprocedural cardiovascular examination: Secondary | ICD-10-CM | POA: Diagnosis not present

## 2016-05-23 DIAGNOSIS — Z01818 Encounter for other preprocedural examination: Secondary | ICD-10-CM | POA: Diagnosis present

## 2016-05-23 LAB — COMPREHENSIVE METABOLIC PANEL
ALT: 13 U/L — AB (ref 14–54)
AST: 15 U/L (ref 15–41)
Albumin: 3.8 g/dL (ref 3.5–5.0)
Alkaline Phosphatase: 60 U/L (ref 38–126)
Anion gap: 6 (ref 5–15)
BUN: 12 mg/dL (ref 6–20)
CHLORIDE: 103 mmol/L (ref 101–111)
CO2: 27 mmol/L (ref 22–32)
CREATININE: 0.71 mg/dL (ref 0.44–1.00)
Calcium: 9.3 mg/dL (ref 8.9–10.3)
Glucose, Bld: 90 mg/dL (ref 65–99)
Potassium: 3.3 mmol/L — ABNORMAL LOW (ref 3.5–5.1)
Sodium: 136 mmol/L (ref 135–145)
Total Bilirubin: 0.5 mg/dL (ref 0.3–1.2)
Total Protein: 6.8 g/dL (ref 6.5–8.1)

## 2016-05-23 LAB — CBC
HCT: 36.2 % (ref 36.0–46.0)
Hemoglobin: 12.4 g/dL (ref 12.0–15.0)
MCH: 28 pg (ref 26.0–34.0)
MCHC: 34.3 g/dL (ref 30.0–36.0)
MCV: 81.7 fL (ref 78.0–100.0)
PLATELETS: 268 10*3/uL (ref 150–400)
RBC: 4.43 MIL/uL (ref 3.87–5.11)
RDW: 14.2 % (ref 11.5–15.5)
WBC: 6 10*3/uL (ref 4.0–10.5)

## 2016-06-01 ENCOUNTER — Encounter (HOSPITAL_COMMUNITY): Payer: Self-pay | Admitting: Anesthesiology

## 2016-06-01 NOTE — Anesthesia Preprocedure Evaluation (Addendum)
Anesthesia Evaluation  Patient identified by MRN, date of birth, ID band Patient awake    Reviewed: Allergy & Precautions, NPO status , Patient's Chart, lab work & pertinent test results  Airway Mallampati: I       Dental no notable dental hx.    Pulmonary    Pulmonary exam normal breath sounds clear to auscultation       Cardiovascular hypertension, Normal cardiovascular exam Rhythm:Regular Rate:Normal     Neuro/Psych    GI/Hepatic negative GI ROS, Neg liver ROS,   Endo/Other  diabetes, Poorly Controlled, Type 2, Oral Hypoglycemic AgentsMorbid obesity  Renal/GU negative Renal ROS     Musculoskeletal   Abdominal (+) + obese,   Peds  Hematology   Anesthesia Other Findings   Reproductive/Obstetrics                            Anesthesia Physical Anesthesia Plan  ASA: III  Anesthesia Plan: General   Post-op Pain Management:    Induction: Intravenous  Airway Management Planned: LMA  Additional Equipment:   Intra-op Plan:   Post-operative Plan:   Informed Consent: I have reviewed the patients History and Physical, chart, labs and discussed the procedure including the risks, benefits and alternatives for the proposed anesthesia with the patient or authorized representative who has indicated his/her understanding and acceptance.     Plan Discussed with: CRNA and Surgeon  Anesthesia Plan Comments:        Anesthesia Quick Evaluation

## 2016-06-01 NOTE — H&P (Signed)
Lynn Fields is an 46 y.o. female G2P2 with a h/o of menorrhagia reporting spotting that begins up to 2 weeks prior to her expected cycle with cramping, painful, heavy bleeding with large clots. States that she bleeds through her clothes at home and work. Of her sisters, all have had hysterectomies and her twin has had an ablation. She has some pain with menses, but usually around the time of heavier flow.  She has had an appropriate workup with a normal EMB and cavity WNL.  She has had a tubal ligation and has no desire for future childbearing.  Pertinent Gynecological History: Last pap: WNL 2014 OB History: NSVD x 1                    C/S x1   Menstrual History:  Patient's last menstrual period was 05/12/2016 (exact date).    Past Medical History:  Diagnosis Date  . Arthritis    knees  . Depression   . Diabetes mellitus without complication ([place])   . Hypertension   . Migraines   . Ulcer     Past Surgical History:  Procedure Laterality Date  . CESAREAN SECTION  1993  . TUBAL LIGATION  1993    Family History  Problem Relation Age of Onset  . Diabetes Mother   . Hypertension Mother   . Heart disease Mother   . Hyperlipidemia Mother   . Diabetes Father   . Hypertension Father   . Heart disease Father   . Hyperlipidemia Father   . Cervical cancer Sister   . Heart disease Sister   . Diabetes Sister   . Heart disease Sister   . Diabetes Sister     Social History:  reports that she has never smoked. She has never used smokeless tobacco. She reports that she drinks alcohol. She reports that she does not use drugs.  Allergies:  Allergies  Allergen Reactions  . Shellfish Allergy Anaphylaxis    Closes throat  . Codeine Itching  . Menthol Hives  . Morphine And Related Itching  . Penicillins Swelling    Has patient had a PCN reaction causing immediate rash, facial/tongue/throat swelling, SOB or lightheadedness with hypotension: Yes Has patient had a PCN reaction causing  severe rash involving mucus membranes or skin necrosis: No Has patient had a PCN reaction that required hospitalization No Has patient had a PCN reaction occurring within the last 10 years: No If all of the above answers are "NO", then may proceed with Cephalosporin use.  [place] [Pseudoephedrine Hcl] Swelling  . Azithromycin Rash  . Erythromycin Rash    No prescriptions prior to admission.    Review of Systems  Gastrointestinal: Negative for abdominal pain.    Last menstrual period 05/12/2016. Physical Exam  Constitutional: She is oriented to person, place, and time.  Obese  Cardiovascular: Normal rate and regular rhythm.   Respiratory: Effort normal.  GI: Soft.  Genitourinary: Vagina normal.  Neurological: She is alert and oriented to person, place, and time.  Psychiatric: She has a normal mood and affect.    No results found for this or any previous visit (from the past 24 hour(s)).  No results found.  Assessment/Plan: The patient was counseled on the novasure procedure in detail. The risks of bleeding and infection and possible uterine perforation were reviewed. We discussed that the procedure will usually reduce bleeding significantly, but may not eliminate periods. We also discussed that she should not perform this procedure if she desires  any future pregnancies. She plans no future childbearing and has had a tubal ligation. We will plan in the hospital given her obesity and CHTN, DM for possible airway issues  Logan Bores 06/01/2016, 9:20 PM

## 2016-06-02 ENCOUNTER — Ambulatory Visit (HOSPITAL_COMMUNITY)
Admission: RE | Admit: 2016-06-02 | Discharge: 2016-06-02 | Disposition: A | Discharge status: Home / Self Care | Payer: BLUE CROSS/BLUE SHIELD | Source: Ambulatory Visit | Attending: Obstetrics and Gynecology | Admitting: Obstetrics and Gynecology

## 2016-06-02 ENCOUNTER — Ambulatory Visit (HOSPITAL_COMMUNITY): Payer: BLUE CROSS/BLUE SHIELD | Admitting: Anesthesiology

## 2016-06-02 ENCOUNTER — Encounter (HOSPITAL_COMMUNITY): Payer: Self-pay

## 2016-06-02 ENCOUNTER — Encounter (HOSPITAL_COMMUNITY)
Admission: RE | Disposition: A | Discharge status: Home / Self Care | Payer: Self-pay | Source: Ambulatory Visit | Attending: Obstetrics and Gynecology

## 2016-06-02 DIAGNOSIS — M1990 Unspecified osteoarthritis, unspecified site: Secondary | ICD-10-CM | POA: Insufficient documentation

## 2016-06-02 DIAGNOSIS — Z880 Allergy status to penicillin: Secondary | ICD-10-CM | POA: Insufficient documentation

## 2016-06-02 DIAGNOSIS — E119 Type 2 diabetes mellitus without complications: Secondary | ICD-10-CM | POA: Diagnosis not present

## 2016-06-02 DIAGNOSIS — Z91013 Allergy to seafood: Secondary | ICD-10-CM | POA: Insufficient documentation

## 2016-06-02 DIAGNOSIS — Z8049 Family history of malignant neoplasm of other genital organs: Secondary | ICD-10-CM | POA: Diagnosis not present

## 2016-06-02 DIAGNOSIS — Z833 Family history of diabetes mellitus: Secondary | ICD-10-CM | POA: Insufficient documentation

## 2016-06-02 DIAGNOSIS — Z885 Allergy status to narcotic agent status: Secondary | ICD-10-CM | POA: Insufficient documentation

## 2016-06-02 DIAGNOSIS — E669 Obesity, unspecified: Secondary | ICD-10-CM | POA: Diagnosis not present

## 2016-06-02 DIAGNOSIS — Z881 Allergy status to other antibiotic agents status: Secondary | ICD-10-CM | POA: Diagnosis not present

## 2016-06-02 DIAGNOSIS — Z8249 Family history of ischemic heart disease and other diseases of the circulatory system: Secondary | ICD-10-CM | POA: Diagnosis not present

## 2016-06-02 DIAGNOSIS — F329 Major depressive disorder, single episode, unspecified: Secondary | ICD-10-CM | POA: Insufficient documentation

## 2016-06-02 DIAGNOSIS — Z888 Allergy status to other drugs, medicaments and biological substances status: Secondary | ICD-10-CM | POA: Insufficient documentation

## 2016-06-02 DIAGNOSIS — I10 Essential (primary) hypertension: Secondary | ICD-10-CM | POA: Diagnosis not present

## 2016-06-02 DIAGNOSIS — N920 Excessive and frequent menstruation with regular cycle: Secondary | ICD-10-CM | POA: Diagnosis present

## 2016-06-02 DIAGNOSIS — G43909 Migraine, unspecified, not intractable, without status migrainosus: Secondary | ICD-10-CM | POA: Diagnosis not present

## 2016-06-02 DIAGNOSIS — N946 Dysmenorrhea, unspecified: Secondary | ICD-10-CM | POA: Diagnosis not present

## 2016-06-02 HISTORY — PX: HYSTEROSCOPY WITH NOVASURE: SHX5574

## 2016-06-02 LAB — GLUCOSE, CAPILLARY
GLUCOSE-CAPILLARY: 170 mg/dL — AB (ref 65–99)
Glucose-Capillary: 149 mg/dL — ABNORMAL HIGH (ref 65–99)

## 2016-06-02 SURGERY — HYSTEROSCOPY WITH NOVASURE
Anesthesia: General | Site: Vagina

## 2016-06-02 MED ORDER — FENTANYL CITRATE (PF) 100 MCG/2ML IJ SOLN
INTRAMUSCULAR | Status: AC
  Administered 2016-06-02: 50 ug via INTRAVENOUS
  Filled 2016-06-02: qty 2

## 2016-06-02 MED ORDER — MIDAZOLAM HCL 2 MG/2ML IJ SOLN
INTRAMUSCULAR | Status: AC
  Filled 2016-06-02: qty 2

## 2016-06-02 MED ORDER — IBUPROFEN 200 MG PO TABS: 600.0000 mg | ORAL_TABLET | Freq: Four times a day (QID) | ORAL | 0 refills | Status: DC | PRN

## 2016-06-02 MED ORDER — ONDANSETRON HCL 4 MG/2ML IJ SOLN
INTRAMUSCULAR | Status: AC
  Filled 2016-06-02: qty 2

## 2016-06-02 MED ORDER — KETOROLAC TROMETHAMINE 30 MG/ML IJ SOLN
INTRAMUSCULAR | Status: AC
  Filled 2016-06-02: qty 1

## 2016-06-02 MED ORDER — OXYCODONE-ACETAMINOPHEN 5-325 MG PO TABS: 1.0000 | ORAL_TABLET | ORAL | 0 refills | Status: DC | PRN

## 2016-06-02 MED ORDER — LACTATED RINGERS IV SOLN
INTRAVENOUS | Status: DC
  Administered 2016-06-02: 07:00:00 via INTRAVENOUS
  Administered 2016-06-02: 125 mL/h via INTRAVENOUS

## 2016-06-02 MED ORDER — ONDANSETRON HCL 4 MG/2ML IJ SOLN
INTRAMUSCULAR | Status: DC | PRN
  Administered 2016-06-02: 4 mg via INTRAVENOUS

## 2016-06-02 MED ORDER — LIDOCAINE HCL (CARDIAC) 20 MG/ML IV SOLN
INTRAVENOUS | Status: DC | PRN
  Administered 2016-06-02: 80 mg via INTRAVENOUS

## 2016-06-02 MED ORDER — LACTATED RINGERS IV SOLN: INTRAVENOUS | Status: DC

## 2016-06-02 MED ORDER — PROPOFOL 10 MG/ML IV BOLUS
INTRAVENOUS | Status: AC
  Filled 2016-06-02: qty 20

## 2016-06-02 MED ORDER — LIDOCAINE HCL (CARDIAC) 20 MG/ML IV SOLN
INTRAVENOUS | Status: AC
  Filled 2016-06-02: qty 5

## 2016-06-02 MED ORDER — SCOPOLAMINE 1 MG/3DAYS TD PT72
MEDICATED_PATCH | TRANSDERMAL | Status: AC
  Filled 2016-06-02: qty 1

## 2016-06-02 MED ORDER — SCOPOLAMINE 1 MG/3DAYS TD PT72
1.0000 | MEDICATED_PATCH | Freq: Once | TRANSDERMAL | Status: DC
  Administered 2016-06-02: 1.5 mg via TRANSDERMAL

## 2016-06-02 MED ORDER — LIDOCAINE HCL 1 % IJ SOLN
INTRAMUSCULAR | Status: AC
  Filled 2016-06-02: qty 20

## 2016-06-02 MED ORDER — LIDOCAINE HCL 1 % IJ SOLN
INTRAMUSCULAR | Status: DC | PRN
  Administered 2016-06-02: 20 mL

## 2016-06-02 MED ORDER — MEPERIDINE HCL 25 MG/ML IJ SOLN: 6.2500 mg | INTRAMUSCULAR | Status: DC | PRN

## 2016-06-02 MED ORDER — FENTANYL CITRATE (PF) 100 MCG/2ML IJ SOLN
25.0000 ug | INTRAMUSCULAR | Status: DC | PRN
  Administered 2016-06-02 (×3): 50 ug via INTRAVENOUS

## 2016-06-02 MED ORDER — ONDANSETRON HCL 4 MG/2ML IJ SOLN: 4.0000 mg | Freq: Once | INTRAMUSCULAR | Status: DC | PRN

## 2016-06-02 MED ORDER — KETOROLAC TROMETHAMINE 30 MG/ML IJ SOLN
INTRAMUSCULAR | Status: DC | PRN
  Administered 2016-06-02: 30 mg via INTRAVENOUS

## 2016-06-02 MED ORDER — FENTANYL CITRATE (PF) 100 MCG/2ML IJ SOLN
INTRAMUSCULAR | Status: DC | PRN
  Administered 2016-06-02 (×2): 25 ug via INTRAVENOUS
  Administered 2016-06-02: 50 ug via INTRAVENOUS

## 2016-06-02 MED ORDER — SODIUM CHLORIDE 0.9 % IR SOLN
Status: DC | PRN
  Administered 2016-06-02: 3000 mL

## 2016-06-02 MED ORDER — BUPIVACAINE HCL (PF) 0.5 % IJ SOLN
INTRAMUSCULAR | Status: AC
  Filled 2016-06-02: qty 30

## 2016-06-02 MED ORDER — PROPOFOL 10 MG/ML IV BOLUS
INTRAVENOUS | Status: DC | PRN
  Administered 2016-06-02: 200 mg via INTRAVENOUS
  Administered 2016-06-02: 50 mg via INTRAVENOUS

## 2016-06-02 MED ORDER — KETOROLAC TROMETHAMINE 30 MG/ML IJ SOLN: 30.0000 mg | Freq: Once | INTRAMUSCULAR | Status: DC | PRN

## 2016-06-02 MED ORDER — MIDAZOLAM HCL 5 MG/5ML IJ SOLN
INTRAMUSCULAR | Status: DC | PRN
  Administered 2016-06-02: 2 mg via INTRAVENOUS

## 2016-06-02 MED ORDER — ACETAMINOPHEN 325 MG PO TABS: 325.0000 mg | ORAL_TABLET | ORAL | Status: DC | PRN

## 2016-06-02 MED ORDER — FENTANYL CITRATE (PF) 100 MCG/2ML IJ SOLN
INTRAMUSCULAR | Status: AC
  Filled 2016-06-02: qty 2

## 2016-06-02 MED ORDER — DEXAMETHASONE SODIUM PHOSPHATE 10 MG/ML IJ SOLN
INTRAMUSCULAR | Status: AC
  Filled 2016-06-02: qty 1

## 2016-06-02 MED ORDER — ACETAMINOPHEN 160 MG/5ML PO SOLN: 325.0000 mg | ORAL | Status: DC | PRN

## 2016-06-02 SURGICAL SUPPLY — 17 items
ABLATOR ENDOMETRIAL BIPOLAR (ABLATOR) ×3 IMPLANT
CANISTER SUCT 3000ML PPV (MISCELLANEOUS) ×4 IMPLANT
CATH ROBINSON RED A/P 16FR (CATHETERS) ×4 IMPLANT
CLOTH BEACON ORANGE TIMEOUT ST (SAFETY) ×4 IMPLANT
CONTAINER PREFILL 10% NBF 60ML (FORM) ×8 IMPLANT
ELECT REM PT RETURN 9FT ADLT (ELECTROSURGICAL)
ELECTRODE REM PT RTRN 9FT ADLT (ELECTROSURGICAL) IMPLANT
GLOVE BIO SURGEON STRL SZ 6.5 (GLOVE) ×3 IMPLANT
GLOVE BIO SURGEONS STRL SZ 6.5 (GLOVE) ×1
GLOVE BIOGEL PI IND STRL 7.0 (GLOVE) ×2 IMPLANT
GLOVE BIOGEL PI INDICATOR 7.0 (GLOVE) ×2
GOWN STRL REUS W/TWL LRG LVL3 (GOWN DISPOSABLE) ×8 IMPLANT
PACK VAGINAL MINOR WOMEN LF (CUSTOM PROCEDURE TRAY) ×4 IMPLANT
PAD OB MATERNITY 4.3X12.25 (PERSONAL CARE ITEMS) ×4 IMPLANT
TOWEL OR 17X24 6PK STRL BLUE (TOWEL DISPOSABLE) ×8 IMPLANT
TUBING AQUILEX INFLOW (TUBING) ×4 IMPLANT
TUBING AQUILEX OUTFLOW (TUBING) ×4 IMPLANT

## 2016-06-02 NOTE — Anesthesia Postprocedure Evaluation (Addendum)
Anesthesia Post Note  Patient: Lynn Fields  Procedure(s) Performed: Procedure(s) (LRB): HYSTEROSCOPY WITH NOVASURE (N/A)  Patient location during evaluation: PACU Anesthesia Type: General Level of consciousness: awake Pain management: pain level controlled Vital Signs Assessment: post-procedure vital signs reviewed and stable Respiratory status: spontaneous breathing Cardiovascular status: stable Postop Assessment: no signs of nausea or vomiting Anesthetic complications: no        Last Vitals:  Vitals:   06/02/16 0840 06/02/16 0845  BP:    Pulse: 66   Resp: 12   Temp:  36.8 C    Last Pain:  Vitals:   06/02/16 0845  TempSrc:   PainSc: 2    Pain Goal: Patients Stated Pain Goal: 3 (06/02/16 0845)               [name]

## 2016-06-02 NOTE — Transfer of Care (Signed)
Immediate Anesthesia Transfer of Care Note  Patient: Lynn Fields  Procedure(s) Performed: Procedure(s): HYSTEROSCOPY WITH NOVASURE (N/A)  Patient Location: PACU  Anesthesia Type:General  Level of Consciousness: awake  Airway & Oxygen Therapy: Patient Spontanous Breathing and Patient connected to nasal cannula oxygen  Post-op Assessment: Report given to RN and Post -op Vital signs reviewed and stable  Post vital signs: stable  Last Vitals:  Vitals:   06/02/16 0630  BP: (!) 131/93  Pulse: 78  Resp: 20  Temp: 36.8 C    Last Pain:  Vitals:   06/02/16 0630  TempSrc: Oral  PainSc: 3       Patients Stated Pain Goal: 3 (74/82/70 [id number])  Complications: No apparent anesthesia complications

## 2016-06-02 NOTE — Progress Notes (Signed)
Patient ID: Lynn Fields, female   DOB: 03/23/1970, 46 y.o.   MRN: [id number] Per pt no changes in dictated H&P, ready to proceed.  BS 170.

## 2016-06-02 NOTE — Anesthesia Procedure Notes (Signed)
Procedure Name: LMA Insertion Date/Time: 06/02/2016 7:23 AM Performed by: Ignacia Bayley Pre-anesthesia Checklist: Patient identified Patient Re-evaluated:Patient Re-evaluated prior to inductionOxygen Delivery Method: Circle system utilized Preoxygenation: Pre-oxygenation with 100% oxygen Intubation Type: IV induction Ventilation: Mask ventilation without difficulty LMA: LMA inserted LMA Size: 4.0 Number of attempts: 1 Placement Confirmation: positive ETCO2 and breath sounds checked- equal and bilateral Dental Injury: Teeth and Oropharynx as per pre-operative assessment

## 2016-06-02 NOTE — Op Note (Signed)
Operative Note    Preoperative Diagnosis Menorrhagia Dysmenorrhea  Postoperative Diagnosis same  Procedure Novasure endometrial ablation with hysteroscopy  Surgeon Paula Compton, MD  Anesthesia LMA  Fluids: EBL minimal UOP 93ml straight cath prior to procedure IVF 1259ml Hysteroscopic deficit 364ml Power 154 (width 4.3, length 6.5) Treatment time 2min 7 sec  Findings Normal appearing uterine cavity  Specimen None  Procedure Note Patient was taken to the operating room where LMA anesthesia was obtained without difficulty. She was then prepped and draped in the normal sterile fashion in the dorsal lithotomy position. An appropriate time out was performed. A speculum was then placed within the vagina and the anterior lip of the cervix identified and injected with approximately 2 cc of 1% plain lidocaine. An additional 9 cc each was placed at 2 and 10:00 for a paracervical block. Uterus was then sounded to 10 cm and the cervical length measured at 3.5 cm.  The Pratt dilators utilized to dilate the cervix up to approximately 24. TThe hysteroscope was introduced into the cavity and the findings noted as previously stated.  The hysteroscope was then removed and the Novasure device inserted to the top of the fundus and deployed.  A cavity width of 4.3 noted.  The device was activated with a treatment time of 67 secs. The hysteroscope was then replaced and the cavity noted to have a good treatment effect with blanching and no viable endometrium apparent.  All instruments were removed from the vagina.  The tenaculum site was hemostatic.  Finally the speculum was removed from the vagina and the patient awakened and taken to the recovery room in good condition.

## 2016-06-02 NOTE — Discharge Instructions (Signed)

## 2016-06-04 ENCOUNTER — Encounter (HOSPITAL_COMMUNITY): Payer: Self-pay | Admitting: Obstetrics and Gynecology

## 2016-07-04 NOTE — Addendum Note (Signed)
Addendum  created 07/04/16 1228 by Lyn Hollingshead, MD   Sign clinical note

## 2016-08-06 ENCOUNTER — Encounter: Payer: Self-pay | Admitting: Nurse Practitioner

## 2016-08-06 ENCOUNTER — Ambulatory Visit (INDEPENDENT_AMBULATORY_CARE_PROVIDER_SITE_OTHER): Payer: BLUE CROSS/BLUE SHIELD | Admitting: Nurse Practitioner

## 2016-08-06 ENCOUNTER — Other Ambulatory Visit (INDEPENDENT_AMBULATORY_CARE_PROVIDER_SITE_OTHER): Payer: BLUE CROSS/BLUE SHIELD

## 2016-08-06 VITALS — BP 118/88 | HR 86 | Temp 98.4°F | Ht 60.25 in | Wt 303.0 lb

## 2016-08-06 DIAGNOSIS — Z0001 Encounter for general adult medical examination with abnormal findings: Secondary | ICD-10-CM

## 2016-08-06 DIAGNOSIS — Z0000 Encounter for general adult medical examination without abnormal findings: Secondary | ICD-10-CM

## 2016-08-06 DIAGNOSIS — E118 Type 2 diabetes mellitus with unspecified complications: Secondary | ICD-10-CM

## 2016-08-06 DIAGNOSIS — E1165 Type 2 diabetes mellitus with hyperglycemia: Secondary | ICD-10-CM

## 2016-08-06 DIAGNOSIS — IMO0002 Reserved for concepts with insufficient information to code with codable children: Secondary | ICD-10-CM

## 2016-08-06 DIAGNOSIS — F4323 Adjustment disorder with mixed anxiety and depressed mood: Secondary | ICD-10-CM

## 2016-08-06 DIAGNOSIS — R7989 Other specified abnormal findings of blood chemistry: Secondary | ICD-10-CM | POA: Diagnosis not present

## 2016-08-06 DIAGNOSIS — I10 Essential (primary) hypertension: Secondary | ICD-10-CM

## 2016-08-06 LAB — CBC
HEMATOCRIT: 40.9 % (ref 36.0–46.0)
HEMOGLOBIN: 14.1 g/dL (ref 12.0–15.0)
MCHC: 34.4 g/dL (ref 30.0–36.0)
MCV: 80.7 fl (ref 78.0–100.0)
Platelets: 282 10*3/uL (ref 150.0–400.0)
RBC: 5.07 Mil/uL (ref 3.87–5.11)
RDW: 14.6 % (ref 11.5–15.5)
WBC: 7 10*3/uL (ref 4.0–10.5)

## 2016-08-06 LAB — COMPREHENSIVE METABOLIC PANEL
ALBUMIN: 3.9 g/dL (ref 3.5–5.2)
ALK PHOS: 79 U/L (ref 39–117)
ALT: 11 U/L (ref 0–35)
AST: 10 U/L (ref 0–37)
BILIRUBIN TOTAL: 0.3 mg/dL (ref 0.2–1.2)
BUN: 13 mg/dL (ref 6–23)
CO2: 24 mEq/L (ref 19–32)
Calcium: 9.2 mg/dL (ref 8.4–10.5)
Chloride: 102 mEq/L (ref 96–112)
Creatinine, Ser: 0.81 mg/dL (ref 0.40–1.20)
GFR: 98.89 mL/min (ref >60.00)
Glucose, Bld: 319 mg/dL — ABNORMAL HIGH (ref 70–99)
Potassium: 3.7 mEq/L (ref 3.5–5.1)
SODIUM: 135 meq/L (ref 135–145)
TOTAL PROTEIN: 7.4 g/dL (ref 6.0–8.3)

## 2016-08-06 LAB — LIPID PANEL
Cholesterol: 266 mg/dL — ABNORMAL HIGH (ref 0–200)
HDL: 59.3 mg/dL (ref >39.00)
NONHDL: 207.11
Total CHOL/HDL Ratio: 4
Triglycerides: 203 mg/dL — ABNORMAL HIGH (ref 0.0–149.0)
VLDL: 40.6 mg/dL — AB (ref 0.0–40.0)

## 2016-08-06 LAB — MICROALBUMIN / CREATININE URINE RATIO
Creatinine,U: 146.3 mg/dL
Microalb Creat Ratio: 0.5 mg/g (ref 0.0–30.0)
Microalb, Ur: <0.7 mg/dL (ref 0.0–1.9)

## 2016-08-06 LAB — HEMOGLOBIN A1C: HEMOGLOBIN A1C: 9.2 % — AB (ref 4.6–6.5)

## 2016-08-06 LAB — TSH: TSH: 1.53 u[IU]/mL (ref 0.35–4.50)

## 2016-08-06 LAB — LDL CHOLESTEROL, DIRECT: LDL DIRECT: 193 mg/dL

## 2016-08-06 NOTE — Progress Notes (Signed)
Subjective:    Patient ID: Lynn Fields, female    DOB: [birth year], 46 y.o.   MRN: [id number]  Patient presents today for complete physical  HPI  DM: Home glucose: 70s-240s Stopped actos due to hypoglycemic episodes 12-2pm. Currently taking metformin and glipizide. Exercise: use of elliptical, stationery bike and  Low sugar diet. Eye exam:01/2016 done by [facility].  HTN: Stable BP Readings from Last 3 Encounters:  08/06/16 118/88  06/02/16 111/68  05/23/16 132/82    Immunizations: (TDAP, Hep C screen, Pneumovax, Influenza, zoster)  Health Maintenance  Topic Date Due  . Eye exam for diabetics  05/28/2015  . Pap Smear  08/28/2015  . HIV Screening  07/27/2017*  . Hemoglobin A1C  08/10/2016  . Flu Shot  08/27/2016  . Complete foot exam   08/06/2017  . Pneumococcal vaccine (2) 09/20/2019  . Tetanus Vaccine  10/02/2022  *Topic was postponed. The date shown is not the original due date.   Diet:regular.  Weight:  Wt Readings from Last 3 Encounters:  08/06/16 (!) 303 lb (137.4 kg)  05/23/16 (!) 307 lb (139.3 kg)  03/06/16 288 lb (130.6 kg)   Exercise:none.  Fall Risk: Fall Risk  03/06/2016 06/14/2012  Falls in the past year? No No   Home Safety:home with husband and daughter.  Depression/Suicide:denies Depression screen [facility] 2/9 03/06/2016 06/14/2012  Decreased Interest 0 0  Down, Depressed, Hopeless 0 0  PHQ - 2 Score 0 0   Pap Smear (every 77yrs for >21-29 without HPV, every 67yrs for >30-18yrs with HPV):appt with GYN 08/05/2016  Vision:last done 01/2016, report requested  Dental:needed, will schedule.  Advanced Directive: Advanced Directives 05/23/2016  Does Patient Have a Medical Advance Directive? No  Would patient like information on creating a medical advance directive? Yes (MAU/Ambulatory/Procedural Areas - Information given)   Sexual History (birth control, marital status, STD):married, sexually active   Medications and allergies reviewed with  patient and updated if appropriate.  Patient Active Problem List   Diagnosis Date Noted  . Adjustment disorder with mixed anxiety and depressed mood 08/07/2016  . Degenerative arthritis of knee, bilateral 02/11/2016  . Routine general medical examination at a health care facility 08/16/2015  . Arthritis of left lower extremity 10/24/2014  . Arthritis of right lower extremity 07/05/2014  . Right knee pain 04/17/2014  . Unspecified sleep apnea 06/02/2013  . Vertigo 06/02/2013  . Essential hypertension 06/14/2012  . Diabetes type 2, uncontrolled ([place]) 06/14/2012  . Obesity, morbid ([place]) 06/14/2012    Current Outpatient Prescriptions on File Prior to Visit  Medication Sig Dispense Refill  . CINNAMON PO Take 1 tablet by mouth daily.    Marland Kitchen ibuprofen (ADVIL) 200 MG tablet Take 3 tablets (600 mg total) by mouth every 6 (six) hours as needed. 30 tablet 0  . TURMERIC PO Take 1 tablet by mouth daily.     No current facility-administered medications on file prior to visit.     Past Medical History:  Diagnosis Date  . Arthritis    knees  . Depression   . Diabetes mellitus without complication ([place])   . Hypertension   . Migraines   . Ulcer     Past Surgical History:  Procedure Laterality Date  . CESAREAN SECTION  1993  . HYSTEROSCOPY WITH NOVASURE N/A 06/02/2016   Procedure: HYSTEROSCOPY WITH NOVASURE;  Surgeon: Paula Compton, MD;  Location: [facility];  Service: Gynecology;  Laterality: N/A;  . [place]    Social History  Social History  . Marital status: Married    Spouse name: N/A  . Number of children: N/A  . Years of education: 32   Occupational History  . Teacher [employer]   Social History Main Topics  . Smoking status: Never Smoker  . Smokeless tobacco: Never Used  . Alcohol use Yes     Comment: occ  . Drug use: No  . Sexual activity: Yes    Birth control/ protection: Surgical   Other Topics Concern  . None   Social History  Narrative   Regular exercise-yes   Caffeine Use-no    Family History  Problem Relation Age of Onset  . Diabetes Mother   . Hypertension Mother   . Heart disease Mother   . Hyperlipidemia Mother   . Diabetes Father   . Hypertension Father   . Heart disease Father   . Hyperlipidemia Father   . Cervical cancer Sister   . Heart disease Sister   . Diabetes Sister   . Heart disease Sister   . Diabetes Sister         Review of Systems  Constitutional: Negative for fever, malaise/fatigue and weight loss.  HENT: Negative for congestion and sore throat.   Eyes:       Negative for visual changes  Respiratory: Negative for cough and shortness of breath.   Cardiovascular: Negative for chest pain, palpitations and leg swelling.  Gastrointestinal: Negative for blood in stool, constipation, diarrhea and heartburn.  Genitourinary: Negative for dysuria, frequency and urgency.  Musculoskeletal: Negative for falls, joint pain and myalgias.  Skin: Negative for rash.  Neurological: Negative for dizziness, sensory change and headaches.  Endo/Heme/Allergies: Does not bruise/bleed easily.  Psychiatric/Behavioral: Negative for depression, substance abuse and suicidal ideas. The patient is not nervous/anxious.     Objective:   Vitals:   08/06/16 0808  BP: 118/88  Pulse: 86  Temp: 98.4 F (36.9 C)    Body mass index is 58.69 kg/m.   Physical Examination:  Physical Exam  Constitutional: She is oriented to person, place, and time and well-developed, well-nourished, and in no distress. No distress.  HENT:  Right Ear: External ear normal.  Left Ear: External ear normal.  Nose: Nose normal.  Mouth/Throat: Oropharynx is clear and moist. No oropharyngeal exudate.  Eyes: Pupils are equal, round, and reactive to light. Conjunctivae and EOM are normal. No scleral icterus.  Neck: Normal range of motion. Neck supple. No thyromegaly present.  Cardiovascular: Normal rate, normal heart sounds  and intact distal pulses.   Pulmonary/Chest: Effort normal and breath sounds normal. She exhibits no tenderness.  Abdominal: Soft. Bowel sounds are normal. She exhibits no distension. There is no tenderness.  Genitourinary:  Genitourinary Comments: Breast and pelvic exam deferred to GYN  Musculoskeletal: Normal range of motion. She exhibits no edema or tenderness.  Lymphadenopathy:    She has no cervical adenopathy.  Neurological: She is alert and oriented to person, place, and time. Gait normal.  Skin: Skin is warm and dry.  Psychiatric: Affect and judgment normal.    ASSESSMENT and PLAN:  Lynn Fields was seen today for annual exam.  Diagnoses and all orders for this visit:  Encounter for preventative adult health care exam with abnormal findings -     CBC; Future -     Lipid panel; Future -     TSH; Future -     Comprehensive metabolic panel; Future  Uncontrolled type 2 diabetes mellitus with complication, without long-term current  use of insulin (HCC) -     Hemoglobin A1c; Future -     Microalbumin / creatinine urine ratio; Future -     Amb ref to Medical Nutrition Therapy-MNT -     metFORMIN (GLUCOPHAGE) 500 MG tablet; Take 1 tablet (500 mg total) by mouth 2 (two) times daily with a meal. -     glipiZIDE (GLUCOTROL) 10 MG tablet; Take 0.5 tablets (5 mg total) by mouth 2 (two) times daily before a meal.  Essential hypertension -     Amb ref to Medical Nutrition Therapy-MNT -     losartan-hydrochlorothiazide (HYZAAR) 50-12.5 MG tablet; Take 1 tablet by mouth daily.  Obesity, morbid ([place]) -     Amb ref to Medical Nutrition Therapy-MNT  Adjustment disorder with mixed anxiety and depressed mood -     buPROPion (WELLBUTRIN XL) 150 MG 24 hr tablet; Take 1 tablet (150 mg total) by mouth daily.   No problem-specific Assessment & Plan notes found for this encounter.     Follow up: Return in about 3 months (around 11/06/2016) for DM and HTN.  Wilfred Lacy, NP

## 2016-08-06 NOTE — Patient Instructions (Addendum)
Check glucose three times a weeks (before breakfast and at bedtime) Record glucose readings and bring to next office visit.  Go to basement for blood draw and urine collection. You will be called with results.  Calorie Counting for Weight Loss Calories are units of energy. Your body needs a certain amount of calories from food to keep you going throughout the day. When you eat more calories than your body needs, your body stores the extra calories as fat. When you eat fewer calories than your body needs, your body burns fat to get the energy it needs. Calorie counting means keeping track of how many calories you eat and drink each day. Calorie counting can be helpful if you need to lose weight. If you make sure to eat fewer calories than your body needs, you should lose weight. Ask your health care provider what a healthy weight is for you. For calorie counting to work, you will need to eat the right number of calories in a day in order to lose a healthy amount of weight per week. A dietitian can help you determine how many calories you need in a day and will give you suggestions on how to reach your calorie goal.  A healthy amount of weight to lose per week is usually 1-2 lb (0.5-0.9 kg). This usually means that your daily calorie intake should be reduced by 500-750 calories.  Eating 1,200 - 1,500 calories per day can help most women lose weight.  Eating 1,500 - 1,800 calories per day can help most men lose weight.  What is my plan? My goal is to have ___1500_______ calories per day. If I have this many calories per day, I should lose around ______1____ pounds per week. What do I need to know about calorie counting? In order to meet your daily calorie goal, you will need to:  Find out how many calories are in each food you would like to eat. Try to do this before you eat.  Decide how much of the food you plan to eat.  Write down what you ate and how many calories it had. Doing this is  called keeping a food log.  To successfully lose weight, it is important to balance calorie counting with a healthy lifestyle that includes regular activity. Aim for 150 minutes of moderate exercise (such as walking) or 75 minutes of vigorous exercise (such as running) each week. Where do I find calorie information?  The number of calories in a food can be found on a Nutrition Facts label. If a food does not have a Nutrition Facts label, try to look up the calories online or ask your dietitian for help. Remember that calories are listed per serving. If you choose to have more than one serving of a food, you will have to multiply the calories per serving by the amount of servings you plan to eat. For example, the label on a package of bread might say that a serving size is 1 slice and that there are 90 calories in a serving. If you eat 1 slice, you will have eaten 90 calories. If you eat 2 slices, you will have eaten 180 calories. How do I keep a food log? Immediately after each meal, record the following information in your food log:  What you ate. Don't forget to include toppings, sauces, and other extras on the food.  How much you ate. This can be measured in cups, ounces, or number of items.  How many calories each  food and drink had.  The total number of calories in the meal.  Keep your food log near you, such as in a small notebook in your pocket, or use a mobile app or website. Some programs will calculate calories for you and show you how many calories you have left for the day to meet your goal. What are some calorie counting tips?  Use your calories on foods and drinks that will fill you up and not leave you hungry: ? Some examples of foods that fill you up are nuts and nut butters, vegetables, lean proteins, and high-fiber foods like whole grains. High-fiber foods are foods with more than 5 g fiber per serving. ? Drinks such as sodas, specialty coffee drinks, alcohol, and juices have  a lot of calories, yet do not fill you up.  Eat nutritious foods and avoid empty calories. Empty calories are calories you get from foods or beverages that do not have many vitamins or protein, such as candy, sweets, and soda. It is better to have a nutritious high-calorie food (such as an avocado) than a food with few nutrients (such as a bag of chips).  Know how many calories are in the foods you eat most often. This will help you calculate calorie counts faster.  Pay attention to calories in drinks. Low-calorie drinks include water and unsweetened drinks.  Pay attention to nutrition labels for "low fat" or "fat free" foods. These foods sometimes have the same amount of calories or more calories than the full fat versions. They also often have added sugar, starch, or salt, to make up for flavor that was removed with the fat.  Find a way of tracking calories that works for you. Get creative. Try different apps or programs if writing down calories does not work for you. What are some portion control tips?  Know how many calories are in a serving. This will help you know how many servings of a certain food you can have.  Use a measuring cup to measure serving sizes. You could also try weighing out portions on a kitchen scale. With time, you will be able to estimate serving sizes for some foods.  Take some time to put servings of different foods on your favorite plates, bowls, and cups so you know what a serving looks like.  Try not to eat straight from a bag or box. Doing this can lead to overeating. Put the amount you would like to eat in a cup or on a plate to make sure you are eating the right portion.  Use smaller plates, glasses, and bowls to prevent overeating.  Try not to multitask (for example, watch TV or use your computer) while eating. If it is time to eat, sit down at a table and enjoy your food. This will help you to know when you are full. It will also help you to be aware of what  you are eating and how much you are eating. What are tips for following this plan? Reading food labels  Check the calorie count compared to the serving size. The serving size may be smaller than what you are used to eating.  Check the source of the calories. Make sure the food you are eating is high in vitamins and protein and low in saturated and trans fats. Shopping  Read nutrition labels while you shop. This will help you make healthy decisions before you decide to purchase your food.  Make a grocery list and stick to it.  Cooking  Try to cook your favorite foods in a healthier way. For example, try baking instead of frying.  Use low-fat dairy products. Meal planning  Use more fruits and vegetables. Half of your plate should be fruits and vegetables.  Include lean proteins like poultry and fish. How do I count calories when eating out?  Ask for smaller portion sizes.  Consider sharing an entree and sides instead of getting your own entree.  If you get your own entree, eat only half. Ask for a box at the beginning of your meal and put the rest of your entree in it so you are not tempted to eat it.  If calories are listed on the menu, choose the lower calorie options.  Choose dishes that include vegetables, fruits, whole grains, low-fat dairy products, and lean protein.  Choose items that are boiled, broiled, grilled, or steamed. Stay away from items that are buttered, battered, fried, or served with cream sauce. Items labeled "crispy" are usually fried, unless stated otherwise.  Choose water, low-fat milk, unsweetened iced tea, or other drinks without added sugar. If you want an alcoholic beverage, choose a lower calorie option such as a glass of wine or light beer.  Ask for dressings, sauces, and syrups on the side. These are usually high in calories, so you should limit the amount you eat.  If you want a salad, choose a garden salad and ask for grilled meats. Avoid extra  toppings like bacon, cheese, or fried items. Ask for the dressing on the side, or ask for olive oil and vinegar or lemon to use as dressing.  Estimate how many servings of a food you are given. For example, a serving of cooked rice is  cup or about the size of half a baseball. Knowing serving sizes will help you be aware of how much food you are eating at restaurants. The list below tells you how big or small some common portion sizes are based on everyday objects: ? 1 oz-4 stacked dice. ? 3 oz-1 deck of cards. ? 1 tsp-1 die. ? 1 Tbsp- a ping-pong ball. ? 2 Tbsp-1 ping-pong ball. ?  cup- baseball. ? 1 cup-1 baseball. Summary  Calorie counting means keeping track of how many calories you eat and drink each day. If you eat fewer calories than your body needs, you should lose weight.  A healthy amount of weight to lose per week is usually 1-2 lb (0.5-0.9 kg). This usually means reducing your daily calorie intake by 500-750 calories.  The number of calories in a food can be found on a Nutrition Facts label. If a food does not have a Nutrition Facts label, try to look up the calories online or ask your dietitian for help.  Use your calories on foods and drinks that will fill you up, and not on foods and drinks that will leave you hungry.  Use smaller plates, glasses, and bowls to prevent overeating. This information is not intended to replace advice given to you by your health care provider. Make sure you discuss any questions you have with your health care provider. Document Released: 01/13/2005 Document Revised: 12/14/2015 Document Reviewed: 12/14/2015 Elsevier Interactive Patient Education  2017 [employer].   Diabetes Mellitus and Exercise Exercising regularly is important for your overall health, especially when you have diabetes (diabetes mellitus). Exercising is not only about losing weight. It has many health benefits, such as increasing muscle strength and bone density and  reducing body fat and stress. This  leads to improved fitness, flexibility, and endurance, all of which result in better overall health. Exercise has additional benefits for people with diabetes, including:  Reducing appetite.  Helping to lower and control blood glucose.  Lowering blood pressure.  Helping to control amounts of fatty substances (lipids) in the blood, such as cholesterol and triglycerides.  Helping the body to respond better to insulin (improving insulin sensitivity).  Reducing how much insulin the body needs.  Decreasing the risk for heart disease by: ? Lowering cholesterol and triglyceride levels. ? Increasing the levels of good cholesterol. ? Lowering blood glucose levels.  What is my activity plan? Your health care provider or certified diabetes educator can help you make a plan for the type and frequency of exercise (activity plan) that works for you. Make sure that you:  Do at least 150 minutes of moderate-intensity or vigorous-intensity exercise each week. This could be brisk walking, biking, or water aerobics. ? Do stretching and strength exercises, such as yoga or weightlifting, at least 2 times a week. ? Spread out your activity over at least 3 days of the week.  Get some form of physical activity every day. ? Do not go more than 2 days in a row without some kind of physical activity. ? Avoid being inactive for more than 90 minutes at a time. Take frequent breaks to walk or stretch.  Choose a type of exercise or activity that you enjoy, and set realistic goals.  Start slowly, and gradually increase the intensity of your exercise over time.  What do I need to know about managing my diabetes?  Check your blood glucose before and after exercising. ? If your blood glucose is higher than 240 mg/dL (13.3 mmol/L) before you exercise, check your urine for ketones. If you have ketones in your urine, do not exercise until your blood glucose returns to  normal.  Know the symptoms of low blood glucose (hypoglycemia) and how to treat it. Your risk for hypoglycemia increases during and after exercise. Common symptoms of hypoglycemia can include: ? Hunger. ? Anxiety. ? Sweating and feeling clammy. ? Confusion. ? Dizziness or feeling light-headed. ? Increased heart rate or palpitations. ? Blurry vision. ? Tingling or numbness around the mouth, lips, or tongue. ? Tremors or shakes. ? Irritability.  Keep a rapid-acting carbohydrate snack available before, during, and after exercise to help prevent or treat hypoglycemia.  Avoid injecting insulin into areas of the body that are going to be exercised. For example, avoid injecting insulin into: ? The arms, when playing tennis. ? The legs, when jogging.  Keep records of your exercise habits. Doing this can help you and your health care provider adjust your diabetes management plan as needed. Write down: ? Food that you eat before and after you exercise. ? Blood glucose levels before and after you exercise. ? The type and amount of exercise you have done. ? When your insulin is expected to peak, if you use insulin. Avoid exercising at times when your insulin is peaking.  When you start a new exercise or activity, work with your health care provider to make sure the activity is safe for you, and to adjust your insulin, medicines, or food intake as needed.  Drink plenty of water while you exercise to prevent dehydration or heat stroke. Drink enough fluid to keep your urine clear or pale yellow. This information is not intended to replace advice given to you by your health care provider. Make sure you discuss any questions  you have with your health care provider. Document Released: 04/05/2003 Document Revised: 08/03/2015 Document Reviewed: 06/25/2015 Elsevier Interactive Patient Education  2018 [employer].

## 2016-08-07 ENCOUNTER — Encounter: Payer: Self-pay | Admitting: Nurse Practitioner

## 2016-08-07 DIAGNOSIS — E782 Mixed hyperlipidemia: Secondary | ICD-10-CM

## 2016-08-07 DIAGNOSIS — F4323 Adjustment disorder with mixed anxiety and depressed mood: Secondary | ICD-10-CM | POA: Insufficient documentation

## 2016-08-07 MED ORDER — METFORMIN HCL 500 MG PO TABS: 500.0000 mg | ORAL_TABLET | Freq: Two times a day (BID) | ORAL | 1 refills | Status: DC

## 2016-08-07 MED ORDER — LOSARTAN POTASSIUM-HCTZ 50-12.5 MG PO TABS: 1.0000 | ORAL_TABLET | Freq: Every day | ORAL | 1 refills | Status: DC

## 2016-08-07 MED ORDER — BUPROPION HCL ER (XL) 150 MG PO TB24: 150.0000 mg | ORAL_TABLET | Freq: Every day | ORAL | 1 refills | Status: DC

## 2016-08-07 MED ORDER — GLIPIZIDE 10 MG PO TABS: 5.0000 mg | ORAL_TABLET | Freq: Two times a day (BID) | ORAL | 1 refills | Status: DC

## 2016-08-11 MED ORDER — ATORVASTATIN CALCIUM 40 MG PO TABS: 40.0000 mg | ORAL_TABLET | Freq: Every day | ORAL | 1 refills | Status: DC

## 2016-09-05 LAB — HM PAP SMEAR

## 2016-09-05 LAB — CBC AND DIFFERENTIAL: HEMOGLOBIN: 12.7 (ref 12.0–16.0)

## 2016-09-08 ENCOUNTER — Encounter: Payer: Self-pay | Admitting: Internal Medicine

## 2016-09-08 LAB — CHG URINALYSIS NONAUTO W/O SCOPE
Blood: NEGATIVE
Glucose: NEGATIVE
NITRITE: NEGATIVE
PROTEIN: NEGATIVE
WBC, UA: NEGATIVE

## 2016-09-08 NOTE — Progress Notes (Signed)
Abstracted and sent to scan  

## 2016-09-09 IMAGING — MR MR KNEE*R* W/O CM
4 of 6 series · 19 of 40 positions shown · non-contrast
Comparison: None.

CLINICAL DATA: Knee pain. Acute injury January 2014. Weakness.
Difficulty standing and walking.

EXAM:
MRI OF THE RIGHT KNEE WITHOUT CONTRAST
TECHNIQUE: Multiplanar, multisequence MR imaging of the knee was performed. No
intravenous contrast was administered.

[Series 3: PD fat-sat · sagittal · 4.0mm · 0.31mm/px · 7 of 22 slices shown (1 of 4)]
[im 1/22]
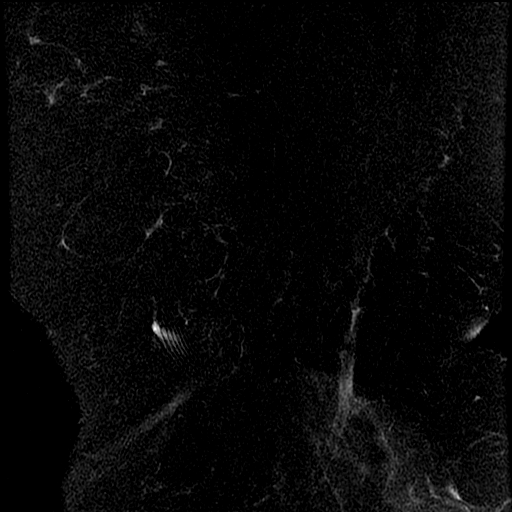
[im 4/22]
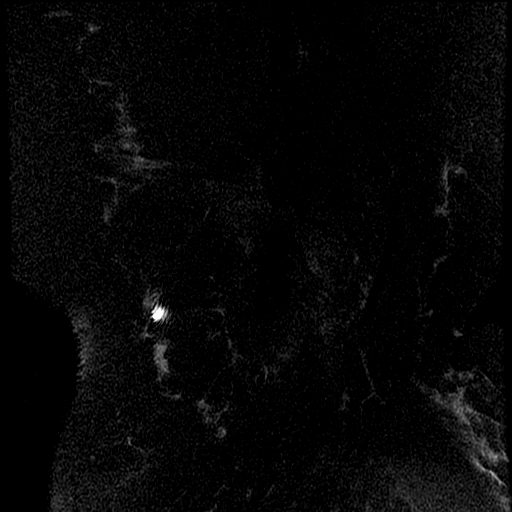
[im 8/22]
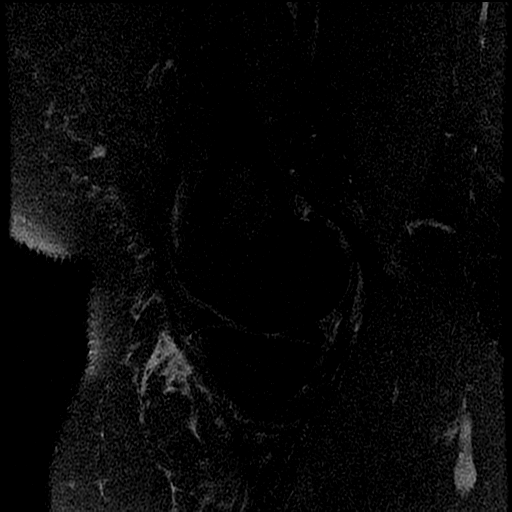
[im 11/22]
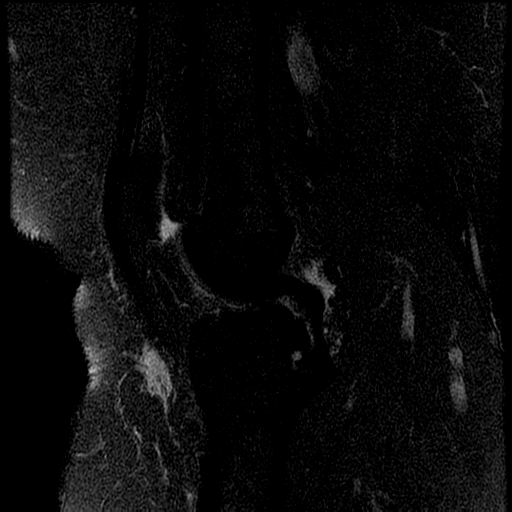
[im 15/22]
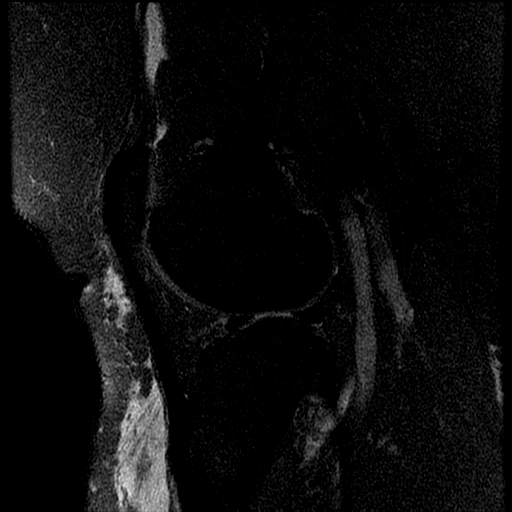
[im 18/22]
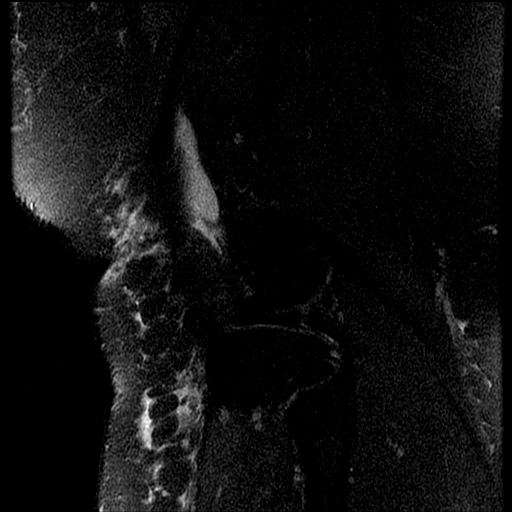
[im 22/22]
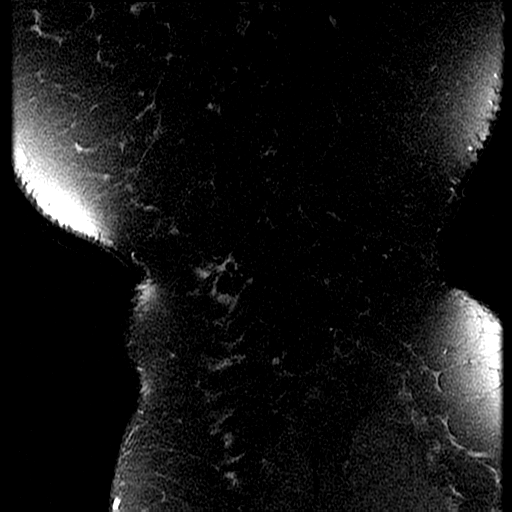

[Series 4: PD fat-sat · coronal · 4.0mm · 0.31mm/px · 6 of 20 slices shown (2 of 4)]
[im 1/20]
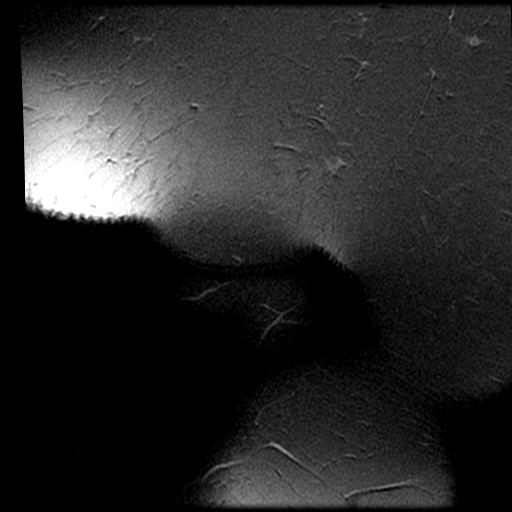
[im 4/20]
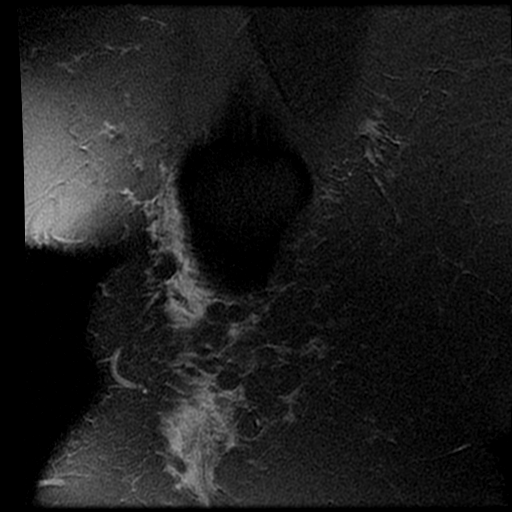
[im 7/20]
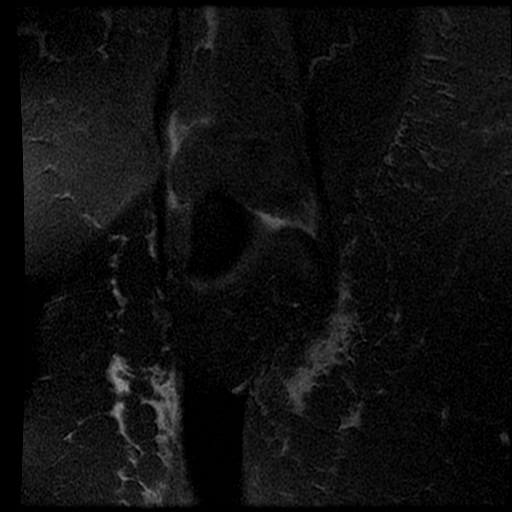
[im 10/20]
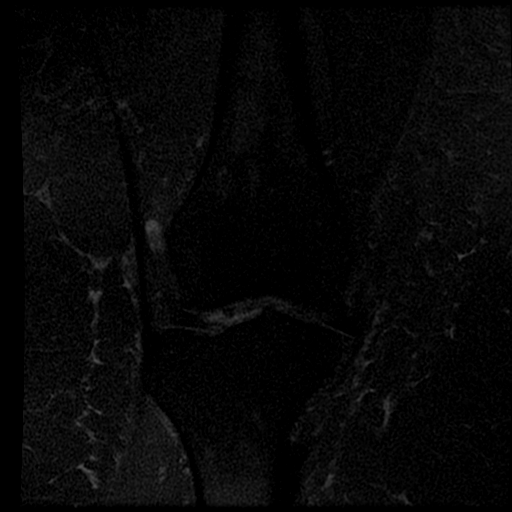
[im 13/20]
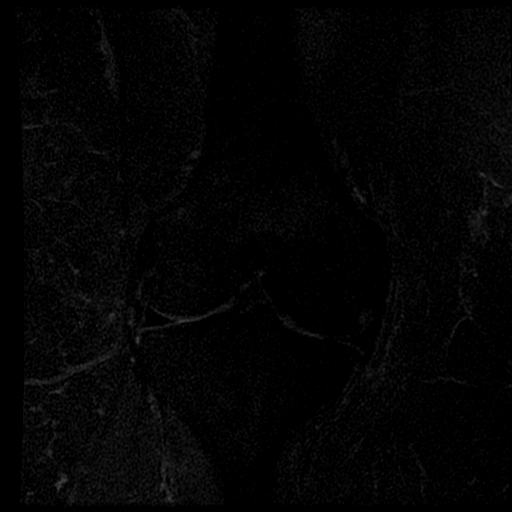
[im 16/20]
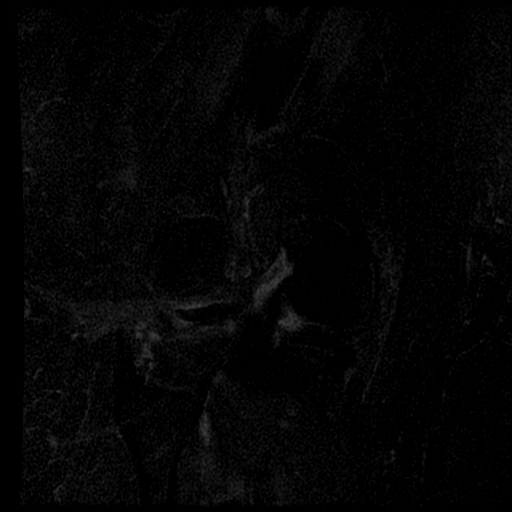

[Series 8: PD fat-sat · axial · 4.0mm · 0.37mm/px · z∈[-5,+80]mm · 3 of 25 slices shown (3 of 4)]
[im 4/25]
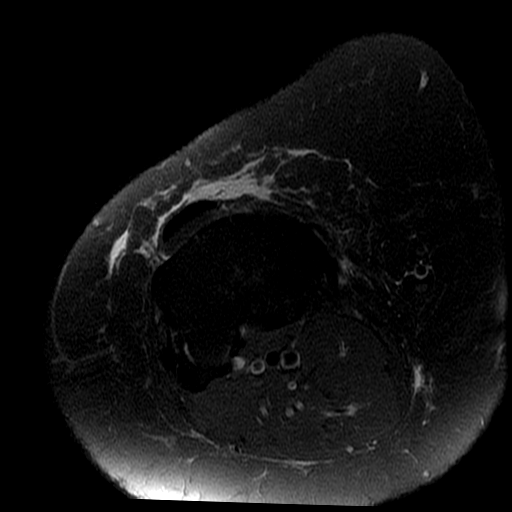
[im 14/25]
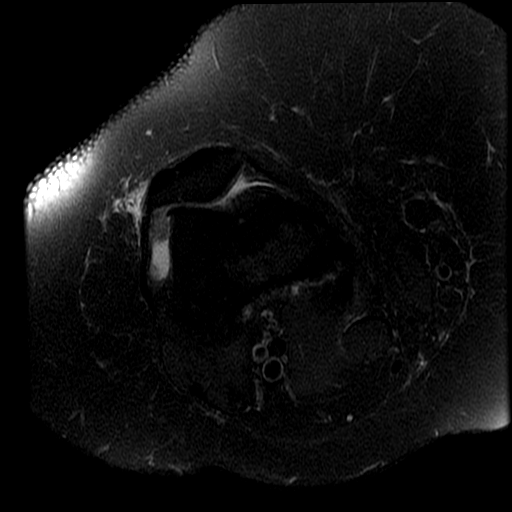
[im 21/25]
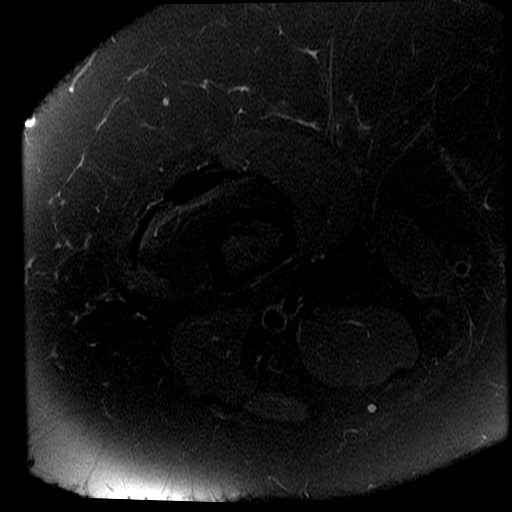

[Series 9: PD fat-sat · coronal · 2.0mm · 0.31mm/px · 3 of 12 slices shown (4 of 4)]
[im 1/12]
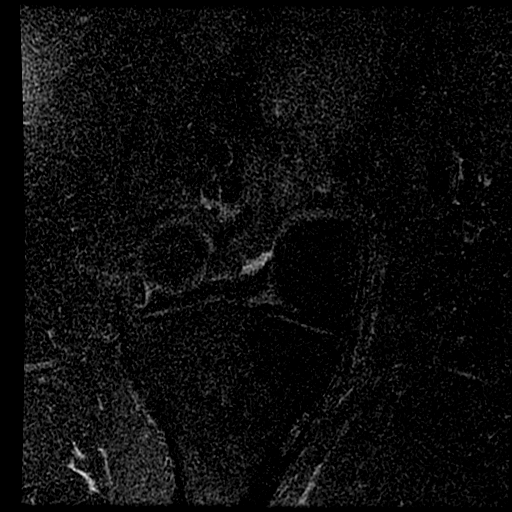
[im 8/12]
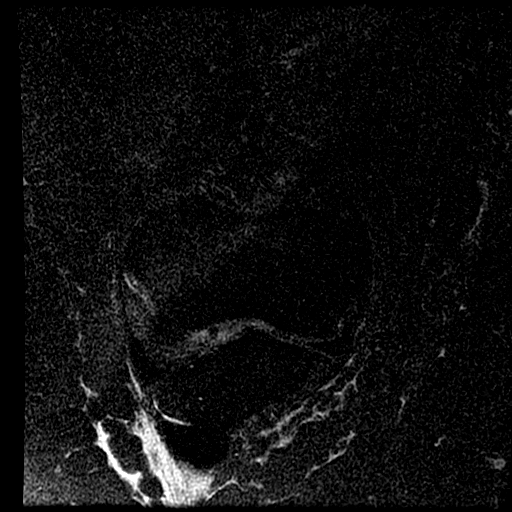
[im 12/12]
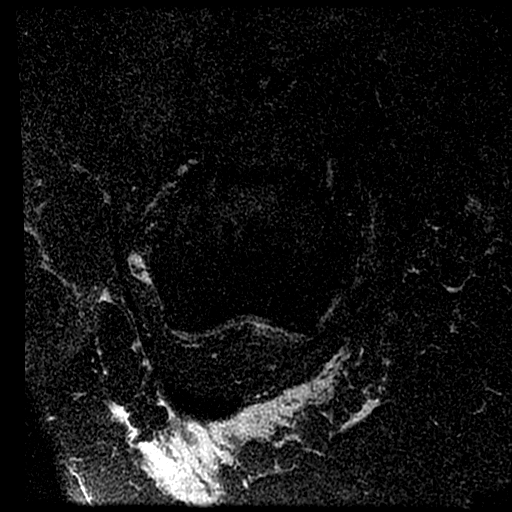

[19 of 40 positions shown; findings below may reference images not displayed]

FINDINGS: The exam is technically degraded due to nonstandard coil selection
which was necessitated by obese body habitus.

MENISCI

Medial meniscus:  Intact.

Lateral meniscus:  Intact.

LIGAMENTS

Cruciates:  Intact.

Collaterals:  Intact.

CARTILAGE

Patellofemoral: Grade II apical chondromalacia patella. Mild lateral
patellar subluxation. Trochlear cartilage appears preserved. Tiny
marginal osteophyte off the lateral trochlear.

Medial: Mild osteoarthritis. Small marginal osteophytes from the
medial femoral condyle and tibial plateau. No focal cartilaginous
defects.

Lateral:  Normal.

Joint:  Minimal effusion.

Popliteal Fossa:  Normal.

Extensor Mechanism:  Normal.

Bones:  Obesity related active red marrow.
IMPRESSION: 1. Mild patellofemoral and medial compartment osteoarthritis.
2. Intact cruciate, collateral ligaments and menisci.
3. Exam technically degraded by obese habitus.

## 2016-09-10 ENCOUNTER — Encounter: Payer: Self-pay | Admitting: Internal Medicine

## 2016-12-02 ENCOUNTER — Encounter: Payer: Self-pay | Admitting: Internal Medicine

## 2016-12-02 ENCOUNTER — Ambulatory Visit: Payer: BLUE CROSS/BLUE SHIELD | Admitting: Internal Medicine

## 2016-12-02 ENCOUNTER — Other Ambulatory Visit (INDEPENDENT_AMBULATORY_CARE_PROVIDER_SITE_OTHER): Payer: BLUE CROSS/BLUE SHIELD

## 2016-12-02 VITALS — BP 136/88 | HR 77 | Temp 98.0°F | Ht 60.25 in | Wt 312.0 lb

## 2016-12-02 DIAGNOSIS — E11649 Type 2 diabetes mellitus with hypoglycemia without coma: Secondary | ICD-10-CM

## 2016-12-02 DIAGNOSIS — R05 Cough: Secondary | ICD-10-CM

## 2016-12-02 DIAGNOSIS — R059 Cough, unspecified: Secondary | ICD-10-CM

## 2016-12-02 LAB — HEMOGLOBIN A1C: HEMOGLOBIN A1C: 6.1 % (ref 4.6–6.5)

## 2016-12-02 MED ORDER — DOXYCYCLINE HYCLATE 100 MG PO TABS: 100.0000 mg | ORAL_TABLET | Freq: Two times a day (BID) | ORAL | 0 refills | Status: DC

## 2016-12-02 NOTE — Patient Instructions (Signed)
We have sent in the doxycycline for the lungs. Take 1 pill twice a day for 1 week.   We will have you stop taking glipizide to bring the sugars back up, if the sugars are >250 call the office and we can start you back on 1/2 pill with breakfast.   We are checking the blood work today to see if the HgA1c is different from before.

## 2016-12-03 ENCOUNTER — Encounter: Payer: Self-pay | Admitting: Internal Medicine

## 2016-12-03 NOTE — Assessment & Plan Note (Signed)
Given duration and yellow sputum with lung changes and sugar changes treating with doxycycline which is prescribed today.

## 2016-12-03 NOTE — Progress Notes (Signed)
   Subjective:    Patient ID: Lynn Fields, female    DOB: 1970-09-03, 46 y.o.   MRN: [id number]  HPI The patient is a 46 YO female coming in for low blood sugars. She is taking metformin and glipizide as before. She did not make change in dosing recommended by another provider in July. She denies significant diet changes or exercise. Weight is stable from prior. She is having some low sugars to 50-60-70 at least daily. She is feeling poorly from having the low sugars with some headaches and nausea. She is eating to keep the sugars from going low. She denies diarrhea or constipation. She is sick with a cold and coughing up yellow sputum for the last week or so. Some nasal drainage as well. The low sugars started about 1 month but worse in the last 1 week.   Review of Systems  Constitutional: Positive for activity change and fatigue. Negative for appetite change, chills, fever and unexpected weight change.  HENT: Positive for congestion, postnasal drip and rhinorrhea.   Respiratory: Positive for cough and shortness of breath. Negative for chest tightness.   Cardiovascular: Negative for chest pain, palpitations and leg swelling.  Gastrointestinal: Positive for nausea. Negative for abdominal distention, abdominal pain, constipation, diarrhea and vomiting.  Endocrine: Negative for polydipsia, polyphagia and polyuria.  Musculoskeletal: Negative.   Skin: Negative.   Neurological: Positive for weakness and headaches.  Psychiatric/Behavioral: Negative.       Objective:   Physical Exam  Constitutional: She is oriented to person, place, and time. She appears well-developed and well-nourished.  HENT:  Head: Normocephalic and atraumatic.  Eyes: EOM are normal.  Neck: Normal range of motion.  Cardiovascular: Normal rate and regular rhythm.  Pulmonary/Chest: Effort normal and breath sounds normal. No respiratory distress. She has no wheezes. She has no rales.  Some rhonchi right lower lung  Abdominal:  Soft. Bowel sounds are normal. She exhibits no distension. There is no tenderness. There is no rebound.  Musculoskeletal: She exhibits no edema.  Neurological: She is alert and oriented to person, place, and time. Coordination normal.  Skin: Skin is warm and dry.  Psychiatric: She has a normal mood and affect.   Vitals:   12/02/16 1523  BP: 136/88  Pulse: 77  Temp: 98 F (36.7 C)  TempSrc: Oral  SpO2: 100%  Weight: (!) 312 lb (141.5 kg)  Height: 5' 0.25" (1.53 m)      Assessment & Plan:

## 2016-12-03 NOTE — Assessment & Plan Note (Signed)
Prior Hga1c around 9 and she claims no changes in diet or exercise. Will recheck HgA1c and change therapy as needed. Could be related to cough and cold symptoms and treating today. Hold glipizide for now and continue metformin.

## 2016-12-11 ENCOUNTER — Encounter: Payer: Self-pay | Admitting: Internal Medicine

## 2016-12-23 ENCOUNTER — Encounter: Payer: Self-pay | Admitting: Internal Medicine

## 2016-12-26 ENCOUNTER — Encounter: Payer: Self-pay | Admitting: Internal Medicine

## 2016-12-26 ENCOUNTER — Ambulatory Visit: Payer: BLUE CROSS/BLUE SHIELD | Admitting: Internal Medicine

## 2016-12-26 DIAGNOSIS — R05 Cough: Secondary | ICD-10-CM | POA: Diagnosis not present

## 2016-12-26 DIAGNOSIS — R059 Cough, unspecified: Secondary | ICD-10-CM

## 2016-12-26 MED ORDER — PROMETHAZINE-DM 6.25-15 MG/5ML PO SYRP: 5.0000 mL | ORAL_SOLUTION | Freq: Four times a day (QID) | ORAL | 0 refills | Status: DC | PRN

## 2016-12-26 MED ORDER — PREDNISONE 20 MG PO TABS: 40.0000 mg | ORAL_TABLET | Freq: Every day | ORAL | 0 refills | Status: DC

## 2016-12-26 NOTE — Progress Notes (Signed)
   Subjective:    Patient ID: Lynn Fields, female    DOB: May 11, 1970, 46 y.o.   MRN: [id number]  HPI The patient is a 46 YO female coming in for cough going on for about 3 weeks now. Given a course of doxycycline which did not help at last visit. She denies fevers or chills. Some SOB with coughing fit. Denies SOB if not coughing. Denies nasal drainage or sinus congestion. Taking otc cough medicine without relief. This is keeping her from sleeping.   Review of Systems  Constitutional: Positive for activity change, appetite change and chills. Negative for fatigue, fever and unexpected weight change.  HENT: Positive for sinus pressure. Negative for congestion, ear discharge, ear pain, postnasal drip, rhinorrhea, sinus pain, sneezing, sore throat, tinnitus, trouble swallowing and voice change.   Eyes: Negative.   Respiratory: Positive for cough and shortness of breath. Negative for chest tightness and wheezing.   Cardiovascular: Negative.   Gastrointestinal: Negative.   Neurological: Negative.   Psychiatric/Behavioral: Positive for sleep disturbance.      Objective:   Physical Exam  Constitutional: She is oriented to person, place, and time. She appears well-developed and well-nourished.  HENT:  Head: Normocephalic and atraumatic.  Oropharynx with redness and clear drainage, TMs normal bilaterally  Eyes: EOM are normal.  Neck: Normal range of motion. No thyromegaly present.  Cardiovascular: Normal rate and regular rhythm.  Pulmonary/Chest: Effort normal and breath sounds normal. No respiratory distress. She has no wheezes. She has no rales.  rhonchi from prior resolved  Abdominal: Soft.  Lymphadenopathy:    She has no cervical adenopathy.  Neurological: She is alert and oriented to person, place, and time.  Skin: Skin is warm and dry.   Vitals:   12/26/16 1457  BP: 138/88  Pulse: 69  Temp: 98.1 F (36.7 C)  TempSrc: Oral  SpO2: 99%  Weight: (!) 314 lb (142.4 kg)  Height: 5'  0.25" (1.53 m)      Assessment & Plan:

## 2016-12-26 NOTE — Patient Instructions (Signed)
We have sent in the phenergan with dextromorphan cough syrup which you can take in the evening to help with cough.  We have also sent in prednisone to help the inflammation in the lungs. Take 2 pills daily for 5 days.

## 2016-12-26 NOTE — Assessment & Plan Note (Signed)
Prior rhonchi resolved with doxycycline. Rx for prednisone and promethazine/dextromorphan cough syrup due to allergy with codeine and lack of efficacy of tessalon perles previously.

## 2017-01-24 ENCOUNTER — Other Ambulatory Visit: Payer: Self-pay | Admitting: Nurse Practitioner

## 2017-01-24 DIAGNOSIS — F4323 Adjustment disorder with mixed anxiety and depressed mood: Secondary | ICD-10-CM

## 2017-01-28 ENCOUNTER — Other Ambulatory Visit: Payer: Self-pay | Admitting: Nurse Practitioner

## 2017-01-28 DIAGNOSIS — E782 Mixed hyperlipidemia: Secondary | ICD-10-CM

## 2017-01-29 ENCOUNTER — Encounter: Payer: Self-pay | Admitting: Internal Medicine

## 2017-01-29 ENCOUNTER — Encounter (HOSPITAL_COMMUNITY): Payer: Self-pay | Admitting: Emergency Medicine

## 2017-01-29 ENCOUNTER — Ambulatory Visit: Payer: Self-pay | Admitting: *Deleted

## 2017-01-29 ENCOUNTER — Emergency Department (HOSPITAL_COMMUNITY)
Admission: EM | Admit: 2017-01-29 | Discharge: 2017-01-29 | Disposition: A | Discharge status: Home / Self Care | Payer: Managed Care, Other (non HMO) | Attending: Emergency Medicine | Admitting: Emergency Medicine

## 2017-01-29 DIAGNOSIS — F4323 Adjustment disorder with mixed anxiety and depressed mood: Secondary | ICD-10-CM | POA: Insufficient documentation

## 2017-01-29 DIAGNOSIS — Z79899 Other long term (current) drug therapy: Secondary | ICD-10-CM | POA: Diagnosis not present

## 2017-01-29 DIAGNOSIS — E119 Type 2 diabetes mellitus without complications: Secondary | ICD-10-CM | POA: Diagnosis not present

## 2017-01-29 DIAGNOSIS — Z7984 Long term (current) use of oral hypoglycemic drugs: Secondary | ICD-10-CM | POA: Insufficient documentation

## 2017-01-29 DIAGNOSIS — T781XXA Other adverse food reactions, not elsewhere classified, initial encounter: Secondary | ICD-10-CM | POA: Diagnosis not present

## 2017-01-29 DIAGNOSIS — L509 Urticaria, unspecified: Secondary | ICD-10-CM | POA: Diagnosis present

## 2017-01-29 DIAGNOSIS — I10 Essential (primary) hypertension: Secondary | ICD-10-CM | POA: Diagnosis not present

## 2017-01-29 DIAGNOSIS — F329 Major depressive disorder, single episode, unspecified: Secondary | ICD-10-CM | POA: Diagnosis not present

## 2017-01-29 DIAGNOSIS — R110 Nausea: Secondary | ICD-10-CM | POA: Diagnosis not present

## 2017-01-29 DIAGNOSIS — T7840XA Allergy, unspecified, initial encounter: Secondary | ICD-10-CM

## 2017-01-29 MED ORDER — EPINEPHRINE 0.3 MG/0.3ML IJ SOAJ: 0.3000 mg | Freq: Once | INTRAMUSCULAR | 2 refills | Status: AC

## 2017-01-29 MED ORDER — FAMOTIDINE IN NACL 20-0.9 MG/50ML-% IV SOLN
20.0000 mg | Freq: Once | INTRAVENOUS | Status: AC
  Administered 2017-01-29: 20 mg via INTRAVENOUS
  Filled 2017-01-29: qty 50

## 2017-01-29 MED ORDER — PREDNISONE 20 MG PO TABS: ORAL_TABLET | ORAL | 0 refills | Status: DC

## 2017-01-29 MED ORDER — METHYLPREDNISOLONE SODIUM SUCC 125 MG IJ SOLR
125.0000 mg | Freq: Once | INTRAMUSCULAR | Status: AC
  Administered 2017-01-29: 125 mg via INTRAVENOUS
  Filled 2017-01-29: qty 2

## 2017-01-29 MED ORDER — DIPHENHYDRAMINE HCL 50 MG/ML IJ SOLN
25.0000 mg | Freq: Once | INTRAMUSCULAR | Status: AC
  Administered 2017-01-29: 25 mg via INTRAVENOUS
  Filled 2017-01-29: qty 1

## 2017-01-29 MED ORDER — ONDANSETRON 4 MG PO TBDP: 4.0000 mg | ORAL_TABLET | Freq: Three times a day (TID) | ORAL | 0 refills | Status: DC | PRN

## 2017-01-29 MED ORDER — ONDANSETRON HCL 4 MG/2ML IJ SOLN
4.0000 mg | Freq: Once | INTRAMUSCULAR | Status: AC
  Administered 2017-01-29: 4 mg via INTRAVENOUS
  Filled 2017-01-29: qty 2

## 2017-01-29 MED ORDER — RANITIDINE HCL 150 MG PO TABS: 150.0000 mg | ORAL_TABLET | Freq: Two times a day (BID) | ORAL | 0 refills | Status: DC

## 2017-01-29 MED ORDER — SODIUM CHLORIDE 0.9 % IV BOLUS (SEPSIS)
1000.0000 mL | Freq: Once | INTRAVENOUS | Status: AC
  Administered 2017-01-29: 1000 mL via INTRAVENOUS

## 2017-01-29 MED ORDER — DIPHENHYDRAMINE HCL 25 MG PO TABS: 25.0000 mg | ORAL_TABLET | ORAL | 0 refills | Status: DC | PRN

## 2017-01-29 NOTE — ED Triage Notes (Signed)
Patient reports that last night patient's son fixed shrimp in house and when she got home started itching so took big gulp of Benadryl.  Patient went to bed then today went on to work but itching still persisting and feels like throat is closing up.  Patient denies taking any Benadryl today.  Patient able to speak in full sentences.

## 2017-01-29 NOTE — ED Notes (Signed)
Pt reports an allergic reaction after eating seafood last night. Pt reports pain in her throat and itching in her head. No acute distress noted, no visible hives noted.

## 2017-01-29 NOTE — Telephone Encounter (Signed)
Patient went to ED

## 2017-01-29 NOTE — ED Provider Notes (Signed)
[facility] DEPT Provider Note   CSN: [id number] Arrival date & time: 01/29/17  1348     History   Chief Complaint Chief Complaint  Patient presents with  . Allergic Reaction    HPI Lynn Fields is a 47 y.o. female with a PMHx of depression, DM2, HTN, migraines, and other condtions listed below, who presents to the ED with complaints of allergic reaction to shrimp.  Patient states that she knows she is allergic to shellfish, but her son was cooking shrimp last night in the house and she came home after it had been cooked.  She developed itchy generalized hives and used Benadryl last night which helped, and she went to sleep.  When she awoke this morning she continued to have itchy hives around her neck and hairline, and noticed that her voice was hoarse, she was having throat tightness, dry cough, and nausea.  She has not taken anything today for her symptoms, the only aggravating factor was shrimp exposure.  She did not actually eat the shrimp or come into direct contact with it, it was just cooked and had diffused into the air.  Patient denies any fevers, chills, facial swelling, drooling, trismus, difficulty swallowing or breathing, wheezing, chest pain, shortness of breath, abdominal pain, vomiting, diarrhea, constipation, dysuria, hematuria, myalgias, arthralgias, numbness, tingling, focal weakness, or any other complaints at this time.   The history is provided by the patient and medical records. No language interpreter was used.  Allergic Reaction  Presenting symptoms: itching and rash   Presenting symptoms: no difficulty swallowing and no wheezing   Severity:  Mild Duration:  1 day Prior allergic episodes:  Food/nut allergies Context: food   Relieved by:  Antihistamines Exacerbated by: shrimp exposure. Ineffective treatments:  None tried   Past Medical History:  Diagnosis Date  . Arthritis    knees  . Depression   . Diabetes mellitus without  complication ([place])   . Hypertension   . Migraines   . Ulcer     Patient Active Problem List   Diagnosis Date Noted  . Adjustment disorder with mixed anxiety and depressed mood 08/07/2016  . Degenerative arthritis of knee, bilateral 02/11/2016  . Routine general medical examination at a health care facility 08/16/2015  . Cough 03/24/2015  . Arthritis of left lower extremity 10/24/2014  . Arthritis of right lower extremity 07/05/2014  . Right knee pain 04/17/2014  . Unspecified sleep apnea 06/02/2013  . Vertigo 06/02/2013  . Essential hypertension 06/14/2012  . Diabetes type 2, uncontrolled ([place]) 06/14/2012  . Obesity, morbid ([place]) 06/14/2012    Past Surgical History:  Procedure Laterality Date  . CESAREAN SECTION  1993  . HYSTEROSCOPY WITH NOVASURE N/A 06/02/2016   Procedure: HYSTEROSCOPY WITH NOVASURE;  Surgeon: Paula Compton, MD;  Location: [facility];  Service: Gynecology;  Laterality: N/A;  . TUBAL LIGATION  1993    OB History    No data available       Home Medications    Prior to Admission medications   Medication Sig Start Date End Date Taking? Authorizing Provider  atorvastatin (LIPITOR) 40 MG tablet TAKE 1 TABLET BY MOUTH ONCE DAILY AT  6  PM 01/29/17   Hoyt Koch, MD  buPROPion (WELLBUTRIN XL) 150 MG 24 hr tablet Take 1 tablet (150 mg total) by mouth daily. 08/07/16   Nche, Charlene Brooke, NP  CINNAMON PO Take 1 tablet by mouth daily.    [provider]  ibuprofen (ADVIL) 200 MG  tablet Take 3 tablets (600 mg total) by mouth every 6 (six) hours as needed. 06/02/16   Paula Compton, MD  losartan-hydrochlorothiazide (HYZAAR) 50-12.5 MG tablet Take 1 tablet by mouth daily. 08/07/16   Nche, Charlene Brooke, NP  metFORMIN (GLUCOPHAGE) 500 MG tablet Take 1 tablet (500 mg total) by mouth 2 (two) times daily with a meal. 08/07/16   Nche, Charlene Brooke, NP  predniSONE (DELTASONE) 20 MG tablet Take 2 tablets (40 mg total) by mouth daily with breakfast. 12/26/16    Hoyt Koch, MD  promethazine-dextromethorphan (PROMETHAZINE-DM) 6.25-15 MG/5ML syrup Take 5 mLs by mouth 4 (four) times daily as needed for cough. 12/26/16   Hoyt Koch, MD  TURMERIC PO Take 1 tablet by mouth daily.    [provider]    Family History Family History  Problem Relation Age of Onset  . Diabetes Mother   . Hypertension Mother   . Heart disease Mother   . Hyperlipidemia Mother   . Diabetes Father   . Hypertension Father   . Heart disease Father   . Hyperlipidemia Father   . Cervical cancer Sister   . Heart disease Sister   . Diabetes Sister   . Heart disease Sister   . Diabetes Sister     Social History Social History   Tobacco Use  . Smoking status: Never Smoker  . Smokeless tobacco: Never Used  Substance Use Topics  . Alcohol use: Yes    Comment: occ  . Drug use: No     Allergies   Shellfish allergy; Codeine; Menthol; Morphine and related; Penicillins; Sudafed [pseudoephedrine hcl]; Azithromycin; and Erythromycin   Review of Systems Review of Systems  Constitutional: Negative for chills and fever.  HENT: Positive for sore throat (tightness) and voice change. Negative for drooling, facial swelling and trouble swallowing.   Respiratory: Positive for cough. Negative for shortness of breath and wheezing.   Cardiovascular: Negative for chest pain.  Gastrointestinal: Positive for nausea. Negative for abdominal pain, constipation, diarrhea and vomiting.  Genitourinary: Negative for dysuria and hematuria.  Musculoskeletal: Negative for arthralgias and myalgias.  Skin: Positive for itching and rash.  Allergic/Immunologic: Positive for immunocompromised state (DM2).  Neurological: Negative for weakness and numbness.  Psychiatric/Behavioral: Negative for confusion.   All other systems reviewed and are negative for acute change except as noted in the HPI.    Physical Exam Updated Vital Signs BP (!) 143/105 (BP Location: Left  Arm)   Pulse 100   Temp 98.6 F (37 C) (Oral)   Resp 19   SpO2 100%   Physical Exam  Constitutional: She is oriented to person, place, and time. Vital signs are normal. She appears well-developed and well-nourished.  Non-toxic appearance. No distress.  Afebrile, nontoxic, NAD  HENT:  Head: Normocephalic and atraumatic.  Nose: Nose normal.  Mouth/Throat: Uvula is midline and mucous membranes are normal. No trismus in the jaw. No uvula swelling. Posterior oropharyngeal erythema present. No oropharyngeal exudate, posterior oropharyngeal edema or tonsillar abscesses. Tonsils are 0 on the right. Tonsils are 0 on the left. No tonsillar exudate.  No facial or oral swelling. Nose clear. Oropharynx mildly injected, without uvular swelling or deviation, no trismus or drooling, no tonsillar swelling or erythema, no exudates. Handling secretions well. Airway patent.    Eyes: Conjunctivae and EOM are normal. Right eye exhibits no discharge. Left eye exhibits no discharge.  Neck: Normal range of motion. Neck supple.  Cardiovascular: Normal rate, regular rhythm, normal heart sounds and intact distal  pulses. Exam reveals no gallop and no friction rub.  No murmur heard. Pulmonary/Chest: Effort normal and breath sounds normal. No respiratory distress. She has no decreased breath sounds. She has no wheezes. She has no rhonchi. She has no rales.  CTAB in all lung fields, no w/r/r, no hypoxia or increased WOB, speaking in full sentences, SpO2 100% on RA   Abdominal: Soft. Normal appearance and bowel sounds are normal. She exhibits no distension. There is no tenderness. There is no rigidity, no rebound, no guarding, no CVA tenderness, no tenderness at McBurney's point and negative Murphy's sign.  Musculoskeletal: Normal range of motion.  Neurological: She is alert and oriented to person, place, and time. She has normal strength. No sensory deficit.  Skin: Skin is warm, dry and intact. Rash noted. Rash is  urticarial.  Small urticarial hives to the neckline and hairline  Psychiatric: She has a normal mood and affect.  Nursing note and vitals reviewed.    ED Treatments / Results  Labs (all labs ordered are listed, but only abnormal results are displayed) Labs Reviewed - No data to display  EKG  EKG Interpretation None       Radiology No results found.  Procedures Procedures (including critical care time)  Medications Ordered in ED Medications  sodium chloride 0.9 % bolus 1,000 mL (1,000 mLs Intravenous New Bag/Given 01/29/17 1501)  ondansetron (ZOFRAN) injection 4 mg (4 mg Intravenous Given 01/29/17 1500)  famotidine (PEPCID) IVPB 20 mg premix (20 mg Intravenous New Bag/Given 01/29/17 1503)  methylPREDNISolone sodium succinate (SOLU-MEDROL) 125 mg/2 mL injection 125 mg (125 mg Intravenous Given 01/29/17 1502)  diphenhydrAMINE (BENADRYL) injection 25 mg (25 mg Intravenous Given 01/29/17 1500)     Initial Impression / Assessment and Plan / ED Course  I have reviewed the triage vital signs and the nursing notes.  Pertinent labs & imaging results that were available during my care of the patient were reviewed by me and considered in my medical decision making (see chart for details).     47 y.o. female here with allergic reaction to shrimp being cooked in the house last night. Having hives, itching, voice hoarseness and throat tightness, dry cough, and nausea. Took benadryl last night which helped, but this morning hadn't taken anything. On exam, mildly injected oropharynx, no trismus or drooling, airway patent, no wheezing, some hives noted around neck and hairline. No facial swelling. Will give steroids, benadryl, pepcid, and zofran, then reassess shortly.   5:05 PM Pt feeling better, symptoms starting to improve, no worsening of symptoms. No anaphylaxis noted at this time. Will send home with benadryl/zantac and prednisone x3 days starting tomorrow, and epipen. Discussed proper use of  this, and to seek medical attention if requiring this. Advised avoidance of triggers. Rx zofran also given. F/up with PCP in 3-5 days for recheck of symptoms. I explained the diagnosis and have given explicit precautions to return to the ER including for any other new or worsening symptoms. The patient understands and accepts the medical plan as it's been dictated and I have answered their questions. Discharge instructions concerning home care and prescriptions have been given. The patient is STABLE and is discharged to home in good condition.    Final Clinical Impressions(s) / ED Diagnoses   Final diagnoses:  Allergic reaction, initial encounter  Allergic reaction to food, initial encounter  Nausea    ED Discharge Orders        Ordered    diphenhydrAMINE (BENADRYL) 25 MG tablet  Every 4 hours PRN     01/29/17 1625    ranitidine (ZANTAC) 150 MG tablet  2 times daily     01/29/17 1625    predniSONE (DELTASONE) 20 MG tablet     01/29/17 1625    EPINEPHrine 0.3 mg/0.3 mL IJ SOAJ injection   Once     01/29/17 1625    ondansetron (ZOFRAN ODT) 4 MG disintegrating tablet  Every 8 hours PRN     01/29/17 [address] [place], PA-C 01/29/17 1706    Davonna Belling, MD 01/30/17 1449

## 2017-01-29 NOTE — Telephone Encounter (Signed)
Pt called stating she was exposed to shrimp last night and she is still scratching to the point she has scabs and hives; she states that she took benadryl immediately but she continues to itch and now it "feels like bugs are crawling on me", her heart rate went to 118 last night, and now her throat is sore, scratchy, and thick when she swallows; per nurse triage pt instructed to call 911 but she states that her husband will take her; will route to [facility] for notification of this encounter.   Reason for Disposition . Difficulty swallowing, drooling or slurred speech  Answer Assessment - Initial Assessment Questions 1. DESCRIPTION: "Describe the itching you are having."     All over, "feels like bugs are crawling on me" 2. SEVERITY: "How bad is it?"    - MILD - doesn't interfere with normal activities   - MODERATE-SEVERE: interferes with work, school, sleep, or other activities      sever 3. SCRATCHING: "Are there any scratch marks? Bleeding?"     no 4. ONSET: "When did this begin?"      Last night about 2100 5. CAUSE: "What do you think is causing the itching?" (ask about swimming pools, pollen, animals, soaps, etc.)     Exposed to shrimp; shellfish allergy 6. OTHER SYMPTOMS: "Do you have any other symptoms?"      Hives, throat sore and scratchy, "thick when I swallow" heart beating real fast 7. PREGNANCY: "Is there any chance you are pregnant?" "When was your last menstrual period?"     No ablation  Answer Assessment - Initial Assessment Questions 1. MAIN SYMPTOM: "What is your main symptom?" "How bad is it?"     itching 2. RESPIRATORY STATUS: "Are you having difficulty breathing?"  (e.g., yes/no, wheezing, unable to complete a sentence)      no 3. SWALLOWING: "Can you swallow?" (e.g., yes/no, food, fluid, saliva)      Yes throat "sore, scratchy, and thick when I swallow, and heart rate went up to 118 last night 4. VASCULAR STATUS: "Are you feeling weak?" If so, ask: "Can you stand and  walk normally?"     yes 5. ONSET: "When did the reaction start?" (Minutes or hours ago)      last night about 2100 6. SUBSTANCE: "What are you reacting to?" "When did the contact occur?"      shrimp 7. PREVIOUS REACTION: "Have you ever reacted to it before?" If so, ask: "What happened that time?"     yes 8. EPINEPHRINE: "Do you have an epinephrine autoinjector (e.g., Epi-pen or Twinject)?"  no  Protocols used: [facility]

## 2017-01-29 NOTE — Discharge Instructions (Signed)
Take Benadryl, zantac, and prednisone as prescribed, starting the prednisone tomorrow since you received your dose of steroids in the ER today. Use epipen for any anaphylactic reactions, and seek immediate emergent medical attention if you have to use that medication. Continue your usual home medications. Get plenty of rest and drink plenty of fluids. Use zofran as directed as needed for nausea. Avoid any known triggers. Please followup with your primary doctor in 3-5 days for recheck of symptoms. Return to the ER for changes or worsening symptoms

## 2017-02-03 ENCOUNTER — Encounter: Payer: Self-pay | Admitting: Internal Medicine

## 2017-02-03 ENCOUNTER — Other Ambulatory Visit: Payer: Self-pay | Admitting: Nurse Practitioner

## 2017-02-03 ENCOUNTER — Ambulatory Visit: Payer: Managed Care, Other (non HMO) | Admitting: Internal Medicine

## 2017-02-03 DIAGNOSIS — T781XXA Other adverse food reactions, not elsewhere classified, initial encounter: Secondary | ICD-10-CM | POA: Diagnosis not present

## 2017-02-03 DIAGNOSIS — E1165 Type 2 diabetes mellitus with hyperglycemia: Secondary | ICD-10-CM | POA: Diagnosis not present

## 2017-02-03 DIAGNOSIS — F4323 Adjustment disorder with mixed anxiety and depressed mood: Secondary | ICD-10-CM

## 2017-02-03 NOTE — Patient Instructions (Signed)
Be very careful about the shellfish and think about getting an alert bracelet for the allergy.   Keep taking the extra water and glipizide.

## 2017-02-04 ENCOUNTER — Encounter: Payer: Self-pay | Admitting: Internal Medicine

## 2017-02-04 DIAGNOSIS — T781XXA Other adverse food reactions, not elsewhere classified, initial encounter: Secondary | ICD-10-CM | POA: Insufficient documentation

## 2017-02-04 NOTE — Assessment & Plan Note (Signed)
Previously well controlled with diet. Recent steroid treatment has given her hyperglycemia. Offered to start on temporary insulin to bring down her levels to help her feel better as some of her symptoms are likely from hyperglycemia but she declines. She will taking glipizide and metformin until sugars still to decline then call for instructions to stop glipizide and metformin. Encouraged good hydration with water and low carb diet strict for the next 1-2 months.

## 2017-02-04 NOTE — Progress Notes (Signed)
   Subjective:    Patient ID: Lynn Fields, female    DOB: [birth year], 47 y.o.   MRN: [id number]  HPI The patient is a 47 YO female coming in for ER follow up (in for allergic reaction from inhalation of shrimp odors from sauteing shrimp in her house. She was not in the room they were being cooked in but the odor was strong. She had hives starting rapidly and she took benadryl. This made her very sleepy so she did not take this again the next morning. The next day she starting getting swelling in her throat and more hives all over her body due to residual odor. She did go to ER and given epi injection, benadryl with some resolution of her swelling in the ER. The hives and itching took several more days to go away. She was discharged with pepcid, claritin, and prednisone. She has been taking this and finished prednisone about 1-2 days ago. She has had very high sugars on the prednisone and went back to taking metformin and glipizide (which she had been off of due to good control) and drinking lots of water but sugars are still in the 300s to 400s when she checks. Coming down some and now 300s this morning. Little appetite and she is watching diet closely. She is very tired as well and does not feel well. Hives and itching is all gone now. No throat swelling. She will be very careful about shellfish odors in the future and never had shrimp or shellfish in the house for many years before this episode. She has epi-pen with her.   Review of Systems  Constitutional: Positive for activity change, appetite change and fatigue.  HENT: Negative.   Eyes: Negative.   Respiratory: Negative for cough, chest tightness and shortness of breath.   Cardiovascular: Negative for chest pain, palpitations and leg swelling.  Gastrointestinal: Positive for abdominal pain and nausea. Negative for abdominal distention, constipation, diarrhea and vomiting.  Endocrine: Positive for polydipsia and polyuria.  Musculoskeletal:  Negative.   Skin: Negative.   Neurological: Negative.   Psychiatric/Behavioral: Negative.       Objective:   Physical Exam  Constitutional: She is oriented to person, place, and time. She appears well-developed and well-nourished.  HENT:  Head: Normocephalic and atraumatic.  Eyes: EOM are normal.  Neck: Normal range of motion.  Cardiovascular: Normal rate and regular rhythm.  Pulmonary/Chest: Effort normal and breath sounds normal. No respiratory distress. She has no wheezes. She has no rales.  Abdominal: Soft. Bowel sounds are normal. She exhibits no distension. There is no tenderness. There is no rebound.  Musculoskeletal: She exhibits no edema.  Neurological: She is alert and oriented to person, place, and time. Coordination normal.  Skin: Skin is warm and dry.  Skin without tenting  Psychiatric: She has a normal mood and affect.   Vitals:   02/03/17 1517  BP: 110/80  Pulse: 99  Temp: 97.9 F (36.6 C)  TempSrc: Oral  SpO2: 98%  Weight: 299 lb (135.6 kg)  Height: 5' 0.25" (1.53 m)      Assessment & Plan:

## 2017-02-04 NOTE — Assessment & Plan Note (Signed)
We talked about the serious nature of her allergy. 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 She will continue to avoid ingestion of all shellfish and also avoid cooking this in her home or places where it is being cooked. She will see allergy specialist per ED referral. Has epi-pen.

## 2017-02-10 ENCOUNTER — Encounter (HOSPITAL_COMMUNITY): Payer: Self-pay | Admitting: Obstetrics and Gynecology

## 2017-02-28 ENCOUNTER — Emergency Department (HOSPITAL_COMMUNITY): Payer: Managed Care, Other (non HMO)

## 2017-02-28 ENCOUNTER — Encounter (HOSPITAL_COMMUNITY): Payer: Self-pay | Admitting: Emergency Medicine

## 2017-02-28 ENCOUNTER — Emergency Department (HOSPITAL_COMMUNITY)
Admission: EM | Admit: 2017-02-28 | Discharge: 2017-02-28 | Disposition: A | Discharge status: Home / Self Care | Payer: Managed Care, Other (non HMO) | Attending: Emergency Medicine | Admitting: Emergency Medicine

## 2017-02-28 DIAGNOSIS — M549 Dorsalgia, unspecified: Secondary | ICD-10-CM | POA: Insufficient documentation

## 2017-02-28 DIAGNOSIS — R05 Cough: Secondary | ICD-10-CM

## 2017-02-28 DIAGNOSIS — J189 Pneumonia, unspecified organism: Secondary | ICD-10-CM

## 2017-02-28 DIAGNOSIS — R059 Cough, unspecified: Secondary | ICD-10-CM

## 2017-02-28 DIAGNOSIS — R110 Nausea: Secondary | ICD-10-CM | POA: Diagnosis not present

## 2017-02-28 DIAGNOSIS — I10 Essential (primary) hypertension: Secondary | ICD-10-CM | POA: Diagnosis not present

## 2017-02-28 DIAGNOSIS — Z79899 Other long term (current) drug therapy: Secondary | ICD-10-CM | POA: Insufficient documentation

## 2017-02-28 DIAGNOSIS — R042 Hemoptysis: Secondary | ICD-10-CM | POA: Diagnosis not present

## 2017-02-28 DIAGNOSIS — J181 Lobar pneumonia, unspecified organism: Secondary | ICD-10-CM | POA: Diagnosis not present

## 2017-02-28 DIAGNOSIS — E119 Type 2 diabetes mellitus without complications: Secondary | ICD-10-CM | POA: Diagnosis not present

## 2017-02-28 LAB — BASIC METABOLIC PANEL
ANION GAP: 8 (ref 5–15)
BUN: 7 mg/dL (ref 6–20)
CALCIUM: 8.9 mg/dL (ref 8.9–10.3)
CO2: 27 mmol/L (ref 22–32)
CREATININE: 0.75 mg/dL (ref 0.44–1.00)
Chloride: 103 mmol/L (ref 101–111)
Glucose, Bld: 274 mg/dL — ABNORMAL HIGH (ref 65–99)
Potassium: 3.5 mmol/L (ref 3.5–5.1)
SODIUM: 138 mmol/L (ref 135–145)

## 2017-02-28 LAB — CBC
HCT: 37.2 % (ref 36.0–46.0)
HEMOGLOBIN: 12.7 g/dL (ref 12.0–15.0)
MCH: 27.5 pg (ref 26.0–34.0)
MCHC: 34.1 g/dL (ref 30.0–36.0)
MCV: 80.5 fL (ref 78.0–100.0)
PLATELETS: 243 10*3/uL (ref 150–400)
RBC: 4.62 MIL/uL (ref 3.87–5.11)
RDW: 14.5 % (ref 11.5–15.5)
WBC: 4.5 10*3/uL (ref 4.0–10.5)

## 2017-02-28 LAB — I-STAT BETA HCG BLOOD, ED (MC, WL, AP ONLY)

## 2017-02-28 LAB — I-STAT TROPONIN, ED
TROPONIN I, POC: 0 ng/mL (ref 0.00–0.08)
TROPONIN I, POC: 0 ng/mL (ref 0.00–0.08)

## 2017-02-28 MED ORDER — LEVOFLOXACIN 750 MG PO TABS: 750.0000 mg | ORAL_TABLET | Freq: Every day | ORAL | 0 refills | Status: DC

## 2017-02-28 MED ORDER — IOPAMIDOL (ISOVUE-370) INJECTION 76%
INTRAVENOUS | Status: AC
  Administered 2017-02-28: 100 mL via INTRAVENOUS
  Filled 2017-02-28: qty 100

## 2017-02-28 NOTE — ED Triage Notes (Signed)
Patient here from home with complaints of chest pain and cough that started Wednesday. Central chest pain radiating into back. Reports productive cough, brown sputum. Denies n/v.

## 2017-02-28 NOTE — Discharge Instructions (Signed)
Make sure that you are getting plenty of rest and drinking a lot of fluids.  Your blood sugar was mildly elevated today and will need to be rechecked after you begin treatment and work on increase your oral fluid intake.  For fever or pain use Tylenol.  For cough use Robitussin-DM.

## 2017-02-28 NOTE — ED Provider Notes (Addendum)
Patient seen earlier by Dr. Billy Fischer, clinically with pneumonia and abnormal chest x-ray.  CT angiogram was ordered to evaluate for PE and confirm plain x-ray abnormality.  Imaging returned consistent with patchy opacity right lower lobe.  Patient has diabetes, treated with metformin.  At this time the patient is alert and continues to cough, and is somewhat uncomfortable.  Vital signs repeated at 15: 49 are normal.  Findings discussed with the patient, and, and all questions were answered.  Plan: Rx Levaquin, 7 days.  Robitussin DM for cough, work release for 2 days.   Daleen Bo, MD 02/28/17 Ladoris Gene    Daleen Bo, MD 02/28/17 [phone]

## 2017-02-28 NOTE — ED Provider Notes (Signed)
[facility] DEPT Provider Note   CSN: [id number] Arrival date & time: 02/28/17  1117     History   Chief Complaint Chief Complaint  Patient presents with  . Chest Pain  . Cough    HPI Lynn Fields is a 47 y.o. female.  HPI   Cough started Wednesday, getting worse since then.  Couldn't stop coughing. Coughed up brown sputum then some bright red blood.  Chest pain started yesterday, sharp pain on right side of chest with radiation to the back.  Worse with cough and deep breaths.  Shortness of breath started yesterday.  No fevers.  No congestion. No body aches.  No leg pain or swelling. Generalized weakness with ambulation.  Nausea, no vomiting. No known hx of asthma, had an inhaler before, feels some wheezing.  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 No hx of DVT or PE, no recent surgeries. No long trips car or airplane.    Past Medical History:  Diagnosis Date  . Arthritis    knees  . Depression   . Diabetes mellitus without complication ([place])   . Hypertension   . Migraines   . Ulcer     Patient Active Problem List   Diagnosis Date Noted  . Allergic reaction to food 02/04/2017  . Adjustment disorder with mixed anxiety and depressed mood 08/07/2016  . Degenerative arthritis of knee, bilateral 02/11/2016  . Routine general medical examination at a health care facility 08/16/2015  . Arthritis of left lower extremity 10/24/2014  . Arthritis of right lower extremity 07/05/2014  . Right knee pain 04/17/2014  . Unspecified sleep apnea 06/02/2013  . Vertigo 06/02/2013  . Essential hypertension 06/14/2012  . Diabetes type 2, uncontrolled ([place]) 06/14/2012  . Obesity, morbid ([place]) 06/14/2012    Past Surgical History:  Procedure Laterality Date  . CESAREAN SECTION  1993  . HYSTEROSCOPY WITH NOVASURE N/A 06/02/2016   Procedure: HYSTEROSCOPY WITH NOVASURE;  Surgeon: Paula Compton, MD;  Location: [facility];  Service: Gynecology;  Laterality: N/A;  . TUBAL  LIGATION  1993    OB History    No data available       Home Medications    Prior to Admission medications   Medication Sig Start Date End Date Taking? Authorizing Provider  atorvastatin (LIPITOR) 40 MG tablet TAKE 1 TABLET BY MOUTH ONCE DAILY AT  6  PM 01/29/17   Hoyt Koch, MD  buPROPion (WELLBUTRIN XL) 150 MG 24 hr tablet TAKE 1 TABLET BY MOUTH ONCE DAILY 02/04/17   Hoyt Koch, MD  CINNAMON PO Take 1 tablet by mouth daily.    [provider]  diphenhydrAMINE (BENADRYL) 25 MG tablet Take 1-2 tablets (25-50 mg total) by mouth every 4 (four) hours as needed for itching or allergies. 01/29/17   Street, [place], PA-C  losartan-hydrochlorothiazide (HYZAAR) 50-12.5 MG tablet Take 1 tablet by mouth daily. 08/07/16   Nche, Charlene Brooke, NP  metFORMIN (GLUCOPHAGE) 500 MG tablet Take 1 tablet (500 mg total) by mouth 2 (two) times daily with a meal. 08/07/16   Nche, Charlene Brooke, NP  ondansetron (ZOFRAN ODT) 4 MG disintegrating tablet Take 1 tablet (4 mg total) by mouth every 8 (eight) hours as needed for nausea or vomiting. 01/29/17   Street, [place], PA-C  promethazine-dextromethorphan (PROMETHAZINE-DM) 6.25-15 MG/5ML syrup Take 5 mLs by mouth 4 (four) times daily as needed for cough. Patient not taking: Reported on 02/03/2017 12/26/16   Hoyt Koch, MD  ranitidine (ZANTAC) 150 MG  tablet Take 1 tablet (150 mg total) by mouth 2 (two) times daily. x4 days 01/29/17   Street, [place], PA-C  TURMERIC PO Take 1 tablet by mouth daily.    [provider]    Family History Family History  Problem Relation Age of Onset  . Diabetes Mother   . Hypertension Mother   . Heart disease Mother   . Hyperlipidemia Mother   . Diabetes Father   . Hypertension Father   . Heart disease Father   . Hyperlipidemia Father   . Cervical cancer Sister   . Heart disease Sister   . Diabetes Sister   . Heart disease Sister   . Diabetes Sister     Social History Social  History   Tobacco Use  . Smoking status: Never Smoker  . Smokeless tobacco: Never Used  Substance Use Topics  . Alcohol use: Yes    Comment: occ  . Drug use: No     Allergies   Shellfish allergy; Codeine; Menthol; Morphine and related; Penicillins; Sudafed [pseudoephedrine hcl]; Azithromycin; and Erythromycin   Review of Systems Review of Systems  Constitutional: Positive for fatigue. Negative for diaphoresis and fever.  HENT: Negative for congestion and sore throat.   Eyes: Negative for visual disturbance.  Respiratory: Positive for cough, shortness of breath and wheezing.   Cardiovascular: Positive for chest pain.  Gastrointestinal: Positive for nausea. Negative for abdominal pain (with coughing) and vomiting.  Genitourinary: Negative for difficulty urinating.  Musculoskeletal: Positive for back pain. Negative for neck pain.  Skin: Negative for rash.  Neurological: Negative for syncope, light-headedness and headaches.     Physical Exam Updated Vital Signs BP 139/89 (BP Location: Left Arm)   Pulse 87   Temp 99.5 F (37.5 C) (Oral)   Resp 18   Ht 5' 0.25" (1.53 m)   SpO2 97%   BMI 57.91 kg/m   Physical Exam  Constitutional: She is oriented to person, place, and time. She appears well-developed and well-nourished. No distress.  HENT:  Head: Normocephalic and atraumatic.  Eyes: Conjunctivae and EOM are normal.  Neck: Normal range of motion.  Cardiovascular: Normal rate, regular rhythm, normal heart sounds and intact distal pulses. Exam reveals no gallop and no friction rub.  No murmur heard. Pulmonary/Chest: Effort normal and breath sounds normal. No respiratory distress. She has no wheezes. She has no rales.  Abdominal: Soft. She exhibits no distension. There is no tenderness. There is no guarding.  Musculoskeletal: She exhibits no edema or tenderness.  Neurological: She is alert and oriented to person, place, and time.  Skin: Skin is warm and dry. No rash noted.  She is not diaphoretic. No erythema.  Nursing note and vitals reviewed.    ED Treatments / Results  Labs (all labs ordered are listed, but only abnormal results are displayed) Labs Reviewed  BASIC METABOLIC PANEL - Abnormal; Notable for the following components:      Result Value   Glucose, Bld 274 (*)    All other components within normal limits  CBC  I-STAT TROPONIN, ED  I-STAT BETA HCG BLOOD, ED ([facility], [facility], [facility] ONLY)  I-STAT TROPONIN, ED    EKG  EKG Interpretation  Date/Time:  Saturday February 28 2017 11:50:25 EST Ventricular Rate:  91 PR Interval:    QRS Duration: 83 QT Interval:  328 QTC Calculation: 404 R Axis:   57 Text Interpretation:  Sinus rhythm Low voltage, precordial leads Nonspecific changes in comparison to prior Confirmed by Gareth Morgan [phone]) on  02/28/2017 2:50:24 PM       Radiology Dg Chest 2 View  Result Date: 02/28/2017 CLINICAL DATA:  Chest pain, cough EXAM: CHEST  2 VIEW COMPARISON:  05/24/2009 FINDINGS: There are low lung volumes. Hazy right basilar airspace disease which may reflect atelectasis versus pneumonia. There is no pleural effusion or pneumothorax. The heart and mediastinal contours are unremarkable. The osseous structures are unremarkable. IMPRESSION: Hazy right basilar airspace disease which may reflect atelectasis versus pneumonia. Followup PA and lateral chest X-ray is recommended in 3-4 weeks following trial of antibiotic therapy to ensure resolution and exclude underlying malignancy. Electronically Signed   By: Kathreen Devoid   On: 02/28/2017 12:47    Procedures Procedures (including critical care time)  Medications Ordered in ED Medications - No data to display   Initial Impression / Assessment and Plan / ED Course  I have reviewed the triage vital signs and the nursing notes.  Pertinent labs & imaging results that were available during my care of the patient were reviewed by me and considered in my medical decision making (see  chart for details).     47 year old female with a history of hypertension and diabetes presents with concern for cough shortness of breath and chest pain.  Chest x-ray shows possible atelectasis versus pneumonia in the right lung.  Labs show no leukocytosis, normal troponin, no other acute abnormalities. Trop neg x 2, doubt ACS.  EKG shows new S1 every 3 T3 in comparison to prior.  Patient does describe some hemoptysis along with her shortness of breath, and sharp pleuritic chest pain.  Will x-ray shows possible pneumonia, she is not having fevers, no leukocytosis, and given other symptoms and EKG findings, feel CT PE study is appropriate.  Signed out to Dr. Eulis Foster with CT PE study ordered.    IF there are no signs of pulmonary embolus, would treat for pneumonia as outpatient.   Final Clinical Impressions(s) / ED Diagnoses   Final diagnoses:  Cough  Hemoptysis    ED Discharge Orders    None       Gareth Morgan, MD 02/28/17 1705

## 2017-03-09 ENCOUNTER — Ambulatory Visit (INDEPENDENT_AMBULATORY_CARE_PROVIDER_SITE_OTHER)
Admission: RE | Admit: 2017-03-09 | Discharge: 2017-03-09 | Disposition: A | Discharge status: Home / Self Care | Payer: Managed Care, Other (non HMO) | Source: Ambulatory Visit | Attending: Internal Medicine | Admitting: Internal Medicine

## 2017-03-09 ENCOUNTER — Encounter: Payer: Self-pay | Admitting: Internal Medicine

## 2017-03-09 ENCOUNTER — Ambulatory Visit: Payer: Managed Care, Other (non HMO) | Admitting: Internal Medicine

## 2017-03-09 VITALS — BP 150/90 | HR 89 | Temp 97.8°F | Ht 60.25 in | Wt 296.0 lb

## 2017-03-09 DIAGNOSIS — J181 Lobar pneumonia, unspecified organism: Secondary | ICD-10-CM | POA: Diagnosis not present

## 2017-03-09 DIAGNOSIS — E1165 Type 2 diabetes mellitus with hyperglycemia: Secondary | ICD-10-CM

## 2017-03-09 DIAGNOSIS — J189 Pneumonia, unspecified organism: Secondary | ICD-10-CM

## 2017-03-09 DIAGNOSIS — T781XXD Other adverse food reactions, not elsewhere classified, subsequent encounter: Secondary | ICD-10-CM

## 2017-03-09 MED ORDER — BENZONATATE 200 MG PO CAPS: 200.0000 mg | ORAL_CAPSULE | Freq: Three times a day (TID) | ORAL | 0 refills | Status: DC | PRN

## 2017-03-09 NOTE — Assessment & Plan Note (Signed)
No recurrence since last visit and she is continuing to avoid exposure to odors from shellfish at all. No itching or rash on exam today off steroids. Continues to take allergy medication.

## 2017-03-09 NOTE — Assessment & Plan Note (Signed)
Will not treat with steroids given her concurrent diabetes as she has had several steroid courses recently with her allergic reactions.

## 2017-03-09 NOTE — Patient Instructions (Signed)
We have sent in the perles and checking the chest x-ray.

## 2017-03-09 NOTE — Progress Notes (Signed)
   Subjective:    Patient ID: Lynn Fields, female    DOB: 1970-06-10, 47 y.o.   MRN: [id number]  HPI The patient is a 47 YO female coming in for ER follow up (in several times since last visit including for allergic reaction and cough which was diagnosed of pneumonia, treated with levaquin for pneumonia for 1 week). She has finished the antibiotics and did not notice much difference. She denies nose or sinus drainage. She is having pain with deep breathing and with coughing. She is still coughing up some dark to light brown sputum. Some states that she is still coughing as much as before the antibiotics. No fevers or chills. No muscle aches.   PMH, [facility], social history reviewed and updated.   Review of Systems  Constitutional: Positive for activity change, appetite change and fatigue. Negative for chills, fever and unexpected weight change.  HENT: Negative for congestion, ear discharge, ear pain, postnasal drip, rhinorrhea, sinus pressure, sinus pain, sneezing, sore throat, tinnitus, trouble swallowing and voice change.   Eyes: Negative.   Respiratory: Positive for cough and shortness of breath. Negative for chest tightness and wheezing.   Cardiovascular: Positive for chest pain.  Gastrointestinal: Negative.   Musculoskeletal: Negative.   Skin: Negative.   Neurological: Negative.   Psychiatric/Behavioral: Negative.       Objective:   Physical Exam  Constitutional: She is oriented to person, place, and time. She appears well-developed and well-nourished.  HENT:  Head: Normocephalic and atraumatic.  Oropharynx with redness and clear drainage, nose with swollen turbinates, TMs normal bilaterally  Eyes: EOM are normal.  Neck: Normal range of motion. No thyromegaly present.  Cardiovascular: Normal rate and regular rhythm.  Pulmonary/Chest: Effort normal and breath sounds normal. No respiratory distress. She has no wheezes. She has no rales.  Due to habitus exam is limited but no overt  wheezing, rales, rhonchi on exam  Abdominal: Soft. She exhibits no distension. There is no tenderness. There is no rebound.  Musculoskeletal: She exhibits no tenderness.  Lymphadenopathy:    She has no cervical adenopathy.  Neurological: She is alert and oriented to person, place, and time.  Skin: Skin is warm and dry.   Vitals:   03/09/17 1400  BP: (!) 150/90  Pulse: 89  Temp: 97.8 F (36.6 C)  TempSrc: Oral  SpO2: 99%  Weight: 296 lb (134.3 kg)  Height: 5' 0.25" (1.53 m)      Assessment & Plan:

## 2017-03-09 NOTE — Assessment & Plan Note (Signed)
Lung sounds clear on exam. Checking CXR and re-treat if no change in exudate. She states no change in coughing but very limited coughing in the exam room during visit. Rx for tessalon perles for coughing. Given appropriate course of antibiotics.

## 2017-04-12 NOTE — Progress Notes (Signed)
[name] [address] [place], [place] [address] Phone: [phone] Subjective:    I'm seeing this patient by the request  of:    CC: Bilateral knee pain  FUX:[id number]  Lynn Fields is a 47 y.o. female coming in with complaint of bilateral knee pain.  Known severe arthritis in the knees bilaterally.  Patient is not.  Patient states that the right is worse than the left. She has been having chronic pain and has had injections that have helped. She is having trouble sleeping due to the pain.      Past Medical History:  Diagnosis Date  . Arthritis    knees  . Depression   . Diabetes mellitus without complication ([place])   . Hypertension   . Migraines   . Ulcer    Past Surgical History:  Procedure Laterality Date  . CESAREAN SECTION  1993  . HYSTEROSCOPY WITH NOVASURE N/A 06/02/2016   Procedure: HYSTEROSCOPY WITH NOVASURE;  Surgeon: Paula Compton, MD;  Location: [facility];  Service: Gynecology;  Laterality: N/A;  . TUBAL LIGATION  1993   Social History   Socioeconomic History  . Marital status: Married    Spouse name: None  . Number of children: None  . Years of education: 29  . Highest education level: None  Social Needs  . Financial resource strain: None  . Food insecurity - worry: None  . Food insecurity - inability: None  . Transportation needs - medical: None  . Transportation needs - non-medical: None  Occupational History  . Occupation: Product manager: [employer]  Tobacco Use  . Smoking status: Never Smoker  . Smokeless tobacco: Never Used  Substance and Sexual Activity  . Alcohol use: Yes    Comment: occ  . Drug use: No  . Sexual activity: Yes    Birth control/protection: Surgical  Other Topics Concern  . None  Social History Narrative   Regular exercise-yes   Caffeine Use-no   Allergies  Allergen Reactions  . Shellfish Allergy Anaphylaxis    Closes throat  . Codeine Itching  . Menthol Hives    . Morphine And Related Itching  . Penicillins Swelling    Has patient had a PCN reaction causing immediate rash, facial/tongue/throat swelling, SOB or lightheadedness with hypotension: Yes Has patient had a PCN reaction causing severe rash involving mucus membranes or skin necrosis: No Has patient had a PCN reaction that required hospitalization No Has patient had a PCN reaction occurring within the last 10 years: No If all of the above answers are "NO", then may proceed with Cephalosporin use.  [place] [place] [Pseudoephedrine Hcl] Swelling  . Azithromycin Rash  . Erythromycin Rash   Family History  Problem Relation Age of Onset  . Diabetes Mother   . Hypertension Mother   . Heart disease Mother   . Hyperlipidemia Mother   . Diabetes Father   . Hypertension Father   . Heart disease Father   . Hyperlipidemia Father   . Cervical cancer Sister   . Heart disease Sister   . Diabetes Sister   . Heart disease Sister   . Diabetes Sister      Past medical history, social, surgical and family history all reviewed in electronic medical record.  No pertanent information unless stated regarding to the chief complaint.   Review of Systems:Review of systems updated and as accurate as of 04/13/17  No headache, visual changes, nausea, vomiting, diarrhea, constipation, dizziness,  abdominal pain, skin rash, fevers, chills, night sweats, weight loss, swollen lymph nodes, body aches,  chest pain, shortness of breath, mood changes.  Positive muscle aches and joint swelling  Objective  Blood pressure (!) 118/92, pulse 76, height 5' 0.5" (1.537 m), weight 299 lb (135.6 kg), SpO2 94 %. Systems examined below as of 04/13/17   General: No apparent distress alert and oriented x3 mood and affect normal, dressed appropriately.  Morbidly obese HEENT: Pupils equal, extraocular movements intact  Respiratory: Patient's speak in full sentences and does not appear short of breath  Cardiovascular: Trace lower  extremity edema, non tender, no erythema  Skin: Warm dry intact with no signs of infection or rash on extremities or on axial skeleton.  Abdomen: Soft nontender  Neuro: Cranial nerves II through XII are intact, neurovascularly intact in all extremities with 2+ DTRs and 2+ pulses.  Lymph: No lymphadenopathy of posterior or anterior cervical chain or axillae bilaterally.  Gait mild antalgic MSK:  Non tender with full range of motion and good stability and symmetric strength and tone of shoulders, elbows, wrist, hip, and ankles bilaterally.  Knee: Bilateral valgus deformity noted. Large thigh to calf ratio.  Tender to palpation over medial and PF joint line.  ROM full in flexion and extension and lower leg rotation. instability with valgus force.  painful patellar compression. Patellar glide with moderate crepitus. Patellar and quadriceps tendons unremarkable. Hamstring and quadriceps strength is normal.  After informed written and verbal consent, patient was seated on exam table. Right knee was prepped with alcohol swab and utilizing anterolateral approach, patient's right knee space was injected with 4:1  marcaine 0.5%: Kenalog 40mg /dL. Patient tolerated the procedure well without immediate complications.  After informed written and verbal consent, patient was seated on exam table. Left knee was prepped with alcohol swab and utilizing anterolateral approach, patient's left knee space was injected with 4:1  marcaine 0.5%: Kenalog 40mg /dL. Patient tolerated the procedure well without immediate complications.   Impression and Recommendations:     This case required medical decision making of moderate complexity.      Note: This dictation was prepared with Dragon dictation along with smaller phrase technology. Any transcriptional errors that result from this process are unintentional.

## 2017-04-13 ENCOUNTER — Encounter: Payer: Self-pay | Admitting: Family Medicine

## 2017-04-13 ENCOUNTER — Ambulatory Visit: Payer: Managed Care, Other (non HMO) | Admitting: Family Medicine

## 2017-04-13 DIAGNOSIS — M170 Bilateral primary osteoarthritis of knee: Secondary | ICD-10-CM

## 2017-04-13 NOTE — Patient Instructions (Signed)
Good to see you  Lynn Fields is your friend.  Stay active.  See me again in 4 weeks if you need me  [phone]

## 2017-04-13 NOTE — Assessment & Plan Note (Signed)
Has responded fairly well to steroid injections.  Discussed icing regimen and home exercise.  Encourage weight loss.  Discussed topical anti-inflammatories.  Follow-up again in 4 weeks.  Could be a candidate for Visco supplementation.

## 2017-04-29 ENCOUNTER — Telehealth: Payer: Self-pay | Admitting: Internal Medicine

## 2017-04-29 MED ORDER — LOSARTAN POTASSIUM 50 MG PO TABS: 50.0000 mg | ORAL_TABLET | Freq: Every day | ORAL | 1 refills | Status: DC

## 2017-04-29 MED ORDER — HYDROCHLOROTHIAZIDE 12.5 MG PO CAPS: 12.5000 mg | ORAL_CAPSULE | Freq: Every day | ORAL | 1 refills | Status: DC

## 2017-04-29 NOTE — Telephone Encounter (Signed)
Copied from [place] [id number]. Topic: General - Other >> Apr 29, 2017 12:50 PM Darl Householder, RMA wrote: Reason for CRM: patient is requesting a callback concerning BP meds and change in medication , please return pt call

## 2017-04-29 NOTE — Telephone Encounter (Signed)
Called patient and states that her BP medication is on back order at each [facility] and is wondering if the medication can be split and given as separate pills. Patient is on losartan/HCT 50-12.5MG  tabs and pharmacy is [facility] on [address]  Patient took last BP pill this morning. Please advise in Dr. Nathanial Millman absence. thanks

## 2017-04-29 NOTE — Telephone Encounter (Signed)
Yes, ok to split meds

## 2017-04-29 NOTE — Telephone Encounter (Signed)
Medication sent to pharmacy and patient informed

## 2017-05-07 ENCOUNTER — Encounter: Payer: Self-pay | Admitting: Internal Medicine

## 2017-05-07 ENCOUNTER — Other Ambulatory Visit (INDEPENDENT_AMBULATORY_CARE_PROVIDER_SITE_OTHER): Payer: Managed Care, Other (non HMO)

## 2017-05-07 ENCOUNTER — Ambulatory Visit: Payer: Managed Care, Other (non HMO) | Admitting: Internal Medicine

## 2017-05-07 VITALS — BP 110/80 | HR 69 | Temp 98.4°F | Ht 60.5 in | Wt 301.0 lb

## 2017-05-07 DIAGNOSIS — E11649 Type 2 diabetes mellitus with hypoglycemia without coma: Secondary | ICD-10-CM

## 2017-05-07 LAB — LIPID PANEL
CHOLESTEROL: 153 mg/dL (ref 0–200)
HDL: 50.3 mg/dL (ref >39.00)
LDL CALC: 91 mg/dL (ref 0–99)
NONHDL: 102.87
Total CHOL/HDL Ratio: 3
Triglycerides: 58 mg/dL (ref 0.0–149.0)
VLDL: 11.6 mg/dL (ref 0.0–40.0)

## 2017-05-07 NOTE — Assessment & Plan Note (Signed)
Checking fructosamine today and adjust as needed. She is asked to stop glipizide and not start any new meds until we get the results. Also checking the lipid panel as she started lipitor since last.

## 2017-05-07 NOTE — Patient Instructions (Signed)
Stop taking the glipizide.   We are checking the labs today and will get back to you on the results.

## 2017-05-07 NOTE — Progress Notes (Signed)
   Subjective:    Patient ID: Lynn Fields, female    DOB: 1970/05/24, 47 y.o.   MRN: [id number]  HPI The patient is a 47 YO female coming in for 1 week of low sugars. They are going into the 60s and she is having to eat all day to keep up with them. She is taking glipizide (1/2 pill daily) 5 mg which we had previously stopped. She started this again when she took steroids in Jan/Feb due to high sugars. She is not taking metformin currently and got diarrhea from it. She has been having some nausea in the last several weeks with mild stomach discomfort.   Review of Systems  Constitutional: Negative.   Respiratory: Negative for cough, chest tightness and shortness of breath.   Cardiovascular: Negative for chest pain, palpitations and leg swelling.  Gastrointestinal: Positive for abdominal pain and nausea. Negative for abdominal distention, constipation, diarrhea and vomiting.  Musculoskeletal: Negative.   Skin: Negative.   Neurological: Negative.       Objective:   Physical Exam  Constitutional: She is oriented to person, place, and time. She appears well-developed and well-nourished.  Overweight  HENT:  Head: Normocephalic and atraumatic.  Eyes: EOM are normal.  Neck: Normal range of motion.  Cardiovascular: Normal rate and regular rhythm.  Pulmonary/Chest: Effort normal and breath sounds normal. No respiratory distress. She has no wheezes. She has no rales.  Abdominal: Soft. Bowel sounds are normal. She exhibits no distension. There is no tenderness. There is no rebound.  Musculoskeletal: She exhibits no edema.  Neurological: She is alert and oriented to person, place, and time. Coordination normal.  Skin: Skin is warm and dry.   Vitals:   05/07/17 0808  BP: 110/80  Pulse: 69  Temp: 98.4 F (36.9 C)  TempSrc: Oral  SpO2: 98%  Weight: (!) 301 lb (136.5 kg)  Height: 5' 0.5" (1.537 m)      Assessment & Plan:

## 2017-05-09 NOTE — Progress Notes (Addendum)
[name] [place], [place] [address] Phone: [phone] Subjective:     CC: Knee pain follow-up  [id number]  Lynn Fields is a 47 y.o. female coming in with complaint of bilateral knee pain. She said that she worked out yesterday and didn't ice her knees. She is experiencing the soreness in the knees. She said that the injections did help with her pain.  Patient does have severe arthritis.  Has responded to Visco supplementation before.  Does have approval and would like to start in again today potentially.  Still having some difficulty with home exercises and icing regimen.     Past Medical History:  Diagnosis Date  . Arthritis    knees  . Depression   . Diabetes mellitus without complication ([place])   . Hypertension   . Migraines   . Ulcer    Past Surgical History:  Procedure Laterality Date  . CESAREAN SECTION  1993  . HYSTEROSCOPY WITH NOVASURE N/A 06/02/2016   Procedure: HYSTEROSCOPY WITH NOVASURE;  Surgeon: Paula Compton, MD;  Location: [facility];  Service: Gynecology;  Laterality: N/A;  . TUBAL LIGATION  1993   Social History   Socioeconomic History  . Marital status: Married    Spouse name: Not on file  . Number of children: Not on file  . Years of education: 36  . Highest education level: Not on file  Occupational History  . Occupation: Product manager: [employer]  . Financial resource strain: Not on file  . Food insecurity:    Worry: Not on file    Inability: Not on file  . Transportation needs:    Medical: Not on file    Non-medical: Not on file  Tobacco Use  . Smoking status: Never Smoker  . Smokeless tobacco: Never Used  Substance and Sexual Activity  . Alcohol use: Yes    Comment: occ  . Drug use: No  . Sexual activity: Yes    Birth control/protection: Surgical  Lifestyle  . Physical activity:    Days per week: Not on file    Minutes per session: Not on file  .  Stress: Not on file  Relationships  . Social connections:    Talks on phone: Not on file    Gets together: Not on file    Attends religious service: Not on file    Active member of club or organization: Not on file    Attends meetings of clubs or organizations: Not on file    Relationship status: Not on file  Other Topics Concern  . Not on file  Social History Narrative   Regular exercise-yes   Caffeine Use-no   Allergies  Allergen Reactions  . Shellfish Allergy Anaphylaxis    Closes throat  . Codeine Itching  . Menthol Hives  . Morphine And Related Itching  . Penicillins Swelling    Has patient had a PCN reaction causing immediate rash, facial/tongue/throat swelling, SOB or lightheadedness with hypotension: Yes Has patient had a PCN reaction causing severe rash involving mucus membranes or skin necrosis: No Has patient had a PCN reaction that required hospitalization No Has patient had a PCN reaction occurring within the last 10 years: No If all of the above answers are "NO", then may proceed with Cephalosporin use.  Ebbie Ridge [Pseudoephedrine Hcl] Swelling  . Azithromycin Rash  . Erythromycin Rash   Family History  Problem Relation Age of Onset  .  Diabetes Mother   . Hypertension Mother   . Heart disease Mother   . Hyperlipidemia Mother   . Diabetes Father   . Hypertension Father   . Heart disease Father   . Hyperlipidemia Father   . Cervical cancer Sister   . Heart disease Sister   . Diabetes Sister   . Heart disease Sister   . Diabetes Sister      Past medical history, social, surgical and family history all reviewed in electronic medical record.  No pertanent information unless stated regarding to the chief complaint.   Review of Systems:Review of systems updated and as accurate as of 05/11/17  No headache, visual changes, nausea, vomiting, diarrhea, constipation, dizziness, abdominal pain, skin rash, fevers, chills, night sweats, weight loss, swollen lymph  nodes, body aches, chest pain, shortness of breath, mood changes.  Positive muscle aches and joint swelling  Objective  Blood pressure 110/82, pulse 66, height 5' 0.5" (1.537 m), weight 298 lb (135.2 kg), SpO2 96 %. Systems examined below as of 05/11/17   General: No apparent distress alert and oriented x3 mood and affect normal, dressed appropriately.  Morbidly obese HEENT: Pupils equal, extraocular movements intact  Respiratory: Patient's speak in full sentences and does not appear short of breath  Cardiovascular: No lower extremity edema, non tender, no erythema  Skin: Warm dry intact with no signs of infection or rash on extremities or on axial skeleton.  Abdomen: Soft nontender  Neuro: Cranial nerves II through XII are intact, neurovascularly intact in all extremities with 2+ DTRs and 2+ pulses.  Lymph: No lymphadenopathy of posterior or anterior cervical chain or axillae bilaterally.  Gait normal with good balance and coordination.  MSK:  Non tender with full range of motion and good stability and symmetric strength and tone of shoulders, elbows, wrist, hip, and ankles bilaterally.  Knee: Bilateral valgus deformity noted. Large thigh to calf ratio.  Tender to palpation over medial and PF joint line.  ROM full in flexion and extension and lower leg rotation. instability with valgus force.  painful patellar compression. Patellar glide with moderate crepitus. Patellar and quadriceps tendons unremarkable. Hamstring and quadriceps strength is normal.  After informed written and verbal consent, patient was seated on exam table. Right knee was prepped with alcohol swab and utilizing anterolateral approach, patient's right knee space was injected with15 mg/2.5 mL of Orthovisc(sodium hyaluronate) in a prefilled syringe was injected easily into the knee through a 22-gauge needle..Patient tolerated the procedure well without immediate complications.  After informed written and verbal consent,  patient was seated on exam table. Left knee was prepped with alcohol swab and utilizing anterolateral approach, patient's left knee space was injected with15 mg/2.5 mL of Orthovisc(sodium hyaluronate) in a prefilled syringe was injected easily into the knee through a 22-gauge needle..Patient tolerated the procedure well without immediate complications.    Impression and Recommendations:     This case required medical decision making of moderate complexity.      Note: This dictation was prepared with Dragon dictation along with smaller phrase technology. Any transcriptional errors that result from this process are unintentional.

## 2017-05-11 ENCOUNTER — Encounter: Payer: Self-pay | Admitting: Family Medicine

## 2017-05-11 ENCOUNTER — Ambulatory Visit: Payer: Managed Care, Other (non HMO) | Admitting: Family Medicine

## 2017-05-11 DIAGNOSIS — M170 Bilateral primary osteoarthritis of knee: Secondary | ICD-10-CM

## 2017-05-11 LAB — FRUCTOSAMINE: Fructosamine: 242 umol/L (ref 190–270)

## 2017-05-11 NOTE — Assessment & Plan Note (Signed)
Again.  Discussed Visco supplementation and icing regimen.  Discussed which activities of doing which wants to avoid.  Patient wants to do topical anti-inflammatories.  Patient will follow-up in 1 week for second in a series of 4 injections bilaterally.  Continue all other conservative therapy

## 2017-05-11 NOTE — Patient Instructions (Signed)
Good to see you  Overall you should do great  You know the drill  See me again next week

## 2017-05-12 ENCOUNTER — Encounter: Payer: Self-pay | Admitting: Internal Medicine

## 2017-05-17 NOTE — Progress Notes (Signed)
[name] [address] [place], [place] [address] Phone: [phone] Subjective:     CC: bilateral knee   [id number]  Lynn Fields is a 47 y.o. female coming in with complaint of bilateral knee.  Severe arthritis bilaterally.  Here for second in a series of 4 Visco supplementation injections.  Patient states improvement already.  He denies any numbness or tingling.  Patient states that he is noticing some increasing range of motion.      Past Medical History:  Diagnosis Date  . Arthritis    knees  . Depression   . Diabetes mellitus without complication ([facility])   . Hypertension   . Migraines   . Ulcer    Past Surgical History:  Procedure Laterality Date  . CESAREAN SECTION  1993  . HYSTEROSCOPY WITH NOVASURE N/A 06/02/2016   Procedure: HYSTEROSCOPY WITH NOVASURE;  Surgeon: Paula Compton, MD;  Location: [facility];  Service: Gynecology;  Laterality: N/A;  . TUBAL LIGATION  1993   Social History   Socioeconomic History  . Marital status: Married    Spouse name: Not on file  . Number of children: Not on file  . Years of education: 48  . Highest education level: Not on file  Occupational History  . Occupation: Product manager: [employer]  . Financial resource strain: Not on file  . Food insecurity:    Worry: Not on file    Inability: Not on file  . Transportation needs:    Medical: Not on file    Non-medical: Not on file  Tobacco Use  . Smoking status: Never Smoker  . Smokeless tobacco: Never Used  Substance and Sexual Activity  . Alcohol use: Yes    Comment: occ  . Drug use: No  . Sexual activity: Yes    Birth control/protection: Surgical  Lifestyle  . Physical activity:    Days per week: Not on file    Minutes per session: Not on file  . Stress: Not on file  Relationships  . Social connections:    Talks on phone: Not on file    Gets together: Not on file    Attends religious service: Not  on file    Active member of club or organization: Not on file    Attends meetings of clubs or organizations: Not on file    Relationship status: Not on file  Other Topics Concern  . Not on file  Social History Narrative   Regular exercise-yes   Caffeine Use-no   Allergies  Allergen Reactions  . Shellfish Allergy Anaphylaxis    Closes throat  . Codeine Itching  . Menthol Hives  . Morphine And Related Itching  . Penicillins Swelling    Has patient had a PCN reaction causing immediate rash, facial/tongue/throat swelling, SOB or lightheadedness with hypotension: Yes Has patient had a PCN reaction causing severe rash involving mucus membranes or skin necrosis: No Has patient had a PCN reaction that required hospitalization No Has patient had a PCN reaction occurring within the last 10 years: No If all of the above answers are "NO", then may proceed with Cephalosporin use.  Ebbie Ridge [Pseudoephedrine Hcl] Swelling  . Azithromycin Rash  . Erythromycin Rash   Family History  Problem Relation Age of Onset  . Diabetes Mother   . Hypertension Mother   . Heart disease Mother   . Hyperlipidemia Mother   . Diabetes Father   . Hypertension  Father   . Heart disease Father   . Hyperlipidemia Father   . Cervical cancer Sister   . Heart disease Sister   . Diabetes Sister   . Heart disease Sister   . Diabetes Sister      Past medical history, social, surgical and family history all reviewed in electronic medical record.  No pertanent information unless stated regarding to the chief complaint.   Review of Systems:Review of systems updated and as accurate as of 05/18/17  No headache, visual changes, nausea, vomiting, diarrhea, constipation, dizziness, abdominal pain, skin rash, fevers, chills, night sweats, weight loss, swollen lymph nodes, body aches, joint swelling, muscle aches, chest pain, shortness of breath, mood changes.   Objective  Blood pressure 118/90, pulse 65, height 5'  (1.524 m), weight 299 lb (135.6 kg), SpO2 94 %. Systems examined below as of 05/18/17   General: No apparent distress alert and oriented x3 mood and affect normal, dressed appropriately.  HEENT: Pupils equal, extraocular movements intact  Respiratory: Patient's speak in full sentences and does not appear short of breath  Cardiovascular: No lower extremity edema, non tender, no erythema  Skin: Warm dry intact with no signs of infection or rash on extremities or on axial skeleton.  Abdomen: Soft nontender  Neuro: Cranial nerves II through XII are intact, neurovascularly intact in all extremities with 2+ DTRs and 2+ pulses.  Lymph: No lymphadenopathy of posterior or anterior cervical chain or axillae bilaterally.  Gait abnormality gait MSK:  Non tender with full range of motion and good stability and symmetric strength and tone of shoulders, elbows, wrist, hip, and ankles bilaterally.  Knee: Bilateral valgus deformity noted. Large thigh to calf ratio.  Difficult to assess secondary to patient's body habitus Tender to palpation over medial and PF joint line.  ROM full in flexion and extension and lower leg rotation. instability with valgus force.  Mild painful patellar compression. Patellar glide with moderate crepitus. Patellar and quadriceps tendons unremarkable. Hamstring and quadriceps strength is normal.  After informed written and verbal consent, patient was seated on exam table. Right knee was prepped with alcohol swab and utilizing anterolateral approach, patient's right knee space was injected with 4:1  marcaine 0.5%: Kenalog 40mg /dL. Patient tolerated the procedure well without immediate complications.  After informed written and verbal consent, patient was seated on exam table. Left knee was prepped with alcohol swab and utilizing anterolateral approach, patient's left knee space was injected with 4:1  marcaine 0.5%: Kenalog 40mg /dL. Patient tolerated the procedure well without  immediate complications.   Impression and Recommendations:     This case required medical decision making of moderate complexity.      Note: This dictation was prepared with Dragon dictation along with smaller phrase technology. Any transcriptional errors that result from this process are unintentional.

## 2017-05-18 ENCOUNTER — Encounter: Payer: Self-pay | Admitting: Family Medicine

## 2017-05-18 ENCOUNTER — Ambulatory Visit: Payer: Managed Care, Other (non HMO) | Admitting: Family Medicine

## 2017-05-18 DIAGNOSIS — M170 Bilateral primary osteoarthritis of knee: Secondary | ICD-10-CM

## 2017-05-18 NOTE — Assessment & Plan Note (Signed)
Visco supplementation.  Discussed icing regimen and home exercises.  Which activities to do which wants to avoid.  Patient is to increase activity as tolerated.  Patient will follow up with me again in 1 week for third in a series of 4 injections.

## 2017-05-18 NOTE — Patient Instructions (Signed)
Good to see you  Ice is your friend  See you next week!

## 2017-05-24 NOTE — Progress Notes (Signed)
[name] [place] [place], [place] [address] Phone: [phone] Subjective:    I'm seeing this patient by the request  of:    CC: Bilateral knee pain  [id number]  Lynn Fields is a 47 y.o. female coming in with complaint of knee pain. She has had some soreness in the right leg but has not been icing as much as she did before. She also had a lot of family over to the house so she was up on her feet more.       Past Medical History:  Diagnosis Date  . Arthritis    knees  . Depression   . Diabetes mellitus without complication ([place])   . Hypertension   . Migraines   . Ulcer    Past Surgical History:  Procedure Laterality Date  . CESAREAN SECTION  1993  . HYSTEROSCOPY WITH NOVASURE N/A 06/02/2016   Procedure: HYSTEROSCOPY WITH NOVASURE;  Surgeon: Paula Compton, MD;  Location: [facility];  Service: Gynecology;  Laterality: N/A;  . TUBAL LIGATION  1993   Social History   Socioeconomic History  . Marital status: Married    Spouse name: Not on file  . Number of children: Not on file  . Years of education: 40  . Highest education level: Not on file  Occupational History  . Occupation: Product manager: [employer]  . Financial resource strain: Not on file  . Food insecurity:    Worry: Not on file    Inability: Not on file  . Transportation needs:    Medical: Not on file    Non-medical: Not on file  Tobacco Use  . Smoking status: Never Smoker  . Smokeless tobacco: Never Used  Substance and Sexual Activity  . Alcohol use: Yes    Comment: occ  . Drug use: No  . Sexual activity: Yes    Birth control/protection: Surgical  Lifestyle  . Physical activity:    Days per week: Not on file    Minutes per session: Not on file  . Stress: Not on file  Relationships  . Social connections:    Talks on phone: Not on file    Gets together: Not on file    Attends religious service: Not on file    Active  member of club or organization: Not on file    Attends meetings of clubs or organizations: Not on file    Relationship status: Not on file  Other Topics Concern  . Not on file  Social History Narrative   Regular exercise-yes   Caffeine Use-no   Allergies  Allergen Reactions  . Shellfish Allergy Anaphylaxis    Closes throat  . Codeine Itching  . Menthol Hives  . Morphine And Related Itching  . Penicillins Swelling    Has patient had a PCN reaction causing immediate rash, facial/tongue/throat swelling, SOB or lightheadedness with hypotension: Yes Has patient had a PCN reaction causing severe rash involving mucus membranes or skin necrosis: No Has patient had a PCN reaction that required hospitalization No Has patient had a PCN reaction occurring within the last 10 years: No If all of the above answers are "NO", then may proceed with Cephalosporin use.  Ebbie Ridge [Pseudoephedrine Hcl] Swelling  . Azithromycin Rash  . Erythromycin Rash   Family History  Problem Relation Age of Onset  . Diabetes Mother   . Hypertension Mother   . Heart disease Mother   .  Hyperlipidemia Mother   . Diabetes Father   . Hypertension Father   . Heart disease Father   . Hyperlipidemia Father   . Cervical cancer Sister   . Heart disease Sister   . Diabetes Sister   . Heart disease Sister   . Diabetes Sister      Past medical history, social, surgical and family history all reviewed in electronic medical record.  No pertanent information unless stated regarding to the chief complaint.   Review of Systems:Review of systems updated and as accurate as of 05/25/17  No headache, visual changes, nausea, vomiting, diarrhea, constipation, dizziness, abdominal pain, skin rash, fevers, chills, night sweats, weight loss, swollen lymph nodes, body aches, joint swelling,  chest pain, shortness of breath, mood changes.  Positive muscle aches  Objective  Blood pressure 118/84, pulse 89, height 5' (1.524 m),  weight 298 lb (135.2 kg), SpO2 98 %. Systems examined below as of 05/25/17   General: No apparent distress alert and oriented x3 mood and affect normal, dressed appropriately.  HEENT: Pupils equal, extraocular movements intact  Respiratory: Patient's speak in full sentences and does not appear short of breath  Cardiovascular: No lower extremity edema, non tender, no erythema  Skin: Warm dry intact with no signs of infection or rash on extremities or on axial skeleton.  Abdomen: Soft nontender  Neuro: Cranial nerves II through XII are intact, neurovascularly intact in all extremities with 2+ DTRs and 2+ pulses.  Lymph: No lymphadenopathy of posterior or anterior cervical chain or axillae bilaterally.  Gait antalgic gait MSK:  Non tender with full range of motion and good stability and symmetric strength and tone of shoulders, elbows, wrist, hip, and ankles bilaterally.  Knee: Bilateral valgus deformity noted. Large thigh to calf ratio.  Tender to palpation over medial and PF joint line.  ROM full in flexion and extension and lower leg rotation. instability with valgus force.  painful patellar compression. Patellar glide with moderate crepitus. Patellar and quadriceps tendons unremarkable. Hamstring and quadriceps strength is normal.  After informed written and verbal consent, patient was seated on exam table. Right knee was prepped with alcohol swab and utilizing anterolateral approach, patient's right knee space was injected with15 mg/2.5 mL of Orthovisc(sodium hyaluronate) in a prefilled syringe was injected easily into the knee through a 22-gauge needle..Patient tolerated the procedure well without immediate complications.  After informed written and verbal consent, patient was seated on exam table. Left knee was prepped with alcohol swab and utilizing anterolateral approach, patient's left knee space was injected with15 mg/2.5 mL of Orthovisc(sodium hyaluronate) in a prefilled syringe was  injected easily into the knee through a 22-gauge needle..Patient tolerated the procedure well without immediate complications.   Impression and Recommendations:     This case required medical decision making of moderate complexity.      Note: This dictation was prepared with Dragon dictation along with smaller phrase technology. Any transcriptional errors that result from this process are unintentional.

## 2017-05-25 ENCOUNTER — Ambulatory Visit: Payer: Managed Care, Other (non HMO) | Admitting: Family Medicine

## 2017-05-25 ENCOUNTER — Encounter: Payer: Self-pay | Admitting: Family Medicine

## 2017-05-25 DIAGNOSIS — M170 Bilateral primary osteoarthritis of knee: Secondary | ICD-10-CM

## 2017-05-25 NOTE — Assessment & Plan Note (Addendum)
Patient given third in a series of 4 injections.  Discussed icing regimen and home exercise.  Discussed which activities of doing which wants to avoid.  Increase activity as tolerated.  Discussed avoiding certain possibilities such as increasing instability with lateral forces.  Follow-up again in 2 weeks for fourth and final injection

## 2017-06-07 NOTE — Progress Notes (Signed)
[name] [address] [place], [place] [address] Phone: [phone] Subjective:     CC: Bilateral knee pain  MWN:[id number]  Lynn Fields is a 47 y.o. female coming in with complaint of bilateral knee pain. She states that her left knee is doing much better and her right is slowly improving.  Failed conservative therapy.  Here for fourth and final Visco supplementation bilaterally     Past Medical History:  Diagnosis Date  . Arthritis    knees  . Depression   . Diabetes mellitus without complication ([facility])   . Hypertension   . Migraines   . Ulcer    Past Surgical History:  Procedure Laterality Date  . CESAREAN SECTION  1993  . HYSTEROSCOPY WITH NOVASURE N/A 06/02/2016   Procedure: HYSTEROSCOPY WITH NOVASURE;  Surgeon: Paula Compton, MD;  Location: [facility];  Service: Gynecology;  Laterality: N/A;  . TUBAL LIGATION  1993   Social History   Socioeconomic History  . Marital status: Married    Spouse name: Not on file  . Number of children: Not on file  . Years of education: 42  . Highest education level: Not on file  Occupational History  . Occupation: Product manager: [employer]  . Financial resource strain: Not on file  . Food insecurity:    Worry: Not on file    Inability: Not on file  . Transportation needs:    Medical: Not on file    Non-medical: Not on file  Tobacco Use  . Smoking status: Never Smoker  . Smokeless tobacco: Never Used  Substance and Sexual Activity  . Alcohol use: Yes    Comment: occ  . Drug use: No  . Sexual activity: Yes    Birth control/protection: Surgical  Lifestyle  . Physical activity:    Days per week: Not on file    Minutes per session: Not on file  . Stress: Not on file  Relationships  . Social connections:    Talks on phone: Not on file    Gets together: Not on file    Attends religious service: Not on file    Active member of club or organization: Not on  file    Attends meetings of clubs or organizations: Not on file    Relationship status: Not on file  Other Topics Concern  . Not on file  Social History Narrative   Regular exercise-yes   Caffeine Use-no   Allergies  Allergen Reactions  . Shellfish Allergy Anaphylaxis    Closes throat  . Codeine Itching  . Menthol Hives  . Morphine And Related Itching  . Penicillins Swelling    Has patient had a PCN reaction causing immediate rash, facial/tongue/throat swelling, SOB or lightheadedness with hypotension: Yes Has patient had a PCN reaction causing severe rash involving mucus membranes or skin necrosis: No Has patient had a PCN reaction that required hospitalization No Has patient had a PCN reaction occurring within the last 10 years: No If all of the above answers are "NO", then may proceed with Cephalosporin use.  [place] [Pseudoephedrine Hcl] Swelling  . Azithromycin Rash  . Erythromycin Rash   Family History  Problem Relation Age of Onset  . Diabetes Mother   . Hypertension Mother   . Heart disease Mother   . Hyperlipidemia Mother   . Diabetes Father   . Hypertension Father   . Heart disease Father   . Hyperlipidemia  Father   . Cervical cancer Sister   . Heart disease Sister   . Diabetes Sister   . Heart disease Sister   . Diabetes Sister      Past medical history, social, surgical and family history all reviewed in electronic medical record.  No pertanent information unless stated regarding to the chief complaint.   Review of Systems:Review of systems updated and as accurate as of 06/08/17  No headache, visual changes, nausea, vomiting, diarrhea, constipation, dizziness, abdominal pain, skin rash, fevers, chills, night sweats, weight loss, swollen lymph nodes, body aches,  muscle aches, chest pain, shortness of breath, mood changes. +joint swelling   Objective  Blood pressure 132/90, pulse 72, height 5' (1.524 m), weight 299 lb (135.6 kg), SpO2 90 %. Systems  examined below as of 06/08/17   General: No apparent distress alert and oriented x3 mood and affect normal, dressed appropriately.  HEENT: Pupils equal, extraocular movements intact  Respiratory: Patient's speak in full sentences and does not appear short of breath  Cardiovascular: No lower extremity edema, non tender, no erythema  Skin: Warm dry intact with no signs of infection or rash on extremities or on axial skeleton.  Abdomen: Soft nontender  Neuro: Cranial nerves II through XII are intact, neurovascularly intact in all extremities with 2+ DTRs and 2+ pulses.  Lymph: No lymphadenopathy of posterior or anterior cervical chain or axillae bilaterally.  Gait mild antalgic  MSK:  Non tender with full range of motion and good stability and symmetric strength and tone of shoulders, elbows, wrist, hip, and ankles bilaterally.  Knee: bilateral  valgus deformity noted. Abnormal thigh to calf ratio.  Tender to palpation over medial and PF joint line.  ROM full in flexion and extension and lower leg rotation. instability with valgus force.  painful patellar compression. Patellar glide with moderate crepitus. Patellar and quadriceps tendons unremarkable. Hamstring and quadriceps strength is normal.   After informed written and verbal consent, patient was seated on exam table. Right knee was prepped with alcohol swab and utilizing anterolateral approach, patient's right knee space was injected with15 mg/2.5 mL of Orthovisc(sodium hyaluronate) in a prefilled syringe was injected easily into the knee through a 22-gauge needle..Patient tolerated the procedure well without immediate complications.  After informed written and verbal consent, patient was seated on exam table. Left knee was prepped with alcohol swab and utilizing anterolateral approach, patient's left knee space was injected with15 mg/2.5 mL of Orthovisc(sodium hyaluronate) in a prefilled syringe was injected easily into the knee through a  22-gauge needle..Patient tolerated the procedure well without immediate complications.    Impression and Recommendations:     This case required medical decision making of moderate complexity.      Note: This dictation was prepared with Dragon dictation along with smaller phrase technology. Any transcriptional errors that result from this process are unintentional.

## 2017-06-08 ENCOUNTER — Ambulatory Visit: Payer: Managed Care, Other (non HMO) | Admitting: Family Medicine

## 2017-06-08 ENCOUNTER — Encounter: Payer: Self-pay | Admitting: Family Medicine

## 2017-06-08 DIAGNOSIS — M170 Bilateral primary osteoarthritis of knee: Secondary | ICD-10-CM

## 2017-06-08 NOTE — Assessment & Plan Note (Signed)
Bilateral injections given today.  Discussed icing regimen and home exercises.  Discussed with activity will follow up in 4 weeks

## 2017-06-08 NOTE — Patient Instructions (Signed)
LAST ONE!!! You get a break from me You know the drill  Stay active See me when you need me

## 2017-07-24 ENCOUNTER — Other Ambulatory Visit: Payer: Self-pay | Admitting: Internal Medicine

## 2017-07-24 DIAGNOSIS — F4323 Adjustment disorder with mixed anxiety and depressed mood: Secondary | ICD-10-CM

## 2017-08-10 ENCOUNTER — Ambulatory Visit (INDEPENDENT_AMBULATORY_CARE_PROVIDER_SITE_OTHER): Payer: Managed Care, Other (non HMO) | Admitting: Internal Medicine

## 2017-08-10 ENCOUNTER — Other Ambulatory Visit (INDEPENDENT_AMBULATORY_CARE_PROVIDER_SITE_OTHER): Payer: Managed Care, Other (non HMO)

## 2017-08-10 ENCOUNTER — Encounter: Payer: Self-pay | Admitting: Internal Medicine

## 2017-08-10 VITALS — BP 110/70 | HR 80 | Temp 97.6°F | Ht 60.0 in | Wt 297.0 lb

## 2017-08-10 DIAGNOSIS — E1169 Type 2 diabetes mellitus with other specified complication: Secondary | ICD-10-CM

## 2017-08-10 DIAGNOSIS — E785 Hyperlipidemia, unspecified: Secondary | ICD-10-CM | POA: Diagnosis not present

## 2017-08-10 DIAGNOSIS — F4323 Adjustment disorder with mixed anxiety and depressed mood: Secondary | ICD-10-CM

## 2017-08-10 DIAGNOSIS — Z0000 Encounter for general adult medical examination without abnormal findings: Secondary | ICD-10-CM | POA: Diagnosis not present

## 2017-08-10 DIAGNOSIS — E11649 Type 2 diabetes mellitus with hypoglycemia without coma: Secondary | ICD-10-CM | POA: Diagnosis not present

## 2017-08-10 DIAGNOSIS — I10 Essential (primary) hypertension: Secondary | ICD-10-CM

## 2017-08-10 LAB — COMPREHENSIVE METABOLIC PANEL
ALBUMIN: 3.8 g/dL (ref 3.5–5.2)
ALK PHOS: 76 U/L (ref 39–117)
ALT: 13 U/L (ref 0–35)
AST: 12 U/L (ref 0–37)
BILIRUBIN TOTAL: 0.7 mg/dL (ref 0.2–1.2)
BUN: 11 mg/dL (ref 6–23)
CO2: 25 mEq/L (ref 19–32)
CREATININE: 0.89 mg/dL (ref 0.40–1.20)
Calcium: 8.9 mg/dL (ref 8.4–10.5)
Chloride: 104 mEq/L (ref 96–112)
GFR: 88.29 mL/min (ref >60.00)
GLUCOSE: 197 mg/dL — AB (ref 70–99)
Potassium: 3.9 mEq/L (ref 3.5–5.1)
SODIUM: 139 meq/L (ref 135–145)
TOTAL PROTEIN: 7 g/dL (ref 6.0–8.3)

## 2017-08-10 LAB — CBC
HCT: 38.6 % (ref 36.0–46.0)
Hemoglobin: 13.6 g/dL (ref 12.0–15.0)
MCHC: 35.3 g/dL (ref 30.0–36.0)
MCV: 83 fl (ref 78.0–100.0)
Platelets: 296 10*3/uL (ref 150.0–400.0)
RBC: 4.65 Mil/uL (ref 3.87–5.11)
RDW: 14 % (ref 11.5–15.5)
WBC: 7.7 10*3/uL (ref 4.0–10.5)

## 2017-08-10 LAB — HEMOGLOBIN A1C: HEMOGLOBIN A1C: 8.8 % — AB (ref 4.6–6.5)

## 2017-08-10 MED ORDER — NEOMYCIN-POLYMYXIN-HC 3.5-10000-1 OT SOLN: 3.0000 [drp] | Freq: Three times a day (TID) | OTIC | 0 refills | Status: DC

## 2017-08-10 MED ORDER — HYDROCHLOROTHIAZIDE 12.5 MG PO CAPS: 12.5000 mg | ORAL_CAPSULE | Freq: Every day | ORAL | 3 refills | Status: DC

## 2017-08-10 MED ORDER — MONTELUKAST SODIUM 10 MG PO TABS: 10.0000 mg | ORAL_TABLET | Freq: Every day | ORAL | 3 refills | Status: DC

## 2017-08-10 MED ORDER — BUPROPION HCL ER (XL) 150 MG PO TB24: 150.0000 mg | ORAL_TABLET | Freq: Every day | ORAL | 3 refills | Status: DC

## 2017-08-10 MED ORDER — ONDANSETRON 4 MG PO TBDP: 4.0000 mg | ORAL_TABLET | Freq: Three times a day (TID) | ORAL | 3 refills | Status: DC | PRN

## 2017-08-10 MED ORDER — ATORVASTATIN CALCIUM 40 MG PO TABS: ORAL_TABLET | ORAL | 3 refills | Status: DC

## 2017-08-10 MED ORDER — LOSARTAN POTASSIUM 50 MG PO TABS: 50.0000 mg | ORAL_TABLET | Freq: Every day | ORAL | 3 refills | Status: DC

## 2017-08-10 NOTE — Assessment & Plan Note (Signed)
Weight is stable from prior. She is working on diet changes with some success.

## 2017-08-10 NOTE — Assessment & Plan Note (Signed)
Off meds. Checking HgA1c, foot exam done today. Need records from eye exam. Complicated by hyperlipidemia.

## 2017-08-10 NOTE — Assessment & Plan Note (Signed)
BP at goal on losartan and hctz and refilled today. Checking CMP and adjust as needed.

## 2017-08-10 NOTE — Progress Notes (Signed)
   Subjective:    Patient ID: Lynn Fields, female    DOB: Oct 13, 1970, 47 y.o.   MRN: [id number]  HPI The patient is a 47 YO female coming in for physical.   PMH, [facility], social history reviewed and updated.   Review of Systems  Constitutional: Negative.   HENT: Negative.   Eyes: Negative.   Respiratory: Negative for cough, chest tightness and shortness of breath.   Cardiovascular: Negative for chest pain, palpitations and leg swelling.  Gastrointestinal: Negative for abdominal distention, abdominal pain, constipation, diarrhea, nausea and vomiting.  Musculoskeletal: Negative.   Skin: Negative.   Neurological: Negative.   Psychiatric/Behavioral: Negative.       Objective:   Physical Exam  Constitutional: She is oriented to person, place, and time. She appears well-developed and well-nourished.  overweight  HENT:  Head: Normocephalic and atraumatic.  Eyes: EOM are normal.  Neck: Normal range of motion.  Cardiovascular: Normal rate and regular rhythm.  Pulmonary/Chest: Effort normal and breath sounds normal. No respiratory distress. She has no wheezes. She has no rales.  Abdominal: Soft. Bowel sounds are normal. She exhibits no distension. There is no tenderness. There is no rebound.  Musculoskeletal: She exhibits no edema.  Neurological: She is alert and oriented to person, place, and time. Coordination normal.  Skin: Skin is warm and dry.  Psychiatric: She has a normal mood and affect.   Vitals:   08/10/17 0809  BP: 110/70  Pulse: 80  Temp: 97.6 F (36.4 C)  TempSrc: Oral  SpO2: 98%  Weight: 297 lb (134.7 kg)  Height: 5' (1.524 m)      Assessment & Plan:

## 2017-08-10 NOTE — Assessment & Plan Note (Signed)
Refill wellbutrin which is working well currently.

## 2017-08-10 NOTE — Patient Instructions (Signed)
We have sent in singulair which is the allergy medicine to take daily.   We have sent in cortisporin ear drops to use 3 drops 3 times per day for 3 days.   We will check the labs today and call you back.  Health Maintenance, Female Adopting a healthy lifestyle and getting preventive care can go a long way to promote health and wellness. Talk with your health care provider about what schedule of regular examinations is right for you. This is a good chance for you to check in with your provider about disease prevention and staying healthy. In between checkups, there are plenty of things you can do on your own. Experts have done a lot of research about which lifestyle changes and preventive measures are most likely to keep you healthy. Ask your health care provider for more information. Weight and diet Eat a healthy diet  Be sure to include plenty of vegetables, fruits, low-fat dairy products, and lean protein.  Do not eat a lot of foods high in solid fats, added sugars, or salt.  Get regular exercise. This is one of the most important things you can do for your health. ? Most adults should exercise for at least 150 minutes each week. The exercise should increase your heart rate and make you sweat (moderate-intensity exercise). ? Most adults should also do strengthening exercises at least twice a week. This is in addition to the moderate-intensity exercise.  Maintain a healthy weight  Body mass index (BMI) is a measurement that can be used to identify possible weight problems. It estimates body fat based on height and weight. Your health care provider can help determine your BMI and help you achieve or maintain a healthy weight.  For females 72 years of age and older: ? A BMI below 18.5 is considered underweight. ? A BMI of 18.5 to 24.9 is normal. ? A BMI of 25 to 29.9 is considered overweight. ? A BMI of 30 and above is considered obese.  Watch levels of cholesterol and blood  lipids  You should start having your blood tested for lipids and cholesterol at 45 years of age, then have this test every 5 years.  You may need to have your cholesterol levels checked more often if: ? Your lipid or cholesterol levels are high. ? You are older than 45 years of age. ? You are at high risk for heart disease.  Cancer screening Lung Cancer  Lung cancer screening is recommended for adults 44-31 years old who are at high risk for lung cancer because of a history of smoking.  A yearly low-dose CT scan of the lungs is recommended for people who: ? Currently smoke. ? Have quit within the past 15 years. ? Have at least a 30-pack-year history of smoking. A pack year is smoking an average of one pack of cigarettes a day for 1 year.  Yearly screening should continue until it has been 15 years since you quit.  Yearly screening should stop if you develop a health problem that would prevent you from having lung cancer treatment.  Breast Cancer  Practice breast self-awareness. This means understanding how your breasts normally appear and feel.  It also means doing regular breast self-exams. Let your health care provider know about any changes, no matter how small.  If you are in your 20s or 30s, you should have a clinical breast exam (CBE) by a health care provider every 1-3 years as part of a regular health exam.  If you are 40 or older, have a CBE every year. Also consider having a breast X-ray (mammogram) every year.  If you have a family history of breast cancer, talk to your health care provider about genetic screening.  If you are at high risk for breast cancer, talk to your health care provider about having an MRI and a mammogram every year.  Breast cancer gene (BRCA) assessment is recommended for women who have family members with BRCA-related cancers. BRCA-related cancers include: ? Breast. ? Ovarian. ? Tubal. ? Peritoneal cancers.  Results of the assessment will  determine the need for genetic counseling and BRCA1 and BRCA2 testing.  Cervical Cancer Your health care provider may recommend that you be screened regularly for cancer of the pelvic organs (ovaries, uterus, and vagina). This screening involves a pelvic examination, including checking for microscopic changes to the surface of your cervix (Pap test). You may be encouraged to have this screening done every 3 years, beginning at age 78.  For women ages 39-65, health care providers may recommend pelvic exams and Pap testing every 3 years, or they may recommend the Pap and pelvic exam, combined with testing for human papilloma virus (HPV), every 5 years. Some types of HPV increase your risk of cervical cancer. Testing for HPV may also be done on women of any age with unclear Pap test results.  Other health care providers may not recommend any screening for nonpregnant women who are considered low risk for pelvic cancer and who do not have symptoms. Ask your health care provider if a screening pelvic exam is right for you.  If you have had past treatment for cervical cancer or a condition that could lead to cancer, you need Pap tests and screening for cancer for at least 20 years after your treatment. If Pap tests have been discontinued, your risk factors (such as having a new sexual partner) need to be reassessed to determine if screening should resume. Some women have medical problems that increase the chance of getting cervical cancer. In these cases, your health care provider may recommend more frequent screening and Pap tests.  Colorectal Cancer  This type of cancer can be detected and often prevented.  Routine colorectal cancer screening usually begins at 45 years of age and continues through 45 years of age.  Your health care provider may recommend screening at an earlier age if you have risk factors for colon cancer.  Your health care provider may also recommend using home test kits to check  for hidden blood in the stool.  A small camera at the end of a tube can be used to examine your colon directly (sigmoidoscopy or colonoscopy). This is done to check for the earliest forms of colorectal cancer.  Routine screening usually begins at age 71.  Direct examination of the colon should be repeated every 5-10 years through 45 years of age. However, you may need to be screened more often if early forms of precancerous polyps or small growths are found.  Skin Cancer  Check your skin from head to toe regularly.  Tell your health care provider about any new moles or changes in moles, especially if there is a change in a mole's shape or color.  Also tell your health care provider if you have a mole that is larger than the size of a pencil eraser.  Always use sunscreen. Apply sunscreen liberally and repeatedly throughout the day.  Protect yourself by wearing long sleeves, pants, a wide-brimmed hat, and  sunglasses whenever you are outside.  Heart disease, diabetes, and high blood pressure  High blood pressure causes heart disease and increases the risk of stroke. High blood pressure is more likely to develop in: ? People who have blood pressure in the high end of the normal range (130-139/85-89 mm Hg). ? People who are overweight or obese. ? People who are African American.  If you are 22-80 years of age, have your blood pressure checked every 3-5 years. If you are 14 years of age or older, have your blood pressure checked every year. You should have your blood pressure measured twice-once when you are at a hospital or clinic, and once when you are not at a hospital or clinic. Record the average of the two measurements. To check your blood pressure when you are not at a hospital or clinic, you can use: ? An automated blood pressure machine at a pharmacy. ? A home blood pressure monitor.  If you are between 19 years and 5 years old, ask your health care provider if you should take  aspirin to prevent strokes.  Have regular diabetes screenings. This involves taking a blood sample to check your fasting blood sugar level. ? If you are at a normal weight and have a low risk for diabetes, have this test once every three years after 45 years of age. ? If you are overweight and have a high risk for diabetes, consider being tested at a younger age or more often. Preventing infection Hepatitis B  If you have a higher risk for hepatitis B, you should be screened for this virus. You are considered at high risk for hepatitis B if: ? You were born in a country where hepatitis B is common. Ask your health care provider which countries are considered high risk. ? Your parents were born in a high-risk country, and you have not been immunized against hepatitis B (hepatitis B vaccine). ? You have HIV or AIDS. ? You use needles to inject street drugs. ? You live with someone who has hepatitis B. ? You have had sex with someone who has hepatitis B. ? You get hemodialysis treatment. ? You take certain medicines for conditions, including cancer, organ transplantation, and autoimmune conditions.  Hepatitis C  Blood testing is recommended for: ? Everyone born from [birth year] through 1963. ? Anyone with known risk factors for hepatitis C.  Sexually transmitted infections (STIs)  You should be screened for sexually transmitted infections (STIs) including gonorrhea and chlamydia if: ? You are sexually active and are younger than 45 years of age. ? You are older than 45 years of age and your health care provider tells you that you are at risk for this type of infection. ? Your sexual activity has changed since you were last screened and you are at an increased risk for chlamydia or gonorrhea. Ask your health care provider if you are at risk.  If you do not have HIV, but are at risk, it may be recommended that you take a prescription medicine daily to prevent HIV infection. This is called  pre-exposure prophylaxis (PrEP). You are considered at risk if: ? You are sexually active and do not regularly use condoms or know the HIV status of your partner(s). ? You take drugs by injection. ? You are sexually active with a partner who has HIV.  Talk with your health care provider about whether you are at high risk of being infected with HIV. If you choose to begin PrEP, you  should first be tested for HIV. You should then be tested every 3 months for as long as you are taking PrEP. Pregnancy  If you are premenopausal and you may become pregnant, ask your health care provider about preconception counseling.  If you may become pregnant, take 400 to 800 micrograms (mcg) of folic acid every day.  If you want to prevent pregnancy, talk to your health care provider about birth control (contraception). Osteoporosis and menopause  Osteoporosis is a disease in which the bones lose minerals and strength with aging. This can result in serious bone fractures. Your risk for osteoporosis can be identified using a bone density scan.  If you are 69 years of age or older, or if you are at risk for osteoporosis and fractures, ask your health care provider if you should be screened.  Ask your health care provider whether you should take a calcium or vitamin D supplement to lower your risk for osteoporosis.  Menopause may have certain physical symptoms and risks.  Hormone replacement therapy may reduce some of these symptoms and risks. Talk to your health care provider about whether hormone replacement therapy is right for you. Follow these instructions at home:  Schedule regular health, dental, and eye exams.  Stay current with your immunizations.  Do not use any tobacco products including cigarettes, chewing tobacco, or electronic cigarettes.  If you are pregnant, do not drink alcohol.  If you are breastfeeding, limit how much and how often you drink alcohol.  Limit alcohol intake to no more  than 1 drink per day for nonpregnant women. One drink equals 12 ounces of beer, 5 ounces of wine, or 1 ounces of hard liquor.  Do not use street drugs.  Do not share needles.  Ask your health care provider for help if you need support or information about quitting drugs.  Tell your health care provider if you often feel depressed.  Tell your health care provider if you have ever been abused or do not feel safe at home. This information is not intended to replace advice given to you by your health care provider. Make sure you discuss any questions you have with your health care provider. Document Released: 07/29/2010 Document Revised: 06/21/2015 Document Reviewed: 10/17/2014 Elsevier Interactive Patient Education  [employer].

## 2017-08-10 NOTE — Assessment & Plan Note (Signed)
Refilled lipitor 40 mg daily. Recent lipid panel at goal of LDL <100.

## 2017-08-10 NOTE — Assessment & Plan Note (Signed)
Need eye exam records, foot exam done today. Gets yearly flu shot, pneumonia up to date until 43. Pap smear and mammogram with gyn. Tetanus up to date. Given screening recommendations. Counseled about dangers of distracted driving.

## 2017-10-10 ENCOUNTER — Encounter (HOSPITAL_COMMUNITY): Payer: Self-pay

## 2017-10-10 ENCOUNTER — Emergency Department (HOSPITAL_COMMUNITY)
Admission: EM | Admit: 2017-10-10 | Discharge: 2017-10-10 | Disposition: A | Discharge status: Home / Self Care | Payer: Managed Care, Other (non HMO) | Attending: Emergency Medicine | Admitting: Emergency Medicine

## 2017-10-10 ENCOUNTER — Other Ambulatory Visit: Payer: Self-pay

## 2017-10-10 DIAGNOSIS — I10 Essential (primary) hypertension: Secondary | ICD-10-CM | POA: Insufficient documentation

## 2017-10-10 DIAGNOSIS — E119 Type 2 diabetes mellitus without complications: Secondary | ICD-10-CM | POA: Insufficient documentation

## 2017-10-10 DIAGNOSIS — T7840XA Allergy, unspecified, initial encounter: Secondary | ICD-10-CM

## 2017-10-10 DIAGNOSIS — R21 Rash and other nonspecific skin eruption: Secondary | ICD-10-CM | POA: Diagnosis present

## 2017-10-10 MED ORDER — DIPHENHYDRAMINE HCL 25 MG PO CAPS
50.0000 mg | ORAL_CAPSULE | Freq: Once | ORAL | Status: AC
  Administered 2017-10-10: 50 mg via ORAL
  Filled 2017-10-10: qty 2

## 2017-10-10 MED ORDER — FAMOTIDINE 20 MG PO TABS
20.0000 mg | ORAL_TABLET | Freq: Once | ORAL | Status: AC
  Administered 2017-10-10: 20 mg via ORAL
  Filled 2017-10-10: qty 1

## 2017-10-10 MED ORDER — FAMOTIDINE 20 MG PO TABS: 20.0000 mg | ORAL_TABLET | Freq: Two times a day (BID) | ORAL | 0 refills | Status: DC

## 2017-10-10 MED ORDER — DEXAMETHASONE SODIUM PHOSPHATE 10 MG/ML IJ SOLN
10.0000 mg | Freq: Once | INTRAMUSCULAR | Status: AC
Start: 2017-10-10 — End: 2017-10-10
  Administered 2017-10-10: 10 mg via INTRAMUSCULAR
  Filled 2017-10-10: qty 1

## 2017-10-10 MED ORDER — MUPIROCIN CALCIUM 2 % EX CREA: 1.0000 "application " | TOPICAL_CREAM | Freq: Two times a day (BID) | CUTANEOUS | 0 refills | Status: DC

## 2017-10-10 NOTE — ED Provider Notes (Signed)
[facility] DEPT Provider Note   CSN: [id number] Arrival date & time: 10/10/17  0425     History   Chief Complaint Chief Complaint  Patient presents with  . Allergic Reaction    HPI Lynn Fields is a 47 y.o. female.  The history is provided by the patient.  Allergic Reaction  Presenting symptoms: itching and rash   Presenting symptoms: no difficulty swallowing and no wheezing   Severity:  Mild Duration:  5 days Prior allergic episodes:  Food/nut allergies Context comment:  Hair dye Relieved by:  Nothing Worsened by:  Nothing Ineffective treatments:  Antihistamines Unknown type of black hair dye.  Now itching and oozing of scalp.    Past Medical History:  Diagnosis Date  . Arthritis    knees  . Depression   . Diabetes mellitus without complication ([place])   . Hypertension   . Migraines   . Ulcer     Patient Active Problem List   Diagnosis Date Noted  . Hyperlipidemia associated with type 2 diabetes mellitus ([place]) 08/10/2017  . Allergic reaction to food 02/04/2017  . Adjustment disorder with mixed anxiety and depressed mood 08/07/2016  . Degenerative arthritis of knee, bilateral 02/11/2016  . Routine general medical examination at a health care facility 08/16/2015  . Unspecified sleep apnea 06/02/2013  . Essential hypertension 06/14/2012  . Controlled type 2 diabetes mellitus with hypoglycemia ([place]) 06/14/2012  . Obesity, morbid ([place]) 06/14/2012    Past Surgical History:  Procedure Laterality Date  . CESAREAN SECTION  1993  . HYSTEROSCOPY WITH NOVASURE N/A 06/02/2016   Procedure: HYSTEROSCOPY WITH NOVASURE;  Surgeon: Paula Compton, MD;  Location: [facility];  Service: Gynecology;  Laterality: N/A;  . TUBAL LIGATION  1993     OB History   None      Home Medications    Prior to Admission medications   Medication Sig Start Date End Date Taking? Authorizing Provider  acetaminophen (TYLENOL) 500 MG tablet Take 1,000 mg by  mouth every 6 (six) hours as needed for headache.   Yes [provider]  atorvastatin (LIPITOR) 40 MG tablet TAKE 1 TABLET BY MOUTH ONCE DAILY AT  6  PM Patient taking differently: Take 40 mg by mouth daily at 6 PM.  08/10/17  Yes Hoyt Koch, MD  buPROPion (WELLBUTRIN XL) 150 MG 24 hr tablet Take 1 tablet (150 mg total) by mouth daily. 08/10/17  Yes Hoyt Koch, MD  CINNAMON PO Take 1 tablet by mouth daily.   Yes [provider]  hydrochlorothiazide (MICROZIDE) 12.5 MG capsule Take 1 capsule (12.5 mg total) by mouth daily. 08/10/17  Yes Hoyt Koch, MD  hydroxypropyl methylcellulose / hypromellose (ISOPTO TEARS / GONIOVISC) 2.5 % ophthalmic solution Place 1 drop into both eyes as needed for dry eyes.   Yes [provider]  losartan (COZAAR) 50 MG tablet Take 1 tablet (50 mg total) by mouth daily. 08/10/17  Yes Hoyt Koch, MD  montelukast (SINGULAIR) 10 MG tablet Take 1 tablet (10 mg total) by mouth at bedtime. Patient taking differently: Take 10 mg by mouth at bedtime as needed. Allergies 08/10/17  Yes Hoyt Koch, MD  Multiple Vitamins-Minerals (ESSENTIA PO) Take 2,000 Units by mouth daily.   Yes [provider]  neomycin-polymyxin-hydrocortisone (CORTISPORIN) OTIC solution Place 3 drops into the left ear 3 (three) times daily. 08/10/17  Yes Hoyt Koch, MD  TURMERIC PO Take 1 tablet by mouth daily.   Yes [provider]  EPINEPHrine 0.3 mg/0.3 mL IJ SOAJ injection Inject 0.3 mg into the muscle once as needed for anaphylaxis. 01/29/17   [provider]  ondansetron (ZOFRAN ODT) 4 MG disintegrating tablet Take 1 tablet (4 mg total) by mouth every 8 (eight) hours as needed for nausea or vomiting. Patient not taking: Reported on 10/10/2017 08/10/17   Hoyt Koch, MD    Family History Family History  Problem Relation Age of Onset  . Diabetes Mother   . Hypertension Mother   . Heart  disease Mother   . Hyperlipidemia Mother   . Diabetes Father   . Hypertension Father   . Heart disease Father   . Hyperlipidemia Father   . Cervical cancer Sister   . Heart disease Sister   . Diabetes Sister   . Heart disease Sister   . Diabetes Sister     Social History Social History   Tobacco Use  . Smoking status: Never Smoker  . Smokeless tobacco: Never Used  Substance Use Topics  . Alcohol use: Yes    Comment: occ  . Drug use: No     Allergies   Shellfish allergy; Codeine; Menthol; Morphine and related; Penicillins; Sudafed [pseudoephedrine hcl]; Azithromycin; and Erythromycin   Review of Systems Review of Systems  Constitutional: Negative for diaphoresis.  HENT: Negative for congestion, facial swelling, trouble swallowing and voice change.   Respiratory: Negative for shortness of breath, wheezing and stridor.   Cardiovascular: Negative for chest pain.  Skin: Positive for itching and rash.  All other systems reviewed and are negative.    Physical Exam Updated Vital Signs BP (!) 138/95 (BP Location: Left Arm)   Pulse 95   Temp 98.7 F (37.1 C) (Oral)   Resp 20   Ht 5' (1.524 m)   Wt 129.7 kg   SpO2 100%   BMI 55.86 kg/m   Physical Exam  Constitutional: She is oriented to person, place, and time. She appears well-developed and well-nourished. No distress.  HENT:  Head: Normocephalic and atraumatic.  Papular eruption of scalp with scant oozing of serous fluid at crown.  No facial swelling  Eyes: Pupils are equal, round, and reactive to light. Conjunctivae are normal.  Neck: Normal range of motion. Neck supple.  Cardiovascular: Normal rate, regular rhythm, normal heart sounds and intact distal pulses.  Pulmonary/Chest: Effort normal and breath sounds normal. No stridor. She has no wheezes. She has no rales.  Abdominal: Soft. Bowel sounds are normal. She exhibits no mass. There is no tenderness. There is no rebound and no guarding.  Musculoskeletal:  Normal range of motion.  Neurological: She is alert and oriented to person, place, and time.  Skin: Skin is warm and dry. Capillary refill takes less than 2 seconds.  Psychiatric: She has a normal mood and affect.     ED Treatments / Results  Labs (all labs ordered are listed, but only abnormal results are displayed) Labs Reviewed - No data to display  EKG None  Radiology No results found.  Procedures Procedures (including critical care time)  Medications Ordered in ED Medications  dexamethasone (DECADRON) injection 10 mg (has no administration in time range)  famotidine (PEPCID) tablet 20 mg (has no administration in time range)  diphenhydrAMINE (BENADRYL) capsule 50 mg (has no administration in time range)      Will prescribe mupiricin as is oozing to prevent secondary infection.  Continue benadryl.    Return for fevers >100.4 unrelieved by medication, shortness of breath, intractable  vomiting, or diarrhea, Inability to tolerate liquids or food, cough, altered mental status or any concerns. No signs of systemic illness or infection. The patient is nontoxic-appearing on exam and vital signs are within normal limits.   I have reviewed the triage vital signs and the nursing notes. Pertinent labs &imaging results that were available during my care of the patient were reviewed by me and considered in my medical decision making (see chart for details).  After history, exam, and medical workup I feel the patient has been appropriately medically screened and is safe for discharge home. Pertinent diagnoses were discussed with the patient. Patient was given return precautions.   Jiles Goya, MD 10/10/17 [phone]

## 2017-10-10 NOTE — ED Triage Notes (Signed)
Pt presents to ED from home for allergic reaction. Pt had her hair dyed 4 days aog, and develop da rash in her scalp. Pt reports that the irritation went down into her neck, and now her R eye. Pt attempted several doses of benadryl with minimal relief.

## 2017-10-18 ENCOUNTER — Encounter: Payer: Self-pay | Admitting: Internal Medicine

## 2017-10-19 ENCOUNTER — Emergency Department (HOSPITAL_COMMUNITY)
Admission: EM | Admit: 2017-10-19 | Discharge: 2017-10-19 | Disposition: A | Discharge status: Home / Self Care | Payer: Managed Care, Other (non HMO) | Attending: Emergency Medicine | Admitting: Emergency Medicine

## 2017-10-19 ENCOUNTER — Encounter (HOSPITAL_COMMUNITY): Payer: Self-pay | Admitting: Emergency Medicine

## 2017-10-19 DIAGNOSIS — I10 Essential (primary) hypertension: Secondary | ICD-10-CM | POA: Insufficient documentation

## 2017-10-19 DIAGNOSIS — T7840XA Allergy, unspecified, initial encounter: Secondary | ICD-10-CM | POA: Diagnosis not present

## 2017-10-19 DIAGNOSIS — E119 Type 2 diabetes mellitus without complications: Secondary | ICD-10-CM | POA: Diagnosis not present

## 2017-10-19 DIAGNOSIS — L299 Pruritus, unspecified: Secondary | ICD-10-CM | POA: Diagnosis present

## 2017-10-19 DIAGNOSIS — Z79899 Other long term (current) drug therapy: Secondary | ICD-10-CM | POA: Diagnosis not present

## 2017-10-19 MED ORDER — HYDROXYZINE HCL 25 MG PO TABS: 25.0000 mg | ORAL_TABLET | Freq: Four times a day (QID) | ORAL | 0 refills | Status: DC

## 2017-10-19 MED ORDER — METHYLPREDNISOLONE SODIUM SUCC 125 MG IJ SOLR
125.0000 mg | Freq: Once | INTRAMUSCULAR | Status: AC
  Administered 2017-10-19: 125 mg via INTRAVENOUS
  Filled 2017-10-19: qty 2

## 2017-10-19 MED ORDER — LORATADINE 10 MG PO TABS: 10.0000 mg | ORAL_TABLET | Freq: Every day | ORAL | 0 refills | Status: DC

## 2017-10-19 MED ORDER — DIPHENHYDRAMINE HCL 50 MG/ML IJ SOLN
25.0000 mg | Freq: Once | INTRAMUSCULAR | Status: AC
  Administered 2017-10-19: 25 mg via INTRAVENOUS
  Filled 2017-10-19: qty 1

## 2017-10-19 MED ORDER — FAMOTIDINE IN NACL 20-0.9 MG/50ML-% IV SOLN
20.0000 mg | Freq: Once | INTRAVENOUS | Status: AC
  Administered 2017-10-19: 20 mg via INTRAVENOUS
  Filled 2017-10-19: qty 50

## 2017-10-19 NOTE — ED Triage Notes (Signed)
Per pt, states she was treated for an allergic reaction to shell fish which was in her hair dye on 9/14-states she is still itching and having pain all over-she is getting no relief from meds

## 2017-10-19 NOTE — Discharge Instructions (Addendum)
Stop the Benadryl and take the Atarax and Claritin. Continue the Pepcid. Follow up with your doctor. Return here as needed.

## 2017-10-19 NOTE — ED Provider Notes (Signed)
Lynn Fields Provider Note   CSN: [id number] Arrival date & time: 10/19/17  1508     History   Chief Complaint Chief Complaint  Patient presents with  . Allergic Reaction    HPI Lynn Fields is a 47 y.o. female who present to the ED with itching after exposure to shellfish. Patient reports being here last week for same but was exposed again. Patient reports the itching never really went away with the medications she was taking and usually she needs medications by IV.  HPI  Past Medical History:  Diagnosis Date  . Arthritis    knees  . Depression   . Diabetes mellitus without complication ([place])   . Hypertension   . Migraines   . Ulcer     Patient Active Problem List   Diagnosis Date Noted  . Hyperlipidemia associated with type 2 diabetes mellitus ([place]) 08/10/2017  . Allergic reaction to food 02/04/2017  . Adjustment disorder with mixed anxiety and depressed mood 08/07/2016  . Degenerative arthritis of knee, bilateral 02/11/2016  . Routine general medical examination at a health care facility 08/16/2015  . Unspecified sleep apnea 06/02/2013  . Essential hypertension 06/14/2012  . Controlled type 2 diabetes mellitus with hypoglycemia ([place]) 06/14/2012  . Obesity, morbid ([place]) 06/14/2012    Past Surgical History:  Procedure Laterality Date  . CESAREAN SECTION  1993  . HYSTEROSCOPY WITH NOVASURE N/A 06/02/2016   Procedure: HYSTEROSCOPY WITH NOVASURE;  Surgeon: Paula Compton, MD;  Location: [facility];  Service: Gynecology;  Laterality: N/A;  . TUBAL LIGATION  1993     OB History   None      Home Medications    Prior to Admission medications   Medication Sig Start Date End Date Taking? Authorizing Provider  acetaminophen (TYLENOL) 500 MG tablet Take 1,000 mg by mouth every 6 (six) hours as needed for headache.   Yes [provider]  atorvastatin (LIPITOR) 40 MG tablet TAKE 1 TABLET BY MOUTH ONCE DAILY AT  6  PM Patient  taking differently: Take 40 mg by mouth daily at 6 PM.  08/10/17  Yes Hoyt Koch, MD  buPROPion (WELLBUTRIN XL) 150 MG 24 hr tablet Take 1 tablet (150 mg total) by mouth daily. 08/10/17  Yes Hoyt Koch, MD  CINNAMON PO Take 1 tablet by mouth daily.   Yes [provider]  DIPHENHYDRAMINE HCL, TOPICAL, (BENADRYL ITCH STOPPING) 2 % GEL Apply 1 application topically as needed.   Yes [provider]  famotidine (PEPCID) 20 MG tablet Take 1 tablet (20 mg total) by mouth 2 (two) times daily. 10/10/17  Yes Palumbo, April, MD  hydrochlorothiazide (MICROZIDE) 12.5 MG capsule Take 1 capsule (12.5 mg total) by mouth daily. 08/10/17  Yes Hoyt Koch, MD  losartan (COZAAR) 50 MG tablet Take 1 tablet (50 mg total) by mouth daily. 08/10/17  Yes Hoyt Koch, MD  mupirocin cream (BACTROBAN) 2 % Apply 1 application topically 2 (two) times daily. 10/10/17  Yes Palumbo, April, MD  ondansetron (ZOFRAN ODT) 4 MG disintegrating tablet Take 1 tablet (4 mg total) by mouth every 8 (eight) hours as needed for nausea or vomiting. 08/10/17  Yes Hoyt Koch, MD  TURMERIC PO Take 1 tablet by mouth daily.   Yes [provider]  EPINEPHrine 0.3 mg/0.3 mL IJ SOAJ injection Inject 0.3 mg into the muscle once as needed for anaphylaxis. 01/29/17   [provider]  hydrOXYzine (ATARAX/VISTARIL) 25 MG tablet Take 1  tablet (25 mg total) by mouth every 6 (six) hours. 10/19/17   Ashley Murrain, NP  loratadine (CLARITIN) 10 MG tablet Take 1 tablet (10 mg total) by mouth daily. 10/19/17   Ashley Murrain, NP    Family History Family History  Problem Relation Age of Onset  . Diabetes Mother   . Hypertension Mother   . Heart disease Mother   . Hyperlipidemia Mother   . Diabetes Father   . Hypertension Father   . Heart disease Father   . Hyperlipidemia Father   . Cervical cancer Sister   . Heart disease Sister   . Diabetes Sister   . Heart disease Sister   .  Diabetes Sister     Social History Social History   Tobacco Use  . Smoking status: Never Smoker  . Smokeless tobacco: Never Used  Substance Use Topics  . Alcohol use: Yes    Comment: occ  . Drug use: No     Allergies   Other; Shellfish allergy; Codeine; Menthol; Morphine and related; Penicillins; Sudafed [pseudoephedrine hcl]; Azithromycin; and Erythromycin   Review of Systems Review of Systems  Skin: Positive for rash.       itching  All other systems reviewed and are negative.    Physical Exam Updated Vital Signs BP (!) 156/118 (BP Location: Left Arm) Comment: RN NOTIFIED  Pulse (!) 103   Temp 98.9 F (37.2 C) (Oral)   Resp 17   Ht 5' (1.524 m)   Wt 122.5 kg   SpO2 98%   BMI 52.74 kg/m   Physical Exam  Constitutional: She appears well-developed and well-nourished. No distress.  HENT:  Head: Normocephalic.  Eyes: Conjunctivae and EOM are normal.  Neck: Neck supple.  Cardiovascular: Normal rate and regular rhythm.  Pulmonary/Chest: Effort normal. No respiratory distress. She has no wheezes. She has no rales.  Musculoskeletal: Normal range of motion.  Neurological: She is alert.  Skin: Skin is warm and dry. Rash noted.  Psychiatric: She has a normal mood and affect.  Nursing note and vitals reviewed.    ED Treatments / Results  Labs (all labs ordered are listed, but only abnormal results are displayed) Labs Reviewed - No data to display  Radiology No results found.  Procedures Procedures (including critical care time)  Medications Ordered in ED Medications  methylPREDNISolone sodium succinate (SOLU-MEDROL) 125 mg/2 mL injection 125 mg (125 mg Intravenous Given 10/19/17 1748)  famotidine (PEPCID) IVPB 20 mg premix (0 mg Intravenous Stopped 10/19/17 1825)  diphenhydrAMINE (BENADRYL) injection 25 mg (25 mg Intravenous Given 10/19/17 1750)   Patient reports symptoms have resolved with treatment in the ED. Will d/c home with PO medications and she will  f/u with her PCP. Patient will stop Benadryl and take Atarax and she will continue the Pepcid.   Initial Impression / Assessment and Plan / ED Course  I have reviewed the triage vital signs and the nursing notes.   Final Clinical Impressions(s) / ED Diagnoses   Final diagnoses:  Allergic reaction, initial encounter    ED Discharge Orders         Ordered    hydrOXYzine (ATARAX/VISTARIL) 25 MG tablet  Every 6 hours     10/19/17 1845    loratadine (CLARITIN) 10 MG tablet  Daily     10/19/17 1845           Debroah Baller [place], NP 10/19/17 1850    Lacretia Leigh, MD 10/19/17 2339

## 2017-11-10 ENCOUNTER — Encounter: Payer: Self-pay | Admitting: Family Medicine

## 2017-11-11 ENCOUNTER — Encounter: Payer: Self-pay | Admitting: Family Medicine

## 2017-11-20 ENCOUNTER — Ambulatory Visit (INDEPENDENT_AMBULATORY_CARE_PROVIDER_SITE_OTHER): Payer: Managed Care, Other (non HMO) | Admitting: *Deleted

## 2017-11-20 DIAGNOSIS — Z23 Encounter for immunization: Secondary | ICD-10-CM | POA: Diagnosis not present

## 2017-12-10 ENCOUNTER — Ambulatory Visit: Payer: Managed Care, Other (non HMO) | Admitting: Family Medicine

## 2017-12-10 ENCOUNTER — Encounter: Payer: Self-pay | Admitting: Family Medicine

## 2017-12-10 DIAGNOSIS — M170 Bilateral primary osteoarthritis of knee: Secondary | ICD-10-CM

## 2017-12-10 NOTE — Assessment & Plan Note (Signed)
Bilateral knee injections given today.  Did do very well with 6 months of relief with the Visco supplementation and will try to get approval again with it being greater than 6 months.   Exercise, which activities to do which wants to avoid.  Patient is to increase activity slowly over the course of next several days.  Follow-up with me again in 4 to 6 weeks

## 2017-12-10 NOTE — Patient Instructions (Addendum)
Did them again  We will get the other ones approved.  See you on the Tuesday

## 2017-12-10 NOTE — Progress Notes (Signed)
[name] [address] [place], [place] [address] Phone: [phone] Subjective:    I Lynn Fields am serving as a Education administrator for Dr. Hulan Saas.   CC: Bilateral knee injections  [id number]  Lynn Fields is a 47 y.o. female coming in with complaint of bilateral knee pain. States that her knees feel horrible. Would like bilateral injections.  Patient has severe arthritic changes of the knees.  States that the pain is even hurting her at night.  Patient did have Visco supplementation 6 months ago.  However even having trouble walking greater than 200 feet      Past Medical History:  Diagnosis Date  . Arthritis    knees  . Depression   . Diabetes mellitus without complication ([place])   . Hypertension   . Migraines   . Ulcer    Past Surgical History:  Procedure Laterality Date  . CESAREAN SECTION  1993  . HYSTEROSCOPY WITH NOVASURE N/A 06/02/2016   Procedure: HYSTEROSCOPY WITH NOVASURE;  Surgeon: Paula Compton, MD;  Location: [facility];  Service: Gynecology;  Laterality: N/A;  . TUBAL LIGATION  1993   Social History   Socioeconomic History  . Marital status: Married    Spouse name: Not on file  . Number of children: Not on file  . Years of education: 47  . Highest education level: Not on file  Occupational History  . Occupation: Product manager: [employer]  . Financial resource strain: Not on file  . Food insecurity:    Worry: Not on file    Inability: Not on file  . Transportation needs:    Medical: Not on file    Non-medical: Not on file  Tobacco Use  . Smoking status: Never Smoker  . Smokeless tobacco: Never Used  Substance and Sexual Activity  . Alcohol use: Yes    Comment: occ  . Drug use: No  . Sexual activity: Yes    Birth control/protection: Surgical  Lifestyle  . Physical activity:    Days per week: Not on file    Minutes per session: Not on file  . Stress: Not on file    Relationships  . Social connections:    Talks on phone: Not on file    Gets together: Not on file    Attends religious service: Not on file    Active member of club or organization: Not on file    Attends meetings of clubs or organizations: Not on file    Relationship status: Not on file  Other Topics Concern  . Not on file  Social History Narrative   Regular exercise-yes   Caffeine Use-no   Allergies  Allergen Reactions  . Other Anaphylaxis    Hair dye  . Shellfish Allergy Anaphylaxis    Closes throat  . Codeine Itching  . Menthol Hives  . Morphine And Related Itching  . Penicillins Swelling    Has patient had a PCN reaction causing immediate rash, facial/tongue/throat swelling, SOB or lightheadedness with hypotension: Yes Has patient had a PCN reaction causing severe rash involving mucus membranes or skin necrosis: No Has patient had a PCN reaction that required hospitalization No Has patient had a PCN reaction occurring within the last 10 years: No If all of the above answers are "NO", then may proceed with Cephalosporin use.  [place] [Pseudoephedrine Hcl] Swelling  . Azithromycin Rash  . Erythromycin Rash   Family History  Problem  Relation Age of Onset  . Diabetes Mother   . Hypertension Mother   . Heart disease Mother   . Hyperlipidemia Mother   . Diabetes Father   . Hypertension Father   . Heart disease Father   . Hyperlipidemia Father   . Cervical cancer Sister   . Heart disease Sister   . Diabetes Sister   . Heart disease Sister   . Diabetes Sister      Current Outpatient Medications (Cardiovascular):  .  atorvastatin (LIPITOR) 40 MG tablet, TAKE 1 TABLET BY MOUTH ONCE DAILY AT  6  PM (Patient taking differently: Take 40 mg by mouth daily at 6 PM. ) .  EPINEPHrine 0.3 mg/0.3 mL IJ SOAJ injection, Inject 0.3 mg into the muscle once as needed for anaphylaxis. .  hydrochlorothiazide (MICROZIDE) 12.5 MG capsule, Take 1 capsule (12.5 mg total) by mouth  daily. Marland Kitchen  losartan (COZAAR) 50 MG tablet, Take 1 tablet (50 mg total) by mouth daily.  Current Outpatient Medications (Respiratory):  .  loratadine (CLARITIN) 10 MG tablet, Take 1 tablet (10 mg total) by mouth daily.  Current Outpatient Medications (Analgesics):  .  acetaminophen (TYLENOL) 500 MG tablet, Take 1,000 mg by mouth every 6 (six) hours as needed for headache.   Current Outpatient Medications (Other):  Marland Kitchen  buPROPion (WELLBUTRIN XL) 150 MG 24 hr tablet, Take 1 tablet (150 mg total) by mouth daily. Marland Kitchen  CINNAMON PO, Take 1 tablet by mouth daily. Marland Kitchen  DIPHENHYDRAMINE HCL, TOPICAL, (BENADRYL ITCH STOPPING) 2 % GEL, Apply 1 application topically as needed. .  famotidine (PEPCID) 20 MG tablet, Take 1 tablet (20 mg total) by mouth 2 (two) times daily. .  hydrOXYzine (ATARAX/VISTARIL) 25 MG tablet, Take 1 tablet (25 mg total) by mouth every 6 (six) hours. .  mupirocin cream (BACTROBAN) 2 %, Apply 1 application topically 2 (two) times daily. .  ondansetron (ZOFRAN ODT) 4 MG disintegrating tablet, Take 1 tablet (4 mg total) by mouth every 8 (eight) hours as needed for nausea or vomiting. .  TURMERIC PO, Take 1 tablet by mouth daily.    Past medical history, social, surgical and family history all reviewed in electronic medical record.  No pertanent information unless stated regarding to the chief complaint.   Review of Systems:  No headache, visual changes, nausea, vomiting, diarrhea, constipation, dizziness, abdominal pain, skin rash, fevers, chills, night sweats, weight loss, swollen lymph nodes, body aches, joint swelling,  chest pain, shortness of breath, mood changes.  Positive muscle aches  Objective  Blood pressure (!) 146/94, pulse 87, height 5' (1.524 m), weight 276 lb (125.2 kg), SpO2 92 %.    General: No apparent distress alert and oriented x3 mood and affect normal, dressed appropriately.  Morbidly obese HEENT: Pupils equal, extraocular movements intact  Respiratory: Patient's  speak in full sentences and does not appear short of breath  Cardiovascular: Trace lower extremity edema, non tender, no erythema  Skin: Warm dry intact with no signs of infection or rash on extremities or on axial skeleton.  Abdomen: Soft nontender  Neuro: Cranial nerves II through XII are intact, neurovascularly intact in all extremities with 2+ DTRs and 2+ pulses.  Lymph: No lymphadenopathy of posterior or anterior cervical chain or axillae bilaterally.  Gait severely antalgic MSK:  Non tender with limited range of motion and good stability and symmetric strength and tone of shoulders, elbows, wrist, hip, and ankles bilaterally.  Knee: Bilateral valgus deformity noted. Large thigh to calf ratio.  Tender  to palpation over medial and PF joint line.  ROM full in flexion and extension and lower leg rotation. instability with valgus force.  painful patellar compression. Patellar glide with moderate crepitus. Patellar and quadriceps tendons unremarkable. Hamstring and quadriceps strength is normal.  After informed written and verbal consent, patient was seated on exam table. Right knee was prepped with alcohol swab and utilizing anterolateral approach, patient's right knee space was injected with 4:1  marcaine 0.5%: Kenalog 40mg /dL. Patient tolerated the procedure well without immediate complications.  After informed written and verbal consent, patient was seated on exam table. Left knee was prepped with alcohol swab and utilizing anterolateral approach, patient's left knee space was injected with 4:1  marcaine 0.5%: Kenalog 40mg /dL. Patient tolerated the procedure well without immediate complications.   Impression and Recommendations:     This case required medical decision making of moderate complexity. The above documentation has been reviewed and is accurate and complete Lyndal Pulley, DO       Note: This dictation was prepared with Dragon dictation along with smaller phrase  technology. Any transcriptional errors that result from this process are unintentional.

## 2017-12-15 ENCOUNTER — Ambulatory Visit: Payer: Managed Care, Other (non HMO) | Admitting: Family Medicine

## 2017-12-15 ENCOUNTER — Encounter: Payer: Self-pay | Admitting: Family Medicine

## 2017-12-15 DIAGNOSIS — M170 Bilateral primary osteoarthritis of knee: Secondary | ICD-10-CM | POA: Diagnosis not present

## 2017-12-15 NOTE — Progress Notes (Signed)
[facility] [address], [place] [address] Phone: [phone] Subjective:     CC: bilateral knee pain   JOI:[id number]  Lynn Fields is a 47 y.o. female coming in with complaint of neck pain. Pain began at work today on left trap. Pain with rotation left and flexion. Pain radiataes into thoracic spine. Did lift some items overhead today at work.   Patient is also here for bilateral knee injections.  Patient has failed all conservative therapy.  Has done well with Visco supplementation previously.  Last injections days ago.      Past Medical History:  Diagnosis Date  . Arthritis    knees  . Depression   . Diabetes mellitus without complication ([place])   . Hypertension   . Migraines   . Ulcer    Past Surgical History:  Procedure Laterality Date  . CESAREAN SECTION  1993  . HYSTEROSCOPY WITH NOVASURE N/A 06/02/2016   Procedure: HYSTEROSCOPY WITH NOVASURE;  Surgeon: Paula Compton, MD;  Location: [facility];  Service: Gynecology;  Laterality: N/A;  . TUBAL LIGATION  1993   Social History   Socioeconomic History  . Marital status: Married    Spouse name: Not on file  . Number of children: Not on file  . Years of education: 105  . Highest education level: Not on file  Occupational History  . Occupation: Product manager: [employer]  . Financial resource strain: Not on file  . Food insecurity:    Worry: Not on file    Inability: Not on file  . Transportation needs:    Medical: Not on file    Non-medical: Not on file  Tobacco Use  . Smoking status: Never Smoker  . Smokeless tobacco: Never Used  Substance and Sexual Activity  . Alcohol use: Yes    Comment: occ  . Drug use: No  . Sexual activity: Yes    Birth control/protection: Surgical  Lifestyle  . Physical activity:    Days per week: Not on file    Minutes per session: Not on file  . Stress: Not on file  Relationships  . Social connections:      Talks on phone: Not on file    Gets together: Not on file    Attends religious service: Not on file    Active member of club or organization: Not on file    Attends meetings of clubs or organizations: Not on file    Relationship status: Not on file  Other Topics Concern  . Not on file  Social History Narrative   Regular exercise-yes   Caffeine Use-no   Allergies  Allergen Reactions  . Other Anaphylaxis    Hair dye  . Shellfish Allergy Anaphylaxis    Closes throat  . Codeine Itching  . Menthol Hives  . Morphine And Related Itching  . Penicillins Swelling    Has patient had a PCN reaction causing immediate rash, facial/tongue/throat swelling, SOB or lightheadedness with hypotension: Yes Has patient had a PCN reaction causing severe rash involving mucus membranes or skin necrosis: No Has patient had a PCN reaction that required hospitalization No Has patient had a PCN reaction occurring within the last 10 years: No If all of the above answers are "NO", then may proceed with Cephalosporin use.  Ebbie Ridge [Pseudoephedrine Hcl] Swelling  . Azithromycin Rash  . Erythromycin Rash   Family History  Problem Relation Age of Onset  .  Diabetes Mother   . Hypertension Mother   . Heart disease Mother   . Hyperlipidemia Mother   . Diabetes Father   . Hypertension Father   . Heart disease Father   . Hyperlipidemia Father   . Cervical cancer Sister   . Heart disease Sister   . Diabetes Sister   . Heart disease Sister   . Diabetes Sister      Current Outpatient Medications (Cardiovascular):  .  atorvastatin (LIPITOR) 40 MG tablet, TAKE 1 TABLET BY MOUTH ONCE DAILY AT  6  PM (Patient taking differently: Take 40 mg by mouth daily at 6 PM. ) .  EPINEPHrine 0.3 mg/0.3 mL IJ SOAJ injection, Inject 0.3 mg into the muscle once as needed for anaphylaxis. .  hydrochlorothiazide (MICROZIDE) 12.5 MG capsule, Take 1 capsule (12.5 mg total) by mouth daily. Marland Kitchen  losartan (COZAAR) 50 MG tablet,  Take 1 tablet (50 mg total) by mouth daily.  Current Outpatient Medications (Respiratory):  .  loratadine (CLARITIN) 10 MG tablet, Take 1 tablet (10 mg total) by mouth daily.  Current Outpatient Medications (Analgesics):  .  acetaminophen (TYLENOL) 500 MG tablet, Take 1,000 mg by mouth every 6 (six) hours as needed for headache.   Current Outpatient Medications (Other):  Marland Kitchen  buPROPion (WELLBUTRIN XL) 150 MG 24 hr tablet, Take 1 tablet (150 mg total) by mouth daily. Marland Kitchen  CINNAMON PO, Take 1 tablet by mouth daily. Marland Kitchen  DIPHENHYDRAMINE HCL, TOPICAL, (BENADRYL ITCH STOPPING) 2 % GEL, Apply 1 application topically as needed. .  famotidine (PEPCID) 20 MG tablet, Take 1 tablet (20 mg total) by mouth 2 (two) times daily. .  hydrOXYzine (ATARAX/VISTARIL) 25 MG tablet, Take 1 tablet (25 mg total) by mouth every 6 (six) hours. .  mupirocin cream (BACTROBAN) 2 %, Apply 1 application topically 2 (two) times daily. .  ondansetron (ZOFRAN ODT) 4 MG disintegrating tablet, Take 1 tablet (4 mg total) by mouth every 8 (eight) hours as needed for nausea or vomiting. .  TURMERIC PO, Take 1 tablet by mouth daily.    Past medical history, social, surgical and family history all reviewed in electronic medical record.  No pertanent information unless stated regarding to the chief complaint.   Review of Systems:  No headache, visual changes, nausea, vomiting, diarrhea, constipation, dizziness, abdominal pain, skin rash, fevers, chills, night sweats, weight loss, swollen lymph nodes, body aches,, chest pain, shortness of breath, mood changes.  Positive muscle aches and joint swelling  Objective  Blood pressure (!) 128/94, pulse 92, height 5' (1.524 m), weight 270 lb (122.5 kg), SpO2 99 %.    General: No apparent distress alert and oriented x3 mood and affect normal, dressed appropriately.  Morbidly obese HEENT: Pupils equal, extraocular movements intact  Respiratory: Patient's speak in full sentences and does not  appear short of breath  Cardiovascular: No lower extremity edema, non tender, no erythema  Skin: Warm dry intact with no signs of infection or rash on extremities or on axial skeleton.  Abdomen: Soft nontender morbidly obese Neuro: Cranial nerves II through XII are intact, neurovascularly intact in all extremities with 2+ DTRs and 2+ pulses.  Lymph: No lymphadenopathy of posterior or anterior cervical chain or axillae bilaterally.  Gait antalgic gait MSK:  Non tender with full range of motion and good stability and symmetric strength and tone of shoulders, elbows, wrist, hip and ankles bilaterally.  Knee:bilateral  valgus deformity noted. Abnormal thigh to calf ratio.  Tender to palpation over medial and  PF joint line.  ROM full in flexion and extension and lower leg rotation. instability with valgus force.  painful patellar compression. Patellar glide with moderate crepitus. Patellar and quadriceps tendons unremarkable. Hamstring and quadriceps strength is normal.  After informed written and verbal consent, patient was seated on exam table. Right knee was prepped with alcohol swab and utilizing anterolateral approach, patient's right knee space was injected with15 mg/2.5 mL of Orthovisc(sodium hyaluronate) in a prefilled syringe was injected easily into the knee through a 22-gauge needle..Patient tolerated the procedure well without immediate complications.  After informed written and verbal consent, patient was seated on exam table. Left knee was prepped with alcohol swab and utilizing anterolateral approach, patient's left knee space was injected with15 mg/2.5 mL of Orthovisc(sodium hyaluronate) in a prefilled syringe was injected easily into the knee through a 22-gauge needle..Patient tolerated the procedure well without immediate complications.    Impression and Recommendations:     This case required medical decision making of moderate complexity. The above documentation has been  reviewed and is accurate and complete Lyndal Pulley, DO       Note: This dictation was prepared with Dragon dictation along with smaller phrase technology. Any transcriptional errors that result from this process are unintentional.

## 2017-12-15 NOTE — Assessment & Plan Note (Signed)
Patient was started on Visco supplementation.  Responded fairly well to her previous one 6 months ago.  Patient's hopefully will continue to have improvement again.  Discussed icing regimen and home exercise.  Discussed which activities to avoid.  Follow-up in 1 week for second in a series of 4 injections.

## 2017-12-22 ENCOUNTER — Ambulatory Visit: Payer: Managed Care, Other (non HMO) | Admitting: Family Medicine

## 2017-12-22 ENCOUNTER — Encounter: Payer: Self-pay | Admitting: Family Medicine

## 2017-12-22 DIAGNOSIS — M170 Bilateral primary osteoarthritis of knee: Secondary | ICD-10-CM | POA: Diagnosis not present

## 2017-12-22 NOTE — Assessment & Plan Note (Signed)
Second in a series of 4 injections given today.  Tolerated the procedure well.  Discussed icing regimen and home exercises.  Discussed which activities to do which wants to avoid.  Follow-up again in 1 weeks

## 2017-12-22 NOTE — Patient Instructions (Signed)
Good to see you  You know the drill  One more to go  Enjoy the [place]!

## 2017-12-22 NOTE — Progress Notes (Signed)
Established Patient Office Visit  Subjective:  Patient ID: Lynn Fields, female    DOB: 1970-11-21  Age: 47 y.o. MRN: [id number]  I Kana Grandville Silos am serving as a scribe for Dr. Hulan Saas.   CC: knee pain  HPI [name] presents for knee pain severe OA.  Here for 2nd orthovisc injection.  Patient states her knees were doing well until she went to work today.   Past Medical History:  Diagnosis Date  . Arthritis    knees  . Depression   . Diabetes mellitus without complication ([place])   . Hypertension   . Migraines   . Ulcer     Past Surgical History:  Procedure Laterality Date  . CESAREAN SECTION  1993  . HYSTEROSCOPY WITH NOVASURE N/A 06/02/2016   Procedure: HYSTEROSCOPY WITH NOVASURE;  Surgeon: Paula Compton, MD;  Location: [facility];  Service: Gynecology;  Laterality: N/A;  . TUBAL LIGATION  1993    Family History  Problem Relation Age of Onset  . Diabetes Mother   . Hypertension Mother   . Heart disease Mother   . Hyperlipidemia Mother   . Diabetes Father   . Hypertension Father   . Heart disease Father   . Hyperlipidemia Father   . Cervical cancer Sister   . Heart disease Sister   . Diabetes Sister   . Heart disease Sister   . Diabetes Sister     Social History   Socioeconomic History  . Marital status: Married    Spouse name: Not on file  . Number of children: Not on file  . Years of education: 8  . Highest education level: Not on file  Occupational History  . Occupation: Product manager: [employer]  . Financial resource strain: Not on file  . Food insecurity:    Worry: Not on file    Inability: Not on file  . Transportation needs:    Medical: Not on file    Non-medical: Not on file  Tobacco Use  . Smoking status: Never Smoker  . Smokeless tobacco: Never Used  Substance and Sexual Activity  . Alcohol use: Yes    Comment: occ  . Drug use: No  . Sexual activity: Yes    Birth control/protection:  Surgical  Lifestyle  . Physical activity:    Days per week: Not on file    Minutes per session: Not on file  . Stress: Not on file  Relationships  . Social connections:    Talks on phone: Not on file    Gets together: Not on file    Attends religious service: Not on file    Active member of club or organization: Not on file    Attends meetings of clubs or organizations: Not on file    Relationship status: Not on file  . Intimate partner violence:    Fear of current or ex partner: Not on file    Emotionally abused: Not on file    Physically abused: Not on file    Forced sexual activity: Not on file  Other Topics Concern  . Not on file  Social History Narrative   Regular exercise-yes   Caffeine Use-no    Outpatient Medications Prior to Visit  Medication Sig Dispense Refill  . acetaminophen (TYLENOL) 500 MG tablet Take 1,000 mg by mouth every 6 (six) hours as needed for headache.    Marland Kitchen atorvastatin (LIPITOR) 40 MG tablet TAKE 1 TABLET BY MOUTH ONCE  DAILY AT  6  PM (Patient taking differently: Take 40 mg by mouth daily at 6 PM. ) 90 tablet 3  . buPROPion (WELLBUTRIN XL) 150 MG 24 hr tablet Take 1 tablet (150 mg total) by mouth daily. 90 tablet 3  . CINNAMON PO Take 1 tablet by mouth daily.    Marland Kitchen DIPHENHYDRAMINE HCL, TOPICAL, (BENADRYL ITCH STOPPING) 2 % GEL Apply 1 application topically as needed.    Marland Kitchen EPINEPHrine 0.3 mg/0.3 mL IJ SOAJ injection Inject 0.3 mg into the muscle once as needed for anaphylaxis.    . famotidine (PEPCID) 20 MG tablet Take 1 tablet (20 mg total) by mouth 2 (two) times daily. 30 tablet 0  . hydrochlorothiazide (MICROZIDE) 12.5 MG capsule Take 1 capsule (12.5 mg total) by mouth daily. 90 capsule 3  . hydrOXYzine (ATARAX/VISTARIL) 25 MG tablet Take 1 tablet (25 mg total) by mouth every 6 (six) hours. 20 tablet 0  . loratadine (CLARITIN) 10 MG tablet Take 1 tablet (10 mg total) by mouth daily. 14 tablet 0  . losartan (COZAAR) 50 MG tablet Take 1 tablet (50 mg  total) by mouth daily. 90 tablet 3  . mupirocin cream (BACTROBAN) 2 % Apply 1 application topically 2 (two) times daily. 15 g 0  . ondansetron (ZOFRAN ODT) 4 MG disintegrating tablet Take 1 tablet (4 mg total) by mouth every 8 (eight) hours as needed for nausea or vomiting. 35 tablet 3  . TURMERIC PO Take 1 tablet by mouth daily.     No facility-administered medications prior to visit.     Allergies  Allergen Reactions  . Other Anaphylaxis    Hair dye  . Shellfish Allergy Anaphylaxis    Closes throat  . Codeine Itching  . Menthol Hives  . Morphine And Related Itching  . Penicillins Swelling    Has patient had a PCN reaction causing immediate rash, facial/tongue/throat swelling, SOB or lightheadedness with hypotension: Yes Has patient had a PCN reaction causing severe rash involving mucus membranes or skin necrosis: No Has patient had a PCN reaction that required hospitalization No Has patient had a PCN reaction occurring within the last 10 years: No If all of the above answers are "NO", then may proceed with Cephalosporin use.  Ebbie Ridge [Pseudoephedrine Hcl] Swelling  . Azithromycin Rash  . Erythromycin Rash       Objective:    Physical Exam  Blood pressure 130/90, pulse 86, height 5' (1.524 m), weight 267 lb (121.1 kg), SpO2 96 %.  Knee: Bilateral valgus deformity noted. Large thigh to calf ratio.  Tender to palpation over medial and PF joint line.  ROM full in flexion and extension and lower leg rotation. instability with valgus force.  painful patellar compression. Patellar glide with moderate crepitus. Patellar and quadriceps tendons unremarkable. Hamstring and quadriceps strength is normal.   BP 130/90 (BP Location: Left Arm, Patient Position: Sitting)   Pulse 86   Ht 5' (1.524 m)   Wt 267 lb (121.1 kg)   SpO2 96%   BMI 52.14 kg/m  Wt Readings from Last 3 Encounters:  12/22/17 267 lb (121.1 kg)  12/15/17 270 lb (122.5 kg)  12/10/17 276 lb (125.2 kg)     After informed written and verbal consent, patient was seated on exam table. Right knee was prepped with alcohol swab and utilizing anterolateral approach, patient's right knee space was injected with15 mg/2.5 mL of Orthovisc(sodium hyaluronate) in a prefilled syringe was injected easily into the knee through a 22-gauge needle..Patient  tolerated the procedure well without immediate complications.  After informed written and verbal consent, patient was seated on exam table. Left knee was prepped with alcohol swab and utilizing anterolateral approach, patient's left knee space was injected with15 mg/2.5 mL of Orthovisc(sodium hyaluronate) in a prefilled syringe was injected easily into the knee through a 22-gauge needle..Patient tolerated the procedure well without immediate complications.     Lyndal Pulley, DO

## 2017-12-27 ENCOUNTER — Other Ambulatory Visit: Payer: Self-pay | Admitting: Nurse Practitioner

## 2017-12-27 DIAGNOSIS — IMO0002 Reserved for concepts with insufficient information to code with codable children: Secondary | ICD-10-CM

## 2017-12-27 DIAGNOSIS — E1165 Type 2 diabetes mellitus with hyperglycemia: Secondary | ICD-10-CM

## 2017-12-27 DIAGNOSIS — E118 Type 2 diabetes mellitus with unspecified complications: Principal | ICD-10-CM

## 2017-12-29 ENCOUNTER — Ambulatory Visit: Payer: Managed Care, Other (non HMO) | Admitting: Family Medicine

## 2017-12-29 ENCOUNTER — Encounter: Payer: Self-pay | Admitting: Family Medicine

## 2017-12-29 DIAGNOSIS — M170 Bilateral primary osteoarthritis of knee: Secondary | ICD-10-CM | POA: Diagnosis not present

## 2017-12-29 NOTE — Patient Instructions (Signed)
Good to see you  Ice is your friend  Ice 20 minutes 2 times daily. Usually after activity and before bed. You know the drill  See em again in 1 week for the finale.

## 2017-12-29 NOTE — Assessment & Plan Note (Signed)
Third injection given today.  Discussed icing regimen and home exercises.  Discussed which activities of doing which wants to avoid.  Increase activity slowly over the course next week.  Follow-up in 1 week for fourth and final injections

## 2017-12-29 NOTE — Progress Notes (Signed)
[name] [address] [place], [place] [address] Phone: [phone] Subjective:    I Lynn Fields am serving as a Education administrator for Dr. Hulan Saas.     CC: Bilateral knee pain  IRC:[id number]  Lynn Fields is a 47 y.o. female coming in with complaint of bilateral knee pain. States the knees are making progress. Still in some pain.       Past Medical History:  Diagnosis Date  . Arthritis    knees  . Depression   . Diabetes mellitus without complication ([place])   . Hypertension   . Migraines   . Ulcer    Past Surgical History:  Procedure Laterality Date  . CESAREAN SECTION  1993  . HYSTEROSCOPY WITH NOVASURE N/A 06/02/2016   Procedure: HYSTEROSCOPY WITH NOVASURE;  Surgeon: Paula Compton, MD;  Location: [facility];  Service: Gynecology;  Laterality: N/A;  . TUBAL LIGATION  1993   Social History   Socioeconomic History  . Marital status: Married    Spouse name: Not on file  . Number of children: Not on file  . Years of education: 47  . Highest education level: Not on file  Occupational History  . Occupation: Product manager: [employer]  . Financial resource strain: Not on file  . Food insecurity:    Worry: Not on file    Inability: Not on file  . Transportation needs:    Medical: Not on file    Non-medical: Not on file  Tobacco Use  . Smoking status: Never Smoker  . Smokeless tobacco: Never Used  Substance and Sexual Activity  . Alcohol use: Yes    Comment: occ  . Drug use: No  . Sexual activity: Yes    Birth control/protection: Surgical  Lifestyle  . Physical activity:    Days per week: Not on file    Minutes per session: Not on file  . Stress: Not on file  Relationships  . Social connections:    Talks on phone: Not on file    Gets together: Not on file    Attends religious service: Not on file    Active member of club or organization: Not on file    Attends meetings of clubs or  organizations: Not on file    Relationship status: Not on file  Other Topics Concern  . Not on file  Social History Narrative   Regular exercise-yes   Caffeine Use-no   Allergies  Allergen Reactions  . Other Anaphylaxis    Hair dye  . Shellfish Allergy Anaphylaxis    Closes throat  . Codeine Itching  . Menthol Hives  . Morphine And Related Itching  . Penicillins Swelling    Has patient had a PCN reaction causing immediate rash, facial/tongue/throat swelling, SOB or lightheadedness with hypotension: Yes Has patient had a PCN reaction causing severe rash involving mucus membranes or skin necrosis: No Has patient had a PCN reaction that required hospitalization No Has patient had a PCN reaction occurring within the last 10 years: No If all of the above answers are "NO", then may proceed with Cephalosporin use.  Ebbie Ridge [Pseudoephedrine Hcl] Swelling  . Azithromycin Rash  . Erythromycin Rash   Family History  Problem Relation Age of Onset  . Diabetes Mother   . Hypertension Mother   . Heart disease Mother   . Hyperlipidemia Mother   . Diabetes Father   . Hypertension Father   .  Heart disease Father   . Hyperlipidemia Father   . Cervical cancer Sister   . Heart disease Sister   . Diabetes Sister   . Heart disease Sister   . Diabetes Sister      Current Outpatient Medications (Cardiovascular):  .  atorvastatin (LIPITOR) 40 MG tablet, TAKE 1 TABLET BY MOUTH ONCE DAILY AT  6  PM (Patient taking differently: Take 40 mg by mouth daily at 6 PM. ) .  EPINEPHrine 0.3 mg/0.3 mL IJ SOAJ injection, Inject 0.3 mg into the muscle once as needed for anaphylaxis. .  hydrochlorothiazide (MICROZIDE) 12.5 MG capsule, Take 1 capsule (12.5 mg total) by mouth daily. Marland Kitchen  losartan (COZAAR) 50 MG tablet, Take 1 tablet (50 mg total) by mouth daily.  Current Outpatient Medications (Respiratory):  .  loratadine (CLARITIN) 10 MG tablet, Take 1 tablet (10 mg total) by mouth daily.  Current  Outpatient Medications (Analgesics):  .  acetaminophen (TYLENOL) 500 MG tablet, Take 1,000 mg by mouth every 6 (six) hours as needed for headache.   Current Outpatient Medications (Other):  Marland Kitchen  buPROPion (WELLBUTRIN XL) 150 MG 24 hr tablet, Take 1 tablet (150 mg total) by mouth daily. Marland Kitchen  CINNAMON PO, Take 1 tablet by mouth daily. Marland Kitchen  DIPHENHYDRAMINE HCL, TOPICAL, (BENADRYL ITCH STOPPING) 2 % GEL, Apply 1 application topically as needed. .  famotidine (PEPCID) 20 MG tablet, Take 1 tablet (20 mg total) by mouth 2 (two) times daily. .  hydrOXYzine (ATARAX/VISTARIL) 25 MG tablet, Take 1 tablet (25 mg total) by mouth every 6 (six) hours. .  mupirocin cream (BACTROBAN) 2 %, Apply 1 application topically 2 (two) times daily. .  ondansetron (ZOFRAN ODT) 4 MG disintegrating tablet, Take 1 tablet (4 mg total) by mouth every 8 (eight) hours as needed for nausea or vomiting. .  TURMERIC PO, Take 1 tablet by mouth daily.    Past medical history, social, surgical and family history all reviewed in electronic medical record.  No pertanent information unless stated regarding to the chief complaint.   Review of Systems:  No headache, visual changes, nausea, vomiting, diarrhea, constipation, dizziness, abdominal pain, skin rash, fevers, chills, night sweats, weight loss, swollen lymph nodes, body aches, joint swelling, muscle aches, chest pain, shortness of breath, mood changes.   Objective  There were no vitals taken for this visit.    General: No apparent distress alert and oriented x3 mood and affect normal, dressed appropriately.  Knee: Bilateral valgus deformity noted. Large thigh to calf ratio.  Tender to palpation over medial and PF joint line.  ROM full in flexion and extension and lower leg rotation. instability with valgus force.  painful patellar compression. Patellar glide with moderate crepitus. Patellar and quadriceps tendons unremarkable. Hamstring and quadriceps strength is  normal.   \After informed written and verbal consent, patient was seated on exam table. Right knee was prepped with alcohol swab and utilizing anterolateral approach, patient's right knee space was injected with15 mg/2.5 mL of Orthovisc(sodium hyaluronate) in a prefilled syringe was injected easily into the knee through a 22-gauge needle..Patient tolerated the procedure well without immediate complications.  After informed written and verbal consent, patient was seated on exam table. Left knee was prepped with alcohol swab and utilizing anterolateral approach, patient's left knee space was injected with15 mg/2.5 mL of Orthovisc(sodium hyaluronate) in a prefilled syringe was injected easily into the knee through a 22-gauge needle..Patient tolerated the procedure well without immediate complications.   Impression and Recommendations:  This case required medical decision making of moderate complexity. The above documentation has been reviewed and is accurate and complete Lynn Fields       Note: This dictation was prepared with Dragon dictation along with smaller phrase technology. Any transcriptional errors that result from this process are unintentional.

## 2018-01-04 NOTE — Progress Notes (Signed)
[facility] [address] [place], [place] [address] Phone: [phone] Subjective:   [name], am serving as a scribe for Dr. Hulan Saas.   CC: Bilateral knee injections  OIZ:[id number]  Lynn Fields is a 47 y.o. female coming in with complaint of bilateral knee pain. She does feel like hse improveing with the orthovisc injections. Is here for 4 out of 4 today.        Past Medical History:  Diagnosis Date  . Arthritis    knees  . Depression   . Diabetes mellitus without complication ([facility])   . Hypertension   . Migraines   . Ulcer    Past Surgical History:  Procedure Laterality Date  . CESAREAN SECTION  1993  . HYSTEROSCOPY WITH NOVASURE N/A 06/02/2016   Procedure: HYSTEROSCOPY WITH NOVASURE;  Surgeon: Paula Compton, MD;  Location: [facility];  Service: Gynecology;  Laterality: N/A;  . TUBAL LIGATION  1993   Social History   Socioeconomic History  . Marital status: Married    Spouse name: Not on file  . Number of children: Not on file  . Years of education: 62  . Highest education level: Not on file  Occupational History  . Occupation: Product manager: [employer]  . Financial resource strain: Not on file  . Food insecurity:    Worry: Not on file    Inability: Not on file  . Transportation needs:    Medical: Not on file    Non-medical: Not on file  Tobacco Use  . Smoking status: Never Smoker  . Smokeless tobacco: Never Used  Substance and Sexual Activity  . Alcohol use: Yes    Comment: occ  . Drug use: No  . Sexual activity: Yes    Birth control/protection: Surgical  Lifestyle  . Physical activity:    Days per week: Not on file    Minutes per session: Not on file  . Stress: Not on file  Relationships  . Social connections:    Talks on phone: Not on file    Gets together: Not on file    Attends religious service: Not on file    Active member of club or organization: Not on file   Attends meetings of clubs or organizations: Not on file    Relationship status: Not on file  Other Topics Concern  . Not on file  Social History Narrative   Regular exercise-yes   Caffeine Use-no   Allergies  Allergen Reactions  . Other Anaphylaxis    Hair dye  . Shellfish Allergy Anaphylaxis    Closes throat  . Codeine Itching  . Menthol Hives  . Morphine And Related Itching  . Penicillins Swelling    Has patient had a PCN reaction causing immediate rash, facial/tongue/throat swelling, SOB or lightheadedness with hypotension: Yes Has patient had a PCN reaction causing severe rash involving mucus membranes or skin necrosis: No Has patient had a PCN reaction that required hospitalization No Has patient had a PCN reaction occurring within the last 10 years: No If all of the above answers are "NO", then may proceed with Cephalosporin use.  [facility] [Pseudoephedrine Hcl] Swelling  . Azithromycin Rash  . Erythromycin Rash   Family History  Problem Relation Age of Onset  . Diabetes Mother   . Hypertension Mother   . Heart disease Mother   . Hyperlipidemia Mother   . Diabetes Father   . Hypertension  Father   . Heart disease Father   . Hyperlipidemia Father   . Cervical cancer Sister   . Heart disease Sister   . Diabetes Sister   . Heart disease Sister   . Diabetes Sister      Current Outpatient Medications (Cardiovascular):  .  atorvastatin (LIPITOR) 40 MG tablet, TAKE 1 TABLET BY MOUTH ONCE DAILY AT  6  PM (Patient taking differently: Take 40 mg by mouth daily at 6 PM. ) .  EPINEPHrine 0.3 mg/0.3 mL IJ SOAJ injection, Inject 0.3 mg into the muscle once as needed for anaphylaxis. .  hydrochlorothiazide (MICROZIDE) 12.5 MG capsule, Take 1 capsule (12.5 mg total) by mouth daily. [facility]  losartan (COZAAR) 50 MG tablet, Take 1 tablet (50 mg total) by mouth daily.  Current Outpatient Medications (Respiratory):  .  loratadine (CLARITIN) 10 MG tablet, Take 1 tablet (10 mg total) by  mouth daily.  Current Outpatient Medications (Analgesics):  .  acetaminophen (TYLENOL) 500 MG tablet, Take 1,000 mg by mouth every 6 (six) hours as needed for headache.   Current Outpatient Medications (Other):  Lynn Fields  buPROPion (WELLBUTRIN XL) 150 MG 24 hr tablet, Take 1 tablet (150 mg total) by mouth daily. [facility]  CINNAMON PO, Take 1 tablet by mouth daily. [facility]  DIPHENHYDRAMINE HCL, TOPICAL, (BENADRYL ITCH STOPPING) 2 % GEL, Apply 1 application topically as needed. .  famotidine (PEPCID) 20 MG tablet, Take 1 tablet (20 mg total) by mouth 2 (two) times daily. .  hydrOXYzine (ATARAX/VISTARIL) 25 MG tablet, Take 1 tablet (25 mg total) by mouth every 6 (six) hours. .  mupirocin cream (BACTROBAN) 2 %, Apply 1 application topically 2 (two) times daily. .  ondansetron (ZOFRAN ODT) 4 MG disintegrating tablet, Take 1 tablet (4 mg total) by mouth every 8 (eight) hours as needed for nausea or vomiting. .  TURMERIC PO, Take 1 tablet by mouth daily.    Past medical history, social, surgical and family history all reviewed in electronic medical record.  No pertanent information unless stated regarding to the chief complaint.   Review of Systems:  No headache, visual changes, nausea, vomiting, diarrhea, constipation, dizziness, abdominal pain, skin rash, fevers, chills, night sweats, weight loss, swollen lymph nodes, body aches, chest pain, shortness of breath, mood changes.  Positive muscle aches and joint swelling  Objective  There were no vitals taken for this visit.    General: No apparent distress alert and oriented x3 mood and affect normal, dressed appropriately.  HEENT: Pupils equal, extraocular movements intact  Respiratory: Patient's speak in full sentences and does not appear short of breath  Cardiovascular: No lower extremity edema, non tender, no erythema  Skin: Warm dry intact with no signs of infection or rash on extremities or on axial skeleton.  Abdomen: Soft nontender  Neuro: Cranial  nerves II through XII are intact, neurovascularly intact in all extremities with 2+ DTRs and 2+ pulses.  Lymph: No lymphadenopathy of posterior or anterior cervical chain or axillae bilaterally.  Gait antalgic MSK: Knee: Bilateral valgus deformity noted. Large thigh to calf ratio.  Tender to palpation over medial and PF joint line.  ROM full in flexion and extension and lower leg rotation. instability with valgus force.  painful patellar compression. Patellar glide with moderate crepitus. Patellar and quadriceps tendons unremarkable. Hamstring and quadriceps strength is normal. Contralateral knee shows  After informed written and verbal consent, patient was seated on exam table. Right knee was prepped with alcohol swab and utilizing anterolateral approach, patient's  right knee space was injected with15 mg/2.5 mL of Orthovisc(sodium hyaluronate) in a prefilled syringe was injected easily into the knee through a 22-gauge needle..Patient tolerated the procedure well without immediate complications.  After informed written and verbal consent, patient was seated on exam table. Left knee was prepped with alcohol swab and utilizing anterolateral approach, patient's left knee space was injected with15 mg/2.5 mL of Orthovisc(sodium hyaluronate) in a prefilled syringe was injected easily into the knee through a 22-gauge needle..Patient tolerated the procedure well without immediate complications.   Impression and Recommendations:     This case required medical decision making of moderate complexity. The above documentation has been reviewed and is accurate and complete Lyndal Pulley, DO       Note: This dictation was prepared with Dragon dictation along with smaller phrase technology. Any transcriptional errors that result from this process are unintentional.

## 2018-01-04 NOTE — Assessment & Plan Note (Signed)
Fourth and final Visco supplementation given.  Responded well.  Will take 1 month off completely healed.  Follow-up again in 4 weeks

## 2018-01-05 ENCOUNTER — Encounter: Payer: Self-pay | Admitting: Family Medicine

## 2018-01-05 ENCOUNTER — Ambulatory Visit: Payer: Managed Care, Other (non HMO) | Admitting: Family Medicine

## 2018-01-05 DIAGNOSIS — M170 Bilateral primary osteoarthritis of knee: Secondary | ICD-10-CM | POA: Diagnosis not present

## 2018-01-05 NOTE — Patient Instructions (Signed)
Finish! You are good to go  Enjoy your holiday  Stay active See me when you need me

## 2018-01-11 ENCOUNTER — Encounter: Payer: Self-pay | Admitting: Internal Medicine

## 2018-01-12 MED ORDER — GLIPIZIDE 5 MG PO TABS: 5.0000 mg | ORAL_TABLET | Freq: Every day | ORAL | 0 refills | Status: DC

## 2018-01-12 NOTE — Addendum Note (Signed)
Addended by: Pricilla Holm A on: 01/12/2018 09:51 AM   Modules accepted: Orders

## 2018-02-10 ENCOUNTER — Encounter: Payer: Self-pay | Admitting: Family

## 2018-02-10 ENCOUNTER — Ambulatory Visit (INDEPENDENT_AMBULATORY_CARE_PROVIDER_SITE_OTHER)
Admission: RE | Admit: 2018-02-10 | Discharge: 2018-02-10 | Disposition: A | Discharge status: Home / Self Care | Payer: Managed Care, Other (non HMO) | Source: Ambulatory Visit | Attending: Family | Admitting: Family

## 2018-02-10 ENCOUNTER — Ambulatory Visit: Payer: Managed Care, Other (non HMO) | Admitting: Family

## 2018-02-10 VITALS — BP 130/88 | HR 87 | Temp 98.1°F | Ht 60.0 in | Wt 279.0 lb

## 2018-02-10 DIAGNOSIS — M545 Low back pain, unspecified: Secondary | ICD-10-CM

## 2018-02-10 MED ORDER — MELOXICAM 15 MG PO TABS: 15.0000 mg | ORAL_TABLET | Freq: Every day | ORAL | 0 refills | Status: DC

## 2018-02-10 MED ORDER — METHOCARBAMOL 500 MG PO TABS: 500.0000 mg | ORAL_TABLET | Freq: Three times a day (TID) | ORAL | 0 refills | Status: DC | PRN

## 2018-02-10 NOTE — Progress Notes (Signed)
[facility] is a 48 y.o. female with the following history as recorded in EpicCare:  Patient Active Problem List   Diagnosis Date Noted  . Hyperlipidemia associated with type 2 diabetes mellitus ([place]) 08/10/2017  . Allergic reaction to food 02/04/2017  . Adjustment disorder with mixed anxiety and depressed mood 08/07/2016  . Degenerative arthritis of knee, bilateral 02/11/2016  . Routine general medical examination at a health care facility 08/16/2015  . Unspecified sleep apnea 06/02/2013  . Essential hypertension 06/14/2012  . Controlled type 2 diabetes mellitus with hypoglycemia ([place]) 06/14/2012  . Obesity, morbid ([place]) 06/14/2012    Current Outpatient Medications  Medication Sig Dispense Refill  . acetaminophen (TYLENOL) 500 MG tablet Take 1,000 mg by mouth every 6 (six) hours as needed for headache.    Marland Kitchen atorvastatin (LIPITOR) 40 MG tablet TAKE 1 TABLET BY MOUTH ONCE DAILY AT  6  PM (Patient taking differently: Take 40 mg by mouth daily at 6 PM. ) 90 tablet 3  . buPROPion (WELLBUTRIN XL) 150 MG 24 hr tablet Take 1 tablet (150 mg total) by mouth daily. 90 tablet 3  . CINNAMON PO Take 1 tablet by mouth daily.    Marland Kitchen DIPHENHYDRAMINE HCL, TOPICAL, (BENADRYL ITCH STOPPING) 2 % GEL Apply 1 application topically as needed.    Marland Kitchen EPINEPHrine 0.3 mg/0.3 mL IJ SOAJ injection Inject 0.3 mg into the muscle once as needed for anaphylaxis.    Marland Kitchen glipiZIDE (GLUCOTROL) 5 MG tablet Take 1 tablet (5 mg total) by mouth daily before breakfast. 30 tablet 0  . hydrochlorothiazide (MICROZIDE) 12.5 MG capsule Take 1 capsule (12.5 mg total) by mouth daily. 90 capsule 3  . hydrOXYzine (ATARAX/VISTARIL) 25 MG tablet Take 1 tablet (25 mg total) by mouth every 6 (six) hours. 20 tablet 0  . loratadine (CLARITIN) 10 MG tablet Take 1 tablet (10 mg total) by mouth daily. 14 tablet 0  . losartan (COZAAR) 50 MG tablet Take 1 tablet (50 mg total) by mouth daily. 90 tablet 3  . montelukast (SINGULAIR) 10 MG tablet     .  ondansetron (ZOFRAN ODT) 4 MG disintegrating tablet Take 1 tablet (4 mg total) by mouth every 8 (eight) hours as needed for nausea or vomiting. 35 tablet 3  . TURMERIC PO Take 1 tablet by mouth daily.    . meloxicam (MOBIC) 15 MG tablet Take 1 tablet (15 mg total) by mouth daily. 30 tablet 0  . methocarbamol (ROBAXIN) 500 MG tablet Take 1 tablet (500 mg total) by mouth every 8 (eight) hours as needed. 30 tablet 0   No current facility-administered medications for this visit.     Allergies: Other; Shellfish allergy; Codeine; Menthol; Morphine and related; Penicillins; Sudafed [pseudoephedrine hcl]; Azithromycin; and Erythromycin  Past Medical History:  Diagnosis Date  . Arthritis    knees  . Depression   . Diabetes mellitus without complication ([place])   . Hypertension   . Migraines   . Ulcer     Past Surgical History:  Procedure Laterality Date  . CESAREAN SECTION  1993  . HYSTEROSCOPY WITH NOVASURE N/A 06/02/2016   Procedure: HYSTEROSCOPY WITH NOVASURE;  Surgeon: Paula Compton, MD;  Location: [facility];  Service: Gynecology;  Laterality: N/A;  . TUBAL LIGATION  1993    Family History  Problem Relation Age of Onset  . Diabetes Mother   . Hypertension Mother   . Heart disease Mother   . Hyperlipidemia Mother   . Diabetes Father   . Hypertension Father   .  Heart disease Father   . Hyperlipidemia Father   . Cervical cancer Sister   . Heart disease Sister   . Diabetes Sister   . Heart disease Sister   . Diabetes Sister     Social History   Tobacco Use  . Smoking status: Never Smoker  . Smokeless tobacco: Never Used  Substance Use Topics  . Alcohol use: Yes    Comment: occ    Subjective:  Patient slipped in the shower last night- did not hit her head/ hit the side of the tub and rolled out onto the floor; now having low back pain- right sided with some radiating symptoms; has tried taking Tylenol and Aleve with no relief; no changes in bowel habits/ loss of bladder control.  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      Objective:  Vitals:   02/10/18 0843  BP: 130/88  Pulse: 87  Temp: 98.1 F (36.7 C)  TempSrc: Oral  SpO2: 98%  Weight: 279 lb 0.6 oz (126.6 kg)  Height: 5' (1.524 m)    General: Well developed, well nourished, in no acute distress  Skin : Warm and dry.  Head: Normocephalic and atraumatic  Eyes: Sclera and conjunctiva clear; pupils round and reactive to light; extraocular movements intact  Lungs: Respirations unlabored; clear to auscultation bilaterally without wheeze, rales, rhonchi  CVS exam: normal rate and regular rhythm.  Musculoskeletal: No deformities; no active joint inflammation  Extremities: No edema, cyanosis, clubbing  Vessels: Symmetric bilaterally  Neurologic: Alert and oriented; speech intact; face symmetrical; moves all extremities well; CNII-XII intact without focal deficit   Assessment:  1. Acute bilateral low back pain without sciatica     Plan:  Low suspicion for fracture- suspect soft-tissue injuries due to fall; will update X-ray at patient request; patient cannot tolerate narcotics; Rx for Mobic, Robaxin- take as directed; work note given as requested.   No follow-ups on file.  Orders Placed This Encounter  Procedures  . DG Lumbar Spine Complete    Standing Status:   Future    Number of Occurrences:   1    Standing Expiration Date:   04/11/2019    Order Specific Question:   Reason for Exam (SYMPTOM  OR DIAGNOSIS REQUIRED)    Answer:   low back pain    Order Specific Question:   Is patient pregnant?    Answer:   No    Order Specific Question:   Preferred imaging location?    Answer:   [facility]    Order Specific Question:   Radiology Contrast Protocol - do NOT remove file path    Answer:   [email or web address]    Requested Prescriptions   Signed Prescriptions Disp Refills  . meloxicam (MOBIC) 15 MG tablet 30 tablet 0     Sig: Take 1 tablet (15 mg total) by mouth daily.  . methocarbamol (ROBAXIN) 500 MG tablet 30 tablet 0    Sig: Take 1 tablet (500 mg total) by mouth every 8 (eight) hours as needed.

## 2018-02-17 LAB — HM DIABETES EYE EXAM

## 2018-02-19 ENCOUNTER — Encounter: Payer: Self-pay | Admitting: Nurse Practitioner

## 2018-02-19 ENCOUNTER — Ambulatory Visit: Payer: Managed Care, Other (non HMO) | Admitting: Nurse Practitioner

## 2018-02-19 VITALS — BP 120/82 | HR 104 | Temp 99.0°F | Ht 60.0 in | Wt 279.0 lb

## 2018-02-19 DIAGNOSIS — J029 Acute pharyngitis, unspecified: Secondary | ICD-10-CM | POA: Diagnosis not present

## 2018-02-19 DIAGNOSIS — R6889 Other general symptoms and signs: Secondary | ICD-10-CM | POA: Diagnosis not present

## 2018-02-19 DIAGNOSIS — J069 Acute upper respiratory infection, unspecified: Secondary | ICD-10-CM | POA: Diagnosis not present

## 2018-02-19 LAB — POC INFLUENZA A&B (BINAX/QUICKVUE)
INFLUENZA B, POC: NEGATIVE
Influenza A, POC: NEGATIVE

## 2018-02-19 LAB — POCT RAPID STREP A (OFFICE): Rapid Strep A Screen: NEGATIVE

## 2018-02-19 NOTE — Patient Instructions (Signed)
For nasal and head congestion may take Sudafed PE 10 mg every 4 hour as needed. Saline nasal spray used frequently. For drainage may use Allegra, Claritin or Zyrtec. If you need stronger medicine to stop drainage may take Chlor-Trimeton 2-4 mg every 4 hours. This may cause drowsiness. Ibuprofen 600 mg every 6 hours as needed for pain, discomfort or fever. Drink plenty of fluids and stay well-hydrated.   Please follow up for fevers over 101, if your symptoms get worse, or if your symptoms dont get better in 1 week.   Viral Respiratory Infection A viral respiratory infection is an illness that affects parts of the body that are used for breathing. These include the lungs, nose, and throat. It is caused by a germ called a virus. Some examples of this kind of infection are:  A cold.  The flu (influenza).  A respiratory syncytial virus (RSV) infection. A person who gets this illness may have the following symptoms:  A stuffy or runny nose.  Yellow or green fluid in the nose.  A cough.  Sneezing.  Tiredness (fatigue).  Achy muscles.  A sore throat.  Sweating or chills.  A fever.  A headache. Follow these instructions at home: Managing pain and congestion  Take over-the-counter and prescription medicines only as told by your doctor.  If you have a sore throat, gargle with salt water. Do this 3-4 times per day or as needed. To make a salt-water mixture, dissolve -1 tsp of salt in 1 cup of warm water. Make sure that all the salt dissolves.  Use nose drops made from salt water. This helps with stuffiness (congestion). It also helps soften the skin around your nose.  Drink enough fluid to keep your pee (urine) pale yellow. General instructions   Rest as much as possible.  Do not drink alcohol.  Do not use any products that have nicotine or tobacco, such as cigarettes and e-cigarettes. If you need help quitting, ask your doctor.  Keep all follow-up visits as told by  your doctor. This is important. How is this prevented?   Get a flu shot every year. Ask your doctor when you should get your flu shot.  Do not let other people get your germs. If you are sick: ? Stay home from work or school. ? Wash your hands with soap and water often. Wash your hands after you cough or sneeze. If soap and water are not available, use hand sanitizer.  Avoid contact with people who are sick during cold and flu season. This is in fall and winter. Get help if:  Your symptoms last for 10 days or longer.  Your symptoms get worse over time.  You have a fever.  You have very bad pain in your face or forehead.  Parts of your jaw or neck become very swollen. Get help right away if:  You feel pain or pressure in your chest.  You have shortness of breath.  You faint or feel like you will faint.  You keep throwing up (vomiting).  You feel confused. Summary  A viral respiratory infection is an illness that affects parts of the body that are used for breathing.  Examples of this illness include a cold, the flu, and respiratory syncytial virus (RSV) infection.  The infection can cause a runny nose, cough, sneezing, sore throat, and fever.  Follow what your doctor tells you about taking medicines, drinking lots of fluid, washing your hands, resting at home, and avoiding people who are  sick. This information is not intended to replace advice given to you by your health care provider. Make sure you discuss any questions you have with your health care provider. Document Released: 12/27/2007 Document Revised: 02/23/2017 Document Reviewed: 02/23/2017 Elsevier Interactive Patient Education  2019 [employer].

## 2018-02-19 NOTE — Progress Notes (Signed)
[facility] is a 48 y.o. female with the following history as recorded in EpicCare:  Patient Active Problem List   Diagnosis Date Noted  . Hyperlipidemia associated with type 2 diabetes mellitus ([place]) 08/10/2017  . Allergic reaction to food 02/04/2017  . Adjustment disorder with mixed anxiety and depressed mood 08/07/2016  . Degenerative arthritis of knee, bilateral 02/11/2016  . Routine general medical examination at a health care facility 08/16/2015  . Unspecified sleep apnea 06/02/2013  . Essential hypertension 06/14/2012  . Controlled type 2 diabetes mellitus with hypoglycemia ([place]) 06/14/2012  . Obesity, morbid ([place]) 06/14/2012    Current Outpatient Medications  Medication Sig Dispense Refill  . acetaminophen (TYLENOL) 500 MG tablet Take 1,000 mg by mouth every 6 (six) hours as needed for headache.    Marland Kitchen atorvastatin (LIPITOR) 40 MG tablet TAKE 1 TABLET BY MOUTH ONCE DAILY AT  6  PM (Patient taking differently: Take 40 mg by mouth daily at 6 PM. ) 90 tablet 3  . buPROPion (WELLBUTRIN XL) 150 MG 24 hr tablet Take 1 tablet (150 mg total) by mouth daily. 90 tablet 3  . CINNAMON PO Take 1 tablet by mouth daily.    Marland Kitchen DIPHENHYDRAMINE HCL, TOPICAL, (BENADRYL ITCH STOPPING) 2 % GEL Apply 1 application topically as needed.    Marland Kitchen EPINEPHrine 0.3 mg/0.3 mL IJ SOAJ injection Inject 0.3 mg into the muscle once as needed for anaphylaxis.    Marland Kitchen glipiZIDE (GLUCOTROL) 5 MG tablet Take 1 tablet (5 mg total) by mouth daily before breakfast. 30 tablet 0  . hydrochlorothiazide (MICROZIDE) 12.5 MG capsule Take 1 capsule (12.5 mg total) by mouth daily. 90 capsule 3  . hydrOXYzine (ATARAX/VISTARIL) 25 MG tablet Take 1 tablet (25 mg total) by mouth every 6 (six) hours. 20 tablet 0  . loratadine (CLARITIN) 10 MG tablet Take 1 tablet (10 mg total) by mouth daily. 14 tablet 0  . losartan (COZAAR) 50 MG tablet Take 1 tablet (50 mg total) by mouth daily. 90 tablet 3  . meloxicam (MOBIC) 15 MG tablet Take 1 tablet  (15 mg total) by mouth daily. 30 tablet 0  . methocarbamol (ROBAXIN) 500 MG tablet Take 1 tablet (500 mg total) by mouth every 8 (eight) hours as needed. 30 tablet 0  . montelukast (SINGULAIR) 10 MG tablet     . ondansetron (ZOFRAN ODT) 4 MG disintegrating tablet Take 1 tablet (4 mg total) by mouth every 8 (eight) hours as needed for nausea or vomiting. 35 tablet 3  . TURMERIC PO Take 1 tablet by mouth daily.     No current facility-administered medications for this visit.     Allergies: Other; Shellfish allergy; Codeine; Menthol; Morphine and related; Penicillins; Sudafed [pseudoephedrine hcl]; Azithromycin; and Erythromycin  Past Medical History:  Diagnosis Date  . Arthritis    knees  . Depression   . Diabetes mellitus without complication ([place])   . Hypertension   . Migraines   . Ulcer     Past Surgical History:  Procedure Laterality Date  . CESAREAN SECTION  1993  . HYSTEROSCOPY WITH NOVASURE N/A 06/02/2016   Procedure: HYSTEROSCOPY WITH NOVASURE;  Surgeon: Paula Compton, MD;  Location: [facility];  Service: Gynecology;  Laterality: N/A;  . TUBAL LIGATION  1993    Family History  Problem Relation Age of Onset  . Diabetes Mother   . Hypertension Mother   . Heart disease Mother   . Hyperlipidemia Mother   . Diabetes Father   . Hypertension Father   .  Heart disease Father   . Hyperlipidemia Father   . Cervical cancer Sister   . Heart disease Sister   . Diabetes Sister   . Heart disease Sister   . Diabetes Sister     Social History   Tobacco Use  . Smoking status: Never Smoker  . Smokeless tobacco: Never Used  Substance Use Topics  . Alcohol use: Yes    Comment: occ     Subjective:  Lynn Fields is here today requesting evaluation of acute complaint of cough/cold symptoms, which first began when she woke this morning. Reports: chills, headaches, ear pressure, sore throat, malaise, body aches Denies: syncope, confusion, cough, chest pain, shortness of breath, wheezing,  Nausea,vomiting, diarrhea Smoker? No  Tried at home: tylenol, cough drops Several coworkers sick with flu/strep  ROS - See HPI  Objective:  Vitals:   02/19/18 1558  BP: 120/82  Pulse: (!) 104  Temp: 99 F (37.2 C)  TempSrc: Oral  SpO2: 98%  Weight: 279 lb (126.6 kg)  Height: 5' (1.524 m)    General: Well developed, well nourished, in no acute distress  Skin : Warm and dry.  Head: Normocephalic and atraumatic  Eyes: Sclera and conjunctiva clear; pupils round and reactive to light; extraocular movements intact  Ears: External normal; canals clear; tympanic membranes normal  Oropharynx: Mild posterior oropharyngeal erythema without edema or exudate. No suspicious lesions  Neck: Supple without thyromegaly, adenopathy  Lungs: Respirations unlabored; clear to auscultation bilaterally without wheeze, rales, rhonchi  CVS exam: normal rate and regular rhythm, S1 and S2 normal.  Extremities: No edema, cyanosis, clubbing  Vessels: Symmetric bilaterally  Neurologic: Alert and oriented; speech intact; face symmetrical; moves all extremities well; CNII-XII intact without focal deficit  Psychiatric: Normal mood and affect.   Assessment:  1. Sore throat   2. Flu-like symptoms   3. Viral URI     Plan:   Negative Strep, flu testing today Symptoms are likely viral at this point, which we discussed Home management, OTC medications, red flags and return precautions including when to seek immediate care discussed and printed on AVS She will f/u if symptoms persist beyond 1 week  No follow-ups on file.  Orders Placed This Encounter  Procedures  . POCT rapid strep A  . POC Influenza A&B (Binax test)    Requested Prescriptions    No prescriptions requested or ordered in this encounter

## 2018-02-22 ENCOUNTER — Other Ambulatory Visit: Payer: Self-pay | Admitting: Nurse Practitioner

## 2018-02-22 ENCOUNTER — Encounter: Payer: Self-pay | Admitting: Internal Medicine

## 2018-02-22 MED ORDER — BENZONATATE 150 MG PO CAPS: 1.0000 | ORAL_CAPSULE | Freq: Three times a day (TID) | ORAL | 0 refills | Status: DC

## 2018-02-22 MED ORDER — PROMETHAZINE-DM 6.25-15 MG/5ML PO SYRP: 5.0000 mL | ORAL_SOLUTION | Freq: Four times a day (QID) | ORAL | 0 refills | Status: DC | PRN

## 2018-02-22 NOTE — Addendum Note (Signed)
Addended by: Lance Sell on: 02/22/2018 05:23 PM   Modules accepted: Orders

## 2018-02-22 NOTE — Telephone Encounter (Signed)
Pt saw Ashleigh on Friday will forward to her since dr. Sharlet [place] is still out of the office. Pls advise.Marland KitchenJohny Chess

## 2018-03-09 ENCOUNTER — Encounter: Payer: Self-pay | Admitting: Internal Medicine

## 2018-03-09 NOTE — Progress Notes (Signed)
Abstracted and sent to scan  

## 2018-04-08 ENCOUNTER — Other Ambulatory Visit: Payer: Self-pay | Admitting: Internal Medicine

## 2018-04-08 ENCOUNTER — Other Ambulatory Visit: Payer: Managed Care, Other (non HMO)

## 2018-04-08 DIAGNOSIS — E118 Type 2 diabetes mellitus with unspecified complications: Secondary | ICD-10-CM

## 2018-04-08 DIAGNOSIS — E11649 Type 2 diabetes mellitus with hypoglycemia without coma: Secondary | ICD-10-CM

## 2018-04-08 DIAGNOSIS — Z794 Long term (current) use of insulin: Principal | ICD-10-CM

## 2018-04-09 ENCOUNTER — Encounter: Payer: Self-pay | Admitting: Internal Medicine

## 2018-04-09 LAB — HEMOGLOBIN A1C
EAG (MMOL/L): 14.1 (calc)
Hgb A1c MFr Bld: 10.5 % of total Hgb — ABNORMAL HIGH (ref <5.7)
MEAN PLASMA GLUCOSE: 255 (calc)

## 2018-04-12 ENCOUNTER — Other Ambulatory Visit: Payer: Self-pay

## 2018-04-12 ENCOUNTER — Encounter: Payer: Self-pay | Admitting: Internal Medicine

## 2018-04-12 ENCOUNTER — Ambulatory Visit: Payer: Managed Care, Other (non HMO) | Admitting: Internal Medicine

## 2018-04-12 DIAGNOSIS — E1165 Type 2 diabetes mellitus with hyperglycemia: Secondary | ICD-10-CM

## 2018-04-12 MED ORDER — METFORMIN HCL 500 MG PO TABS: 500.0000 mg | ORAL_TABLET | Freq: Two times a day (BID) | ORAL | 3 refills | Status: DC

## 2018-04-12 NOTE — Assessment & Plan Note (Signed)
Likely due to multiple prednisone courses in the last 6 months. She will start metformin 500 mg BID. She will contact us in about 1 month and we can add glipizide if needed. She is working on diet and exercise as well. On ARB and statin.

## 2018-04-12 NOTE — Patient Instructions (Signed)
We have sent in the metformin to start back taking.   Let us know from mychart in a couple weeks or month.

## 2018-04-12 NOTE — Progress Notes (Signed)
   Subjective:   Patient ID: Lynn Fields, female    DOB: 1970/03/19, 48 y.o.   MRN: [id number]  HPI The patient is a 48 YO female coming in for follow up of blood sugars. She did labs last week per her request and she had very high HgA1c. She previously had been controlled but has no follow up in a while. She denies change in diet. Still no sodas. She has had several allergic reactions in the last 6 months and did several courses of steroids which she knows is associated with higher sugars. Tested this weekend and it was in the 400s. She started taking some old metformin and sugars were down to 230 this morning.   Review of Systems  Constitutional: Negative.   HENT: Negative.   Eyes: Negative.   Respiratory: Negative for cough, chest tightness and shortness of breath.   Cardiovascular: Negative for chest pain, palpitations and leg swelling.  Gastrointestinal: Negative for abdominal distention, abdominal pain, constipation, diarrhea, nausea and vomiting.  Musculoskeletal: Negative.   Skin: Negative.   Neurological: Negative.   Psychiatric/Behavioral: Negative.     Objective:  Physical Exam Constitutional:      Appearance: She is well-developed. She is obese.  HENT:     Head: Normocephalic and atraumatic.  Neck:     Musculoskeletal: Normal range of motion.  Cardiovascular:     Rate and Rhythm: Normal rate and regular rhythm.  Pulmonary:     Effort: Pulmonary effort is normal. No respiratory distress.     Breath sounds: Normal breath sounds. No wheezing or rales.  Abdominal:     General: Bowel sounds are normal. There is no distension.     Palpations: Abdomen is soft.     Tenderness: There is no abdominal tenderness. There is no rebound.  Skin:    General: Skin is warm and dry.  Neurological:     Mental Status: She is alert and oriented to person, place, and time.     Coordination: Coordination normal.     Vitals:   04/12/18 0754  BP: 140/90  Pulse: 74  Temp: 98.4 F  (36.9 C)  TempSrc: Oral  SpO2: 97%  Weight: 273 lb (123.8 kg)  Height: 5' (1.524 m)    Assessment & Plan:

## 2018-04-20 ENCOUNTER — Encounter: Payer: Self-pay | Admitting: Internal Medicine

## 2018-04-20 MED ORDER — GLIPIZIDE 5 MG PO TABS: 5.0000 mg | ORAL_TABLET | Freq: Every day | ORAL | 3 refills | Status: DC

## 2018-05-04 ENCOUNTER — Encounter: Payer: Self-pay | Admitting: Internal Medicine

## 2018-05-27 ENCOUNTER — Encounter: Payer: Self-pay | Admitting: Internal Medicine

## 2018-05-28 ENCOUNTER — Encounter: Payer: Self-pay | Admitting: Internal Medicine

## 2018-05-28 ENCOUNTER — Ambulatory Visit (INDEPENDENT_AMBULATORY_CARE_PROVIDER_SITE_OTHER): Payer: Managed Care, Other (non HMO) | Admitting: Internal Medicine

## 2018-05-28 DIAGNOSIS — F4323 Adjustment disorder with mixed anxiety and depressed mood: Secondary | ICD-10-CM

## 2018-05-28 NOTE — Progress Notes (Signed)
Virtual Visit via Video Note  I connected with Lynn Fields on 05/28/18 at 11:00 AM EDT by a video enabled telemedicine application and verified that I am speaking with the correct person using two identifiers.   I discussed the limitations of evaluation and management by telemedicine and the availability of in person appointments. The patient expressed understanding and agreed to proceed.  History of Present Illness: The patient is a 48 y.o. female with visit for depression and anxiety. Rare thoughts that world would be better without her. No plan for action and would not self-harm. Is having more depression and hard to motivate. Is coping with food but not happy. Not exercising. Problems sleeping and only sleeping 1-2 hours here and there. Mood is labile during the day. She is having some panic episodes. . Started in the last several months with the pandemic. Denies missing wellbutrin but feels it is not working as well lately. Overall it is worsening. Has tried wellbutrin which is not helping enough.    Office Visit from 05/28/2018 in [place]  PHQ-9 Total Score  23     Observations/Objective: Appearance: normal, breathing appears normal, work grooming, abdomen does not appear distended, throat normal, memory normal, mental status is A and O times 3  Assessment and Plan: See problem oriented charting  Follow Up Instructions: increase wellbutrin to 300 mg daily, can use hydroxyzine for sleep and panic attacks  I discussed the assessment and treatment plan with the patient. The patient was provided an opportunity to ask questions and all were answered. The patient agreed with the plan and demonstrated an understanding of the instructions.   The patient was advised to call back or seek an in-person evaluation if the symptoms worsen or if the condition fails to improve as anticipated.  Hoyt Koch, MD

## 2018-05-28 NOTE — Assessment & Plan Note (Signed)
Increase wellbutrin to 300mg  daily. Use hydroxyzine for sleep and anxiety as needed. Advised on coping skills and exercise to help as well.

## 2018-06-08 ENCOUNTER — Ambulatory Visit: Payer: Managed Care, Other (non HMO) | Admitting: Family Medicine

## 2018-06-08 ENCOUNTER — Other Ambulatory Visit: Payer: Self-pay

## 2018-06-08 ENCOUNTER — Encounter: Payer: Self-pay | Admitting: Family Medicine

## 2018-06-08 DIAGNOSIS — M170 Bilateral primary osteoarthritis of knee: Secondary | ICD-10-CM

## 2018-06-08 NOTE — Assessment & Plan Note (Signed)
Bilateral injections given today.  Tolerated the procedure well.  Discussed icing regimen and home exercise.  Discussed which activities of doing which wants to avoid.  Patient is to increase activity slowly over the course the next several days.  Follow-up again 4 weeks.  Could be a candidate for a repeat of viscosupplementation

## 2018-06-08 NOTE — Progress Notes (Signed)
[name] [place] [place], [place] [address] Phone: [phone] Subjective:   [name], am serving as a scribe for Dr. Hulan Saas.   CC: Bilateral knee pain follow-up  RJJ:[id number]  Lynn Fields is a 48 y.o. female coming in with complaint of bilateral knee pain. Patient is here today for injections. Knee pain has become constant.  Last injection was Orthovisc on 12/2017.  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  Secondary to patient's body habitus.  Worsening pain and affecting daily activities    Past Medical History:  Diagnosis Date  . Arthritis    knees  . Depression   . Diabetes mellitus without complication ([place])   . Hypertension   . Migraines   . Ulcer    Past Surgical History:  Procedure Laterality Date  . CESAREAN SECTION  1993  . HYSTEROSCOPY WITH NOVASURE N/A 06/02/2016   Procedure: HYSTEROSCOPY WITH NOVASURE;  Surgeon: Paula Compton, MD;  Location: [facility];  Service: Gynecology;  Laterality: N/A;  . TUBAL LIGATION  1993   Social History   Socioeconomic History  . Marital status: Married    Spouse name: Not on file  . Number of children: Not on file  . Years of education: 83  . Highest education level: Not on file  Occupational History  . Occupation: Product manager: [employer]  . Financial resource strain: Not on file  . Food insecurity:    Worry: Not on file    Inability: Not on file  . Transportation needs:    Medical: Not on file    Non-medical: Not on file  Tobacco Use  . Smoking status: Never Smoker  . Smokeless tobacco: Never Used  Substance and Sexual Activity  . Alcohol use: Yes    Comment: occ  . Drug use: No  . Sexual activity: Yes    Birth control/protection: Surgical  Lifestyle  . Physical activity:    Days per week: Not on file    Minutes per session: Not on file  . Stress: Not on file  Relationships  . Social connections:    Talks on phone:  Not on file    Gets together: Not on file    Attends religious service: Not on file    Active member of club or organization: Not on file    Attends meetings of clubs or organizations: Not on file    Relationship status: Not on file  Other Topics Concern  . Not on file  Social History Narrative   Regular exercise-yes   Caffeine Use-no   Allergies  Allergen Reactions  . Other Anaphylaxis    Hair dye  . Shellfish Allergy Anaphylaxis    Closes throat  . Codeine Itching  . Menthol Hives  . Morphine And Related Itching  . Penicillins Swelling    Has patient had a PCN reaction causing immediate rash, facial/tongue/throat swelling, SOB or lightheadedness with hypotension: Yes Has patient had a PCN reaction causing severe rash involving mucus membranes or skin necrosis: No Has patient had a PCN reaction that required hospitalization No Has patient had a PCN reaction occurring within the last 10 years: No If all of the above answers are "NO", then may proceed with Cephalosporin use.  [place]bbie [place] [Pseudoephedrine Hcl] Swelling  . Azithromycin Rash  . Erythromycin Rash   Family History  Problem Relation Age of Onset  . Diabetes Mother   . Hypertension Mother   .  Heart disease Mother   . Hyperlipidemia Mother   . Diabetes Father   . Hypertension Father   . Heart disease Father   . Hyperlipidemia Father   . Cervical cancer Sister   . Heart disease Sister   . Diabetes Sister   . Heart disease Sister   . Diabetes Sister     Current Outpatient Medications (Endocrine & Metabolic):  .  glipiZIDE (GLUCOTROL) 5 MG tablet, Take 1 tablet (5 mg total) by mouth daily before breakfast. .  metFORMIN (GLUCOPHAGE) 500 MG tablet, Take 1 tablet (500 mg total) by mouth 2 (two) times daily with a meal.  Current Outpatient Medications (Cardiovascular):  .  atorvastatin (LIPITOR) 40 MG tablet, TAKE 1 TABLET BY MOUTH ONCE DAILY AT  6  PM (Patient taking differently: Take 40 mg by mouth daily at 6 PM.  ) .  EPINEPHrine 0.3 mg/0.3 mL IJ SOAJ injection, Inject 0.3 mg into the muscle once as needed for anaphylaxis. .  hydrochlorothiazide (MICROZIDE) 12.5 MG capsule, Take 1 capsule (12.5 mg total) by mouth daily. Marland Kitchen  losartan (COZAAR) 50 MG tablet, Take 1 tablet (50 mg total) by mouth daily.  Current Outpatient Medications (Respiratory):  .  loratadine (CLARITIN) 10 MG tablet, Take 1 tablet (10 mg total) by mouth daily. .  montelukast (SINGULAIR) 10 MG tablet,   Current Outpatient Medications (Analgesics):  .  acetaminophen (TYLENOL) 500 MG tablet, Take 1,000 mg by mouth every 6 (six) hours as needed for headache. .  meloxicam (MOBIC) 15 MG tablet, Take 1 tablet (15 mg total) by mouth daily.   Current Outpatient Medications (Other):  Marland Kitchen  buPROPion (WELLBUTRIN XL) 300 MG 24 hr tablet, Take 300 mg by mouth daily. Marland Kitchen  CINNAMON PO, Take 1 tablet by mouth daily. Marland Kitchen  DIPHENHYDRAMINE HCL, TOPICAL, (BENADRYL ITCH STOPPING) 2 % GEL, Apply 1 application topically as needed. .  hydrOXYzine (ATARAX/VISTARIL) 25 MG tablet, Take 1 tablet (25 mg total) by mouth every 6 (six) hours. .  ondansetron (ZOFRAN ODT) 4 MG disintegrating tablet, Take 1 tablet (4 mg total) by mouth every 8 (eight) hours as needed for nausea or vomiting. .  TURMERIC PO, Take 1 tablet by mouth daily.    Past medical history, social, surgical and family history all reviewed in electronic medical record.  No pertanent information unless stated regarding to the chief complaint.   Review of Systems:  No headache, visual changes, nausea, vomiting, diarrhea, constipation, dizziness, abdominal pain, skin rash, fevers, chills, night sweats, weight loss, swollen lymph nodes, body aches,, chest pain, shortness of breath, mood changes.  Positive muscle aches and joint swelling  Objective  Blood pressure 124/84, pulse 91, height 5' (1.524 m), weight 273 lb (123.8 kg), SpO2 97 %.    General: No apparent distress alert and oriented x3 mood and  affect normal, dressed appropriately.  Morbidly obese HEENT: Pupils equal, extraocular movements intact  Respiratory: Patient's speak in full sentences and does not appear short of breath  Cardiovascular: 1+ lower extremity edema, non tender, no erythema  Skin: Warm dry intact with no signs of infection or rash on extremities or on axial skeleton.  Abdomen: Soft nontender  Neuro: Cranial nerves II through XII are intact, neurovascularly intact in all extremities with 2+ DTRs and 2+ pulses.  Lymph: No lymphadenopathy of posterior or anterior cervical chain or axillae bilaterally.  Gait antalgic MSK:  Non tender with full range of motion and good stability and symmetric strength and tone of shoulders, elbows, wrist, hip  and ankles bilaterally.  Knee: Bilateral valgus deformity noted. Large thigh to calf ratio.  Difficult to assess secondary to patient's body habitus Tender to palpation over medial and PF joint line.  ROM full in flexion and extension and lower leg rotation. instability with valgus force.  painful patellar compression. Patellar glide with moderate crepitus. Patellar and quadriceps tendons unremarkable. Hamstring and quadriceps strength is normal.  After informed written and verbal consent, patient was seated on exam table. Right knee was prepped with alcohol swab and utilizing anterolateral approach, patient's right knee space was injected with 4:1  marcaine 0.5%: Kenalog 40mg /dL. Patient tolerated the procedure well without immediate complications.  After informed written and verbal consent, patient was seated on exam table. Left knee was prepped with alcohol swab and utilizing anterolateral approach, patient's left knee space was injected with 4:1  marcaine 0.5%: Kenalog 40mg /dL. Patient tolerated the procedure well without immediate complications.    Impression and Recommendations:     This case required medical decision making of moderate complexity. The above  documentation has been reviewed and is accurate and complete Lyndal Pulley, DO       Note: This dictation was prepared with Dragon dictation along with smaller phrase technology. Any transcriptional errors that result from this process are unintentional.

## 2018-06-08 NOTE — Patient Instructions (Signed)
God to see you  Keep trucking along  See me again in 4 weeks Stay safe!

## 2018-06-13 ENCOUNTER — Emergency Department (HOSPITAL_COMMUNITY)
Admission: EM | Admit: 2018-06-13 | Discharge: 2018-06-13 | Disposition: A | Discharge status: Home / Self Care | Payer: Managed Care, Other (non HMO) | Attending: Emergency Medicine | Admitting: Emergency Medicine

## 2018-06-13 ENCOUNTER — Other Ambulatory Visit: Payer: Self-pay

## 2018-06-13 ENCOUNTER — Encounter (HOSPITAL_COMMUNITY): Payer: Self-pay | Admitting: Emergency Medicine

## 2018-06-13 DIAGNOSIS — E1165 Type 2 diabetes mellitus with hyperglycemia: Secondary | ICD-10-CM | POA: Insufficient documentation

## 2018-06-13 DIAGNOSIS — R739 Hyperglycemia, unspecified: Secondary | ICD-10-CM

## 2018-06-13 DIAGNOSIS — I10 Essential (primary) hypertension: Secondary | ICD-10-CM | POA: Diagnosis not present

## 2018-06-13 DIAGNOSIS — Z7984 Long term (current) use of oral hypoglycemic drugs: Secondary | ICD-10-CM | POA: Diagnosis not present

## 2018-06-13 LAB — URINALYSIS, ROUTINE W REFLEX MICROSCOPIC
Bacteria, UA: NONE SEEN
Bilirubin Urine: NEGATIVE
Glucose, UA: >=500 mg/dL — AB
Hgb urine dipstick: NEGATIVE
Ketones, ur: NEGATIVE mg/dL
Leukocytes,Ua: NEGATIVE
Nitrite: NEGATIVE
Protein, ur: NEGATIVE mg/dL
Specific Gravity, Urine: 1.028 (ref 1.005–1.030)
pH: 5 (ref 5.0–8.0)

## 2018-06-13 LAB — CBC
HCT: 40.7 % (ref 36.0–46.0)
Hemoglobin: 14 g/dL (ref 12.0–15.0)
MCH: 28.8 pg (ref 26.0–34.0)
MCHC: 34.4 g/dL (ref 30.0–36.0)
MCV: 83.7 fL (ref 80.0–100.0)
Platelets: 314 10*3/uL (ref 150–400)
RBC: 4.86 MIL/uL (ref 3.87–5.11)
RDW: 13.8 % (ref 11.5–15.5)
WBC: 11.1 10*3/uL — ABNORMAL HIGH (ref 4.0–10.5)
nRBC: 0 % (ref 0.0–0.2)

## 2018-06-13 LAB — BASIC METABOLIC PANEL
Anion gap: 10 (ref 5–15)
BUN: 15 mg/dL (ref 6–20)
CO2: 24 mmol/L (ref 22–32)
Calcium: 9.2 mg/dL (ref 8.9–10.3)
Chloride: 102 mmol/L (ref 98–111)
Creatinine, Ser: 0.83 mg/dL (ref 0.44–1.00)
GFR calc Af Amer: >60 mL/min (ref >60)
GFR calc non Af Amer: >60 mL/min (ref >60)
Glucose, Bld: 374 mg/dL — ABNORMAL HIGH (ref 70–99)
Potassium: 3.8 mmol/L (ref 3.5–5.1)
Sodium: 136 mmol/L (ref 135–145)

## 2018-06-13 LAB — CBG MONITORING, ED
Glucose-Capillary: 334 mg/dL — ABNORMAL HIGH (ref 70–99)
Glucose-Capillary: 271 mg/dL — ABNORMAL HIGH (ref 70–99)

## 2018-06-13 MED ORDER — LACTATED RINGERS IV BOLUS
1000.0000 mL | Freq: Once | INTRAVENOUS | Status: AC
  Administered 2018-06-13: 1000 mL via INTRAVENOUS

## 2018-06-13 MED ORDER — INSULIN ASPART 100 UNIT/ML ~~LOC~~ SOLN
5.0000 [IU] | Freq: Once | SUBCUTANEOUS | Status: AC
  Administered 2018-06-13: 02:00:00 5 [IU] via SUBCUTANEOUS
  Filled 2018-06-13: qty 1

## 2018-06-13 MED ORDER — ONDANSETRON HCL 4 MG/2ML IJ SOLN
4.0000 mg | Freq: Once | INTRAMUSCULAR | Status: AC
  Administered 2018-06-13: 4 mg via INTRAVENOUS
  Filled 2018-06-13: qty 2

## 2018-06-13 NOTE — ED Provider Notes (Signed)
Emergency Department Provider Note   I have reviewed the triage vital signs and the nursing notes.   HISTORY  Chief Complaint Hyperglycemia   HPI Lynn Fields is a 48 y.o. female with a history of diabetes on metformin who presents the emergency department today with hyperglycemia.  Patient states she feels slightly fatigued with some recent polyuria and nausea since her blood sugars have been above 200 but specifically tonight it was about 450 so she took another metformin rechecked it was still above 400 so she presents here for further evaluation.  She states he recently got a cortisol shot in her knees as she often has high blood sugar associated with that.  No illnesses, chest pain, shortness of breath, headache or other associated symptoms.   No other associated or modifying symptoms.    Past Medical History:  Diagnosis Date  . Arthritis    knees  . Depression   . Diabetes mellitus without complication ([place])   . Hypertension   . Migraines   . Ulcer     Patient Active Problem List   Diagnosis Date Noted  . Hyperlipidemia associated with type 2 diabetes mellitus ([place]) 08/10/2017  . Allergic reaction to food 02/04/2017  . Adjustment disorder with mixed anxiety and depressed mood 08/07/2016  . Degenerative arthritis of knee, bilateral 02/11/2016  . Routine general medical examination at a health care facility 08/16/2015  . Unspecified sleep apnea 06/02/2013  . Essential hypertension 06/14/2012  . Uncontrolled diabetes mellitus ([place]) 06/14/2012  . Obesity, morbid ([place]) 06/14/2012    Past Surgical History:  Procedure Laterality Date  . CESAREAN SECTION  1993  . HYSTEROSCOPY WITH NOVASURE N/A 06/02/2016   Procedure: HYSTEROSCOPY WITH NOVASURE;  Surgeon: Paula Compton, MD;  Location: [facility];  Service: Gynecology;  Laterality: N/A;  . TUBAL LIGATION  1993    Current Outpatient Rx  . Order #: [id number] Class: Historical Med  . Order #: [id number] Class: Normal  .  Order #: [id number] Class: Historical Med  . Order #: [id number] Class: Historical Med  . Order #: [id number] Class: Historical Med  . Order #: [id number] Class: Normal  . Order #: [id number] Class: Normal  . Order #: [id number] Class: Normal  . Order #: [id number] Class: Normal  . Order #: [id number] Class: Normal  . Order #: [id number] Class: Historical Med  . Order #: [id number] Class: Normal  . Order #: [id number] Class: Normal  . Order #: [id number] Class: Normal    Allergies Other; Shellfish allergy; Codeine; Menthol; Morphine and related; Penicillins; Sudafed [pseudoephedrine hcl]; Azithromycin; and Erythromycin  Family History  Problem Relation Age of Onset  . Diabetes Mother   . Hypertension Mother   . Heart disease Mother   . Hyperlipidemia Mother   . Diabetes Father   . Hypertension Father   . Heart disease Father   . Hyperlipidemia Father   . Cervical cancer Sister   . Heart disease Sister   . Diabetes Sister   . Heart disease Sister   . Diabetes Sister     Social History Social History   Tobacco Use  . Smoking status: Never Smoker  . Smokeless tobacco: Never Used  Substance Use Topics  . Alcohol use: Yes    Comment: occ  . Drug use: No    Review of Systems  All other systems negative except as documented in the HPI. All pertinent positives and negatives as reviewed in the HPI. ____________________________________________   PHYSICAL EXAM:  VITAL SIGNS: ED Triage Vitals  Enc Vitals Group     BP 06/13/18 0115 135/80  Pulse Rate 06/13/18 0115 90     Resp 06/13/18 0115 18     Temp 06/13/18 0115 98.3 F (36.8 C)     Temp Source 06/13/18 0115 Oral     SpO2 06/13/18 0115 100 %     Weight 06/13/18 0115 272 lb (123.4 kg)     Height 06/13/18 0115 5' (1.524 m)    Constitutional: Alert and oriented. Well appearing and in no acute distress. Eyes: Conjunctivae are normal. PERRL. EOMI. Head: Atraumatic. Nose: No congestion/rhinnorhea. Mouth/Throat: Mucous membranes are  moist.  Oropharynx non-erythematous. Neck: No stridor.  No meningeal signs.   Cardiovascular: Normal rate, regular rhythm. Good peripheral circulation. Grossly normal heart sounds.   Respiratory: Normal respiratory effort.  No retractions. Lungs CTAB. Gastrointestinal: Soft and nontender. No distention.  Musculoskeletal: No lower extremity tenderness nor edema. No gross deformities of extremities. Neurologic:  Normal speech and language. No gross focal neurologic deficits are appreciated.  Skin:  Skin is warm, dry and intact. No rash noted.  ____________________________________________   LABS (all labs ordered are listed, but only abnormal results are displayed)  Labs Reviewed  BASIC METABOLIC PANEL - Abnormal; Notable for the following components:      Result Value   Glucose, Bld 374 (*)    All other components within normal limits  CBC - Abnormal; Notable for the following components:   WBC 11.1 (*)    All other components within normal limits  URINALYSIS, ROUTINE W REFLEX MICROSCOPIC - Abnormal; Notable for the following components:   Color, Urine STRAW (*)    Glucose, UA >=500 (*)    All other components within normal limits  CBG MONITORING, ED - Abnormal; Notable for the following components:   Glucose-Capillary 334 (*)    All other components within normal limits  CBG MONITORING, ED - Abnormal; Notable for the following components:   Glucose-Capillary 271 (*)    All other components within normal limits  CBG MONITORING, ED   ____________________________________________  EKG   EKG Interpretation  Date/Time:  Sunday Jun 13 2018 02:09:00 EDT Ventricular Rate:  82 PR Interval:    QRS Duration: 79 QT Interval:  348 QTC Calculation: 407 R Axis:   50 Text Interpretation:  Sinus rhythm Baseline wander in lead(s) II III aVL aVF Confirmed by Merrily Pew [phone]) on 06/13/2018 3:18:19 AM       ____________________________________________  RADIOLOGY  No results  found.  ____________________________________________   PROCEDURES  Procedure(s) performed:   Procedures   ____________________________________________   INITIAL IMPRESSION / ASSESSMENT AND PLAN / ED COURSE  Hyperglycemia likely from steroids.  No evidence of DKA.  Improved significantly with fluids and insulin here as did her symptoms.  Patient stable for discharge with PCP follow-up for persistent high blood sugar   Pertinent labs & imaging results that were available during my care of the patient were reviewed by me and considered in my medical decision making (see chart for details).  A medical screening exam was performed and I feel the patient has had an appropriate workup for their chief complaint at this time and likelihood of emergent condition existing is low. They have been counseled on decision, discharge, follow up and which symptoms necessitate immediate return to the emergency department. They or their family verbally stated understanding and agreement with plan and discharged in stable condition.   ____________________________________________  FINAL CLINICAL IMPRESSION(S) / ED DIAGNOSES  Final diagnoses:  Hyperglycemia     MEDICATIONS GIVEN DURING THIS VISIT:  Medications  lactated ringers bolus 1,000 mL (0 mLs Intravenous Stopped 06/13/18 0335)  insulin aspart (novoLOG) injection 5 Units (5 Units Subcutaneous Given 06/13/18 0202)  ondansetron (ZOFRAN) injection 4 mg (4 mg Intravenous Given 06/13/18 0202)     NEW OUTPATIENT MEDICATIONS STARTED DURING THIS VISIT:  Discharge Medication List as of 06/13/2018  3:42 AM      Note:  This note was prepared with assistance of Dragon voice recognition software. Occasional wrong-word or sound-a-like substitutions may have occurred due to the inherent limitations of voice recognition software.   Rhen Dossantos, [name], MD 06/13/18 [phone]

## 2018-06-13 NOTE — ED Triage Notes (Signed)
Patient states she took her bs at 11:00 and it was 417. Patient states she took another metformin. Then right before she came to the hospital it was bs-419. Patient states she is nauseas and dont feel good.

## 2018-06-13 NOTE — ED Notes (Signed)
ED Provider at bedside. 

## 2018-06-15 ENCOUNTER — Encounter: Payer: Self-pay | Admitting: Internal Medicine

## 2018-06-16 ENCOUNTER — Ambulatory Visit: Payer: Managed Care, Other (non HMO) | Admitting: Family Medicine

## 2018-07-06 ENCOUNTER — Ambulatory Visit: Payer: Managed Care, Other (non HMO) | Admitting: Family Medicine

## 2018-07-06 ENCOUNTER — Other Ambulatory Visit: Payer: Self-pay

## 2018-07-06 ENCOUNTER — Encounter: Payer: Self-pay | Admitting: Family Medicine

## 2018-07-06 ENCOUNTER — Ambulatory Visit (INDEPENDENT_AMBULATORY_CARE_PROVIDER_SITE_OTHER)
Admission: RE | Admit: 2018-07-06 | Discharge: 2018-07-06 | Disposition: A | Discharge status: Home / Self Care | Payer: Managed Care, Other (non HMO) | Source: Ambulatory Visit | Attending: Family Medicine | Admitting: Family Medicine

## 2018-07-06 ENCOUNTER — Ambulatory Visit: Payer: Self-pay

## 2018-07-06 VITALS — BP 112/76 | HR 80 | Ht 60.0 in | Wt 267.0 lb

## 2018-07-06 DIAGNOSIS — M79641 Pain in right hand: Secondary | ICD-10-CM | POA: Diagnosis not present

## 2018-07-06 DIAGNOSIS — M170 Bilateral primary osteoarthritis of knee: Secondary | ICD-10-CM

## 2018-07-06 NOTE — Patient Instructions (Signed)
Wear splint nightly for 2 weeks Will call you if we need to see you sooner based on xray Gave 2 visco injections today See me in 6 weeks

## 2018-07-06 NOTE — Assessment & Plan Note (Signed)
Degenerative changes.  Bilateral injection with viscosupplementation given today.  Tolerated procedure well.  Discussed icing regimen, topical anti-inflammatories and importance of weight loss.  Follow-up again in 4 weeks

## 2018-07-06 NOTE — Progress Notes (Signed)
[name] [address] [place], [place] [address] Phone: [phone] Subjective:   Lynn Fields, am serving as a scribe for Dr. Hulan Saas.  I'm seeing this patient by the request  of:    CC: Bilateral neck pain and hand pain  [id number]  Lynn Fields is a 48 y.o. female coming in with complaint of bilateral knee pain. Is here for visco injections.  Patient is failed all other conservative therapy.  Does have severe arthritic changes.  Also punched a wall on Sunday. Pain on right hand 2nd metacarpal. Tender to palpation and dull constant ache. Had significant swelling and bruising first time but is improving.  Still having pain with range of motion.     Past Medical History:  Diagnosis Date  . Arthritis    knees  . Depression   . Diabetes mellitus without complication ([place])   . Hypertension   . Migraines   . Ulcer    Past Surgical History:  Procedure Laterality Date  . CESAREAN SECTION  1993  . HYSTEROSCOPY WITH NOVASURE N/A 06/02/2016   Procedure: HYSTEROSCOPY WITH NOVASURE;  Surgeon: Paula Compton, MD;  Location: [facility];  Service: Gynecology;  Laterality: N/A;  . TUBAL LIGATION  1993   Social History   Socioeconomic History  . Marital status: Married    Spouse name: Not on file  . Number of children: Not on file  . Years of education: 69  . Highest education level: Not on file  Occupational History  . Occupation: Product manager: [employer]  . Financial resource strain: Not on file  . Food insecurity:    Worry: Not on file    Inability: Not on file  . Transportation needs:    Medical: Not on file    Non-medical: Not on file  Tobacco Use  . Smoking status: Never Smoker  . Smokeless tobacco: Never Used  Substance and Sexual Activity  . Alcohol use: Yes    Comment: occ  . Drug use: Fields  . Sexual activity: Yes    Birth control/protection: Surgical  Lifestyle  . Physical activity:     Days per week: Not on file    Minutes per session: Not on file  . Stress: Not on file  Relationships  . Social connections:    Talks on phone: Not on file    Gets together: Not on file    Attends religious service: Not on file    Active member of club or organization: Not on file    Attends meetings of clubs or organizations: Not on file    Relationship status: Not on file  Other Topics Concern  . Not on file  Social History Narrative   Regular exercise-yes   Caffeine Use-Fields   Allergies  Allergen Reactions  . Other Anaphylaxis    Hair dye  . Shellfish Allergy Anaphylaxis    Closes throat  . Codeine Itching  . Menthol Hives  . Morphine And Related Itching  . Penicillins Swelling    Has patient had a PCN reaction causing immediate rash, facial/tongue/throat swelling, SOB or lightheadedness with hypotension: Yes Has patient had a PCN reaction causing severe rash involving mucus membranes or skin necrosis: Fields Has patient had a PCN reaction that required hospitalization Fields Has patient had a PCN reaction occurring within the last 10 years: Fields If all of the above answers are "Fields", then may proceed with Cephalosporin use.  Marland Kitchen  Sudafed [Pseudoephedrine Hcl] Swelling  . Azithromycin Rash  . Erythromycin Rash   Family History  Problem Relation Age of Onset  . Diabetes Mother   . Hypertension Mother   . Heart disease Mother   . Hyperlipidemia Mother   . Diabetes Father   . Hypertension Father   . Heart disease Father   . Hyperlipidemia Father   . Cervical cancer Sister   . Heart disease Sister   . Diabetes Sister   . Heart disease Sister   . Diabetes Sister     Current Outpatient Medications (Endocrine & Metabolic):  .  glipiZIDE (GLUCOTROL) 5 MG tablet, Take 1 tablet (5 mg total) by mouth daily before breakfast. .  metFORMIN (GLUCOPHAGE) 500 MG tablet, Take 1 tablet (500 mg total) by mouth 2 (two) times daily with a meal.  Current Outpatient Medications  (Cardiovascular):  .  atorvastatin (LIPITOR) 40 MG tablet, TAKE 1 TABLET BY MOUTH ONCE DAILY AT  6  PM (Patient taking differently: Take 40 mg by mouth daily at 6 PM. ) .  EPINEPHrine 0.3 mg/0.3 mL IJ SOAJ injection, Inject 0.3 mg into the muscle once as needed for anaphylaxis. .  hydrochlorothiazide (MICROZIDE) 12.5 MG capsule, Take 1 capsule (12.5 mg total) by mouth daily. Marland Kitchen  losartan (COZAAR) 50 MG tablet, Take 1 tablet (50 mg total) by mouth daily.  Current Outpatient Medications (Respiratory):  .  loratadine (CLARITIN) 10 MG tablet, Take 1 tablet (10 mg total) by mouth daily.  Current Outpatient Medications (Analgesics):  .  acetaminophen (TYLENOL) 500 MG tablet, Take 1,000 mg by mouth every 6 (six) hours as needed for headache. .  meloxicam (MOBIC) 15 MG tablet, Take 1 tablet (15 mg total) by mouth daily.   Current Outpatient Medications (Other):  Marland Kitchen  buPROPion (WELLBUTRIN XL) 300 MG 24 hr tablet, Take 300 mg by mouth daily. Marland Kitchen  CINNAMON PO, Take 1 tablet by mouth daily. .  hydrOXYzine (ATARAX/VISTARIL) 25 MG tablet, Take 1 tablet (25 mg total) by mouth every 6 (six) hours. .  ondansetron (ZOFRAN ODT) 4 MG disintegrating tablet, Take 1 tablet (4 mg total) by mouth every 8 (eight) hours as needed for nausea or vomiting. .  TURMERIC PO, Take 1 tablet by mouth daily.    Past medical history, social, surgical and family history all reviewed in electronic medical record.  Fields pertanent information unless stated regarding to the chief complaint.   Review of Systems:  Fields headache, visual changes, nausea, vomiting, diarrhea, constipation, dizziness, abdominal pain, skin rash, fevers, chills, night sweats, weight loss, swollen lymph nodes, body aches, joint swelling, muscle aches, chest pain, shortness of breath, mood changes.   Objective  Blood pressure 112/76, pulse 80, height 5' (1.524 m), weight 267 lb (121.1 kg), SpO2 99 %.    General: Fields apparent distress alert and oriented x3 mood  and affect normal, dressed appropriately.  Morbidly obese HEENT: Pupils equal, extraocular movements intact  Respiratory: Patient's speak in full sentences and does not appear short of breath  Cardiovascular: Fields lower extremity edema, non tender, Fields erythema  Skin: Warm dry intact with Fields signs of infection or rash on extremities or on axial skeleton.  Abdomen: Soft nontender  Neuro: Cranial nerves II through XII are intact, neurovascularly intact in all extremities with 2+ DTRs and 2+ pulses.  Lymph: Fields lymphadenopathy of posterior or anterior cervical chain or axillae bilaterally.  Gait antalgic MSK:  tender with full range of motion and good stability and symmetric strength and  tone of shoulders, elbows, wrist, hip and ankles bilaterally.  Knee: Bilateral valgus deformity noted. Large thigh to calf ratio.  Tender to palpation over medial and PF joint line.  ROM full in flexion and extension and lower leg rotation. instability with valgus force.  painful patellar compression. Patellar glide with moderate crepitus. Patellar and quadriceps tendons unremarkable. Hamstring and quadriceps strength is normal.  Hand exam shows the patient's index finger does have pain over the MCP but Fields significant deformity.  Full range of motion.  After informed written and verbal consent, patient was seated on exam table. Right knee was prepped with alcohol swab and utilizing anterolateral approach, patient's right knee space was injected with per mL L of Monovisc (sodium hyaluronate) in a prefilled syringe was injected easily into the knee through a 22-gauge needle..Patient tolerated the procedure well without immediate complications.  After informed written and verbal consent, patient was seated on exam table. Left knee was prepped with alcohol swab and utilizing anterolateral approach, patient's left knee space was injected with 22 mg/mL of Monovisc c(sodium hyaluronate) in a prefilled syringe was injected  easily into the knee through a 22-gauge needle..Patient tolerated the procedure well without immediate complications.    Impression and Recommendations:     This case required medical decision making of moderate complexity. The above documentation has been reviewed and is accurate and complete Lyndal Pulley, DO       Note: This dictation was prepared with Dragon dictation along with smaller phrase technology. Any transcriptional errors that result from this process are unintentional.

## 2018-07-06 NOTE — Assessment & Plan Note (Signed)
Right hand X-rays pending.  Discussed posture.  Discussed that likely this will be fine.  Ultrasound was fairly unremarkable.  Follow-up again in 4 weeks

## 2018-07-26 ENCOUNTER — Telehealth: Payer: Self-pay | Admitting: Internal Medicine

## 2018-07-26 NOTE — Telephone Encounter (Signed)
Patient states she has been waking up with a headache every day.  States her had has been shaking for a month.  She also has been having unexplained bruising on legs and arms.  Patient is requesting an in office appt.

## 2018-07-26 NOTE — Telephone Encounter (Signed)
Left pt vm to call back to schedule. 

## 2018-07-26 NOTE — Telephone Encounter (Signed)
Ok if symptom free otherwise needs to be virtual

## 2018-07-27 ENCOUNTER — Ambulatory Visit (INDEPENDENT_AMBULATORY_CARE_PROVIDER_SITE_OTHER): Payer: Managed Care, Other (non HMO) | Admitting: Internal Medicine

## 2018-07-27 ENCOUNTER — Other Ambulatory Visit: Payer: Self-pay

## 2018-07-27 ENCOUNTER — Encounter: Payer: Self-pay | Admitting: Internal Medicine

## 2018-07-27 ENCOUNTER — Other Ambulatory Visit (INDEPENDENT_AMBULATORY_CARE_PROVIDER_SITE_OTHER): Payer: Managed Care, Other (non HMO)

## 2018-07-27 VITALS — BP 118/80 | HR 73 | Temp 98.0°F | Ht 60.0 in | Wt 262.0 lb

## 2018-07-27 DIAGNOSIS — R630 Anorexia: Secondary | ICD-10-CM | POA: Insufficient documentation

## 2018-07-27 DIAGNOSIS — R519 Headache, unspecified: Secondary | ICD-10-CM | POA: Insufficient documentation

## 2018-07-27 DIAGNOSIS — G44221 Chronic tension-type headache, intractable: Secondary | ICD-10-CM | POA: Diagnosis not present

## 2018-07-27 DIAGNOSIS — E1165 Type 2 diabetes mellitus with hyperglycemia: Secondary | ICD-10-CM

## 2018-07-27 LAB — COMPREHENSIVE METABOLIC PANEL
ALT: 15 U/L (ref 0–35)
AST: 14 U/L (ref 0–37)
Albumin: 4 g/dL (ref 3.5–5.2)
Alkaline Phosphatase: 74 U/L (ref 39–117)
BUN: 14 mg/dL (ref 6–23)
CO2: 28 mEq/L (ref 19–32)
Calcium: 9.1 mg/dL (ref 8.4–10.5)
Chloride: 104 mEq/L (ref 96–112)
Creatinine, Ser: 0.81 mg/dL (ref 0.40–1.20)
GFR: 92.21 mL/min (ref >60.00)
Glucose, Bld: 165 mg/dL — ABNORMAL HIGH (ref 70–99)
Potassium: 3.5 mEq/L (ref 3.5–5.1)
Sodium: 140 mEq/L (ref 135–145)
Total Bilirubin: 0.5 mg/dL (ref 0.2–1.2)
Total Protein: 7.1 g/dL (ref 6.0–8.3)

## 2018-07-27 LAB — CBC
HCT: 39.7 % (ref 36.0–46.0)
Hemoglobin: 13.3 g/dL (ref 12.0–15.0)
MCHC: 33.4 g/dL (ref 30.0–36.0)
MCV: 86.1 fl (ref 78.0–100.0)
Platelets: 276 10*3/uL (ref 150.0–400.0)
RBC: 4.61 Mil/uL (ref 3.87–5.11)
RDW: 14.3 % (ref 11.5–15.5)
WBC: 7.1 10*3/uL (ref 4.0–10.5)

## 2018-07-27 LAB — POCT GLYCOSYLATED HEMOGLOBIN (HGB A1C): Hemoglobin A1C: 7.7 % — AB (ref 4.0–5.6)

## 2018-07-27 LAB — LIPASE: Lipase: 29 U/L (ref 11.0–59.0)

## 2018-07-27 LAB — VITAMIN D 25 HYDROXY (VIT D DEFICIENCY, FRACTURES): VITD: 22.6 ng/mL — ABNORMAL LOW (ref 30.00–100.00)

## 2018-07-27 MED ORDER — PANTOPRAZOLE SODIUM 40 MG PO TBEC: 40.0000 mg | DELAYED_RELEASE_TABLET | Freq: Every day | ORAL | 3 refills | Status: DC

## 2018-07-27 MED ORDER — SUMATRIPTAN SUCCINATE 50 MG PO TABS: 50.0000 mg | ORAL_TABLET | ORAL | 0 refills | Status: DC | PRN

## 2018-07-27 NOTE — Assessment & Plan Note (Signed)
With resumption of glipizide and metformin HgA1c is 7.7 which is dramatically improved from prior but is not at goal of 7.5 currently. She has lost about 10 pounds in the last month unclear cause.

## 2018-07-27 NOTE — Patient Instructions (Signed)
We have sent in sumatriptan to take for headaches.   We have sent in protonix to take daily before breakfast to help the stomach. Within 1-2 weeks you should see benefit from this.   We are checking the blood work and may do an ultrasound of the stomach.

## 2018-07-27 NOTE — Progress Notes (Signed)
Subjective:   Patient ID: Lynn Fields, female    DOB: 09/15/1970, 48 y.o.   MRN: [id number]  HPI The patient is a 48 YO female coming in for concerns about headaches (around the clock for the last 2 weeks, accompanied by some night sweats during the same time period, denies fevers and has checked temp during night sweats, has been taking some otc meds to help and they work temporarily but not for long, does have morning headache and goes to sleep with headache, denies numbness or weakness, denies change in vision, denies aura with it) as well as follow up diabetes (is taking glipizide and metformin BID, denies problems with metformin although it used to tear up her stomach, denies diarrhea except rare, not eating well, sugars running mostly 100s and rare 200s, used to be 400s in March, denies numbness or tingling in hands or feet, denies vision blurring, poor appetite) and new problem of poor appetite (she has lost about 10 pounds in the last month, denies stomach pain except rare across the top of the stomach, she denies nausea or vomiting, denies diarrhea or constipation, denies significant bloating, denies food getting stuck in her throat, denies abnormal taste in her mouth, started about 2 months ago, she is not very hungry and when eating can only eat a few bites, is able to eat cherries right now, cannot force food, drinking only small amounts of liquids and cannot force liquids, takes only a sip with meds).   Review of Systems  Constitutional: Positive for appetite change, fatigue and unexpected weight change.       Night sweats  HENT: Negative.   Eyes: Negative.   Respiratory: Negative for cough, chest tightness and shortness of breath.   Cardiovascular: Negative for chest pain, palpitations and leg swelling.  Gastrointestinal: Positive for abdominal distention and abdominal pain. Negative for anal bleeding, blood in stool, constipation, diarrhea, nausea, rectal pain and vomiting.   Musculoskeletal: Negative.   Skin: Negative.   Neurological: Positive for headaches. Negative for dizziness, tremors, seizures, facial asymmetry, speech difficulty and weakness.  Psychiatric/Behavioral: Negative.     Objective:  Physical Exam Constitutional:      Appearance: She is well-developed. She is obese.  HENT:     Head: Normocephalic and atraumatic.  Neck:     Musculoskeletal: Normal range of motion.  Cardiovascular:     Rate and Rhythm: Normal rate and regular rhythm.  Pulmonary:     Effort: Pulmonary effort is normal. No respiratory distress.     Breath sounds: Normal breath sounds. No wheezing or rales.  Abdominal:     General: Bowel sounds are normal. There is no distension.     Palpations: Abdomen is soft.     Tenderness: There is no abdominal tenderness. There is no rebound.  Skin:    General: Skin is warm and dry.  Neurological:     Mental Status: She is alert and oriented to person, place, and time.     Coordination: Coordination normal.     Vitals:   07/27/18 0821  BP: 118/80  Pulse: 73  Temp: 98 F (36.7 C)  TempSrc: Oral  SpO2: 99%  Weight: 262 lb (118.8 kg)  Height: 5' (1.524 m)    Assessment & Plan:  Visit time 40 minutes: greater than 50% of that time was spent in face to face counseling and coordination of care with the patient: counseled about various new problems as well as isolation of the source of the problems as  she was a difficult historian

## 2018-07-27 NOTE — Assessment & Plan Note (Signed)
Rx for sumatriptan to try to relieve headache cycle. She is taking ibuprofen or tylenol for the pain. Could be related to relative dehydration with her uncontrolled diabetes. She does have morning headaches and asked again if she would undergo sleep test and she agrees so have ordered home sleep test also.

## 2018-07-27 NOTE — Assessment & Plan Note (Signed)
Could be related to uncontrolled diabetes (as timeline is similar) versus GERD. Checking CBB, CMP, lipase today. If no abnormality check US abdomen. Starting protonix to see if this improves symptoms. She is vague on symptoms and there are not typical bloating associated with gastroparesis, pain associated with PUD, change in taste or food getting stuck.

## 2018-07-29 ENCOUNTER — Encounter: Payer: Self-pay | Admitting: Internal Medicine

## 2018-08-10 ENCOUNTER — Other Ambulatory Visit: Payer: Self-pay

## 2018-08-10 ENCOUNTER — Encounter: Payer: Self-pay | Admitting: Family Medicine

## 2018-08-10 ENCOUNTER — Ambulatory Visit (INDEPENDENT_AMBULATORY_CARE_PROVIDER_SITE_OTHER): Payer: Managed Care, Other (non HMO) | Admitting: Family Medicine

## 2018-08-10 VITALS — BP 102/70 | HR 75 | Ht 60.0 in

## 2018-08-10 DIAGNOSIS — M170 Bilateral primary osteoarthritis of knee: Secondary | ICD-10-CM | POA: Diagnosis not present

## 2018-08-10 NOTE — Progress Notes (Signed)
[facility] [address] [place], [place] [address] Phone: [phone] Subjective:     CC: Bilateral knee pain  [id number]  Lynn Fields is a 48 y.o. female coming in with complaint of knee pain. Was given Monovisc bilaterally on 07/06/2018. Also having hand pain. Xray from last visit is (). Patient states that pain is constant in her knees. Had little relief from the injections. Pain has been 10/10 pain since last visit.  Patient is time.  Unfortunately affecting all daily activities.  Increasing instability.  Feels that surgical intervention may be necessary.       Past Medical History:  Diagnosis Date  . Arthritis    knees  . Depression   . Diabetes mellitus without complication ([place])   . Hypertension   . Migraines   . Ulcer    Past Surgical History:  Procedure Laterality Date  . CESAREAN SECTION  1993  . HYSTEROSCOPY WITH NOVASURE N/A 06/02/2016   Procedure: HYSTEROSCOPY WITH NOVASURE;  Surgeon: Paula Compton, MD;  Location: [facility];  Service: Gynecology;  Laterality: N/A;  . TUBAL LIGATION  1993   Social History   Socioeconomic History  . Marital status: Married    Spouse name: Not on file  . Number of children: Not on file  . Years of education: 62  . Highest education level: Not on file  Occupational History  . Occupation: Product manager: [employer]  . Financial resource strain: Not on file  . Food insecurity    Worry: Not on file    Inability: Not on file  . Transportation needs    Medical: Not on file    Non-medical: Not on file  Tobacco Use  . Smoking status: Never Smoker  . Smokeless tobacco: Never Used  Substance and Sexual Activity  . Alcohol use: Yes    Comment: occ  . Drug use: No  . Sexual activity: Yes    Birth control/protection: Surgical  Lifestyle  . Physical activity    Days per week: Not on file    Minutes per session: Not on file  . Stress: Not on file   Relationships  . Social Herbalist on phone: Not on file    Gets together: Not on file    Attends religious service: Not on file    Active member of club or organization: Not on file    Attends meetings of clubs or organizations: Not on file    Relationship status: Not on file  Other Topics Concern  . Not on file  Social History Narrative   Regular exercise-yes   Caffeine Use-no   Allergies  Allergen Reactions  . Other Anaphylaxis    Hair dye  . Shellfish Allergy Anaphylaxis    Closes throat  . Codeine Itching  . Menthol Hives  . Morphine And Related Itching  . Penicillins Swelling    Has patient had a PCN reaction causing immediate rash, facial/tongue/throat swelling, SOB or lightheadedness with hypotension: Yes Has patient had a PCN reaction causing severe rash involving mucus membranes or skin necrosis: No Has patient had a PCN reaction that required hospitalization No Has patient had a PCN reaction occurring within the last 10 years: No If all of the above answers are "NO", then may proceed with Cephalosporin use.  Ebbie [place] [Pseudoephedrine Hcl] Swelling  . Azithromycin Rash  . Erythromycin Rash   Family History  Problem Relation Age of  Onset  . Diabetes Mother   . Hypertension Mother   . Heart disease Mother   . Hyperlipidemia Mother   . Diabetes Father   . Hypertension Father   . Heart disease Father   . Hyperlipidemia Father   . Cervical cancer Sister   . Heart disease Sister   . Diabetes Sister   . Heart disease Sister   . Diabetes Sister     Current Outpatient Medications (Endocrine & Metabolic):  .  glipiZIDE (GLUCOTROL) 5 MG tablet, Take 1 tablet (5 mg total) by mouth daily before breakfast. .  metFORMIN (GLUCOPHAGE) 500 MG tablet, Take 1 tablet (500 mg total) by mouth 2 (two) times daily with a meal.  Current Outpatient Medications (Cardiovascular):  .  atorvastatin (LIPITOR) 40 MG tablet, TAKE 1 TABLET BY MOUTH ONCE DAILY AT  6  PM  (Patient taking differently: Take 40 mg by mouth daily at 6 PM. ) .  EPINEPHrine 0.3 mg/0.3 mL IJ SOAJ injection, Inject 0.3 mg into the muscle once as needed for anaphylaxis. .  hydrochlorothiazide (MICROZIDE) 12.5 MG capsule, Take 1 capsule (12.5 mg total) by mouth daily. Marland Kitchen  losartan (COZAAR) 50 MG tablet, Take 1 tablet (50 mg total) by mouth daily.  Current Outpatient Medications (Respiratory):  .  loratadine (CLARITIN) 10 MG tablet, Take 1 tablet (10 mg total) by mouth daily.  Current Outpatient Medications (Analgesics):  .  acetaminophen (TYLENOL) 500 MG tablet, Take 1,000 mg by mouth every 6 (six) hours as needed for headache. .  meloxicam (MOBIC) 15 MG tablet, Take 1 tablet (15 mg total) by mouth daily. .  SUMAtriptan (IMITREX) 50 MG tablet, Take 1 tablet (50 mg total) by mouth every 2 (two) hours as needed for migraine. May repeat in 2 hours if headache persists or recurs.   Current Outpatient Medications (Other):  Marland Kitchen  buPROPion (WELLBUTRIN XL) 300 MG 24 hr tablet, Take 300 mg by mouth daily. Marland Kitchen  CINNAMON PO, Take 1 tablet by mouth daily. .  hydrOXYzine (ATARAX/VISTARIL) 25 MG tablet, Take 1 tablet (25 mg total) by mouth every 6 (six) hours. .  ondansetron (ZOFRAN ODT) 4 MG disintegrating tablet, Take 1 tablet (4 mg total) by mouth every 8 (eight) hours as needed for nausea or vomiting. .  pantoprazole (PROTONIX) 40 MG tablet, Take 1 tablet (40 mg total) by mouth daily. .  TURMERIC PO, Take 1 tablet by mouth daily.    Past medical history, social, surgical and family history all reviewed in electronic medical record.  No pertanent information unless stated regarding to the chief complaint.   Review of Systems:  No headache, visual changes, nausea, vomiting, diarrhea, constipation, dizziness, abdominal pain, skin rash, fevers, chills, night sweats, weight loss, sw ollen lymph nodes, , chest pain, shortness of breath, mood changes.  Positive muscle aches and joint swelling  Objective   Blood pressure 102/70, pulse 75, height 5' (1.524 m), SpO2 98 %.    General: No apparent distress alert and oriented x3 mood and affect normal, dressed appropriately.  HEENT: Pupils equal, extraocular movements intact  Respiratory: Patient's speak in full sentences and does not appear short of breath  Cardiovascular: No lower extremity edema, non tender, no erythema  Skin: Warm dry intact with no signs of infection or rash on extremities or on axial skeleton.  Abdomen: Soft nontender  Neuro: Cranial nerves II through XII are intact, neurovascularly intact in all extremities with 2+ DTRs and 2+ pulses.  Lymph: No lymphadenopathy of posterior or anterior  cervical chain or axillae bilaterally.  Gait antalgic MSK:  tender with mild limited range of motion and good stability and symmetric strength and tone of shoulders, elbows, wrist, hip, and ankles bilaterally.  Knee: Bilateral valgus deformity noted. Large thigh to calf ratio.  Tender to palpation over medial and PF joint line.  ROM full in flexion and extension and lower leg rotation. instability with valgus force.  painful patellar compression. Patellar glide with moderate crepitus. Patellar and quadriceps tendons unremarkable. Hamstring and quadriceps strength is normal.  After informed written and verbal consent, patient was seated on exam table. Right knee was prepped with alcohol swab and utilizing anterolateral approach, patient's right knee space was injected with 4:1  marcaine 0.5%: Kenalog 40mg /dL. Patient tolerated the procedure well without immediate complications.  After informed written and verbal consent, patient was seated on exam table. Left knee was prepped with alcohol swab and utilizing anterolateral approach, patient's left knee space was injected with 4:1  marcaine 0.5%: Kenalog 40mg /dL. Patient tolerated the procedure well without immediate complications.    Impression and Recommendations:     This case required  medical decision making of moderate complexity. The above documentation has been reviewed and is accurate and complete Lyndal Pulley, DO       Note: This dictation was prepared with Dragon dictation along with smaller phrase technology. Any transcriptional errors that result from this process are unintentional.

## 2018-08-10 NOTE — Assessment & Plan Note (Signed)
Patient is failed all conservative therapy at this time.  failed Viscosupplementation. Repeat injection given today.  Discussed weight loss.  Will refer to orthopedic surgery to discuss the possibility of replacement.

## 2018-08-11 ENCOUNTER — Encounter: Payer: Self-pay | Admitting: Internal Medicine

## 2018-08-12 ENCOUNTER — Other Ambulatory Visit: Payer: Self-pay | Admitting: Internal Medicine

## 2018-08-12 ENCOUNTER — Encounter: Payer: Self-pay | Admitting: Family Medicine

## 2018-08-12 ENCOUNTER — Other Ambulatory Visit: Payer: Self-pay

## 2018-08-12 DIAGNOSIS — R630 Anorexia: Secondary | ICD-10-CM

## 2018-08-12 MED ORDER — ONDANSETRON 4 MG PO TBDP: 4.0000 mg | ORAL_TABLET | Freq: Three times a day (TID) | ORAL | 3 refills | Status: DC | PRN

## 2018-08-16 ENCOUNTER — Encounter: Payer: Self-pay | Admitting: Family Medicine

## 2018-08-16 ENCOUNTER — Encounter: Payer: Self-pay | Admitting: Orthopaedic Surgery

## 2018-08-16 ENCOUNTER — Ambulatory Visit (INDEPENDENT_AMBULATORY_CARE_PROVIDER_SITE_OTHER): Payer: Managed Care, Other (non HMO) | Admitting: Orthopaedic Surgery

## 2018-08-16 ENCOUNTER — Ambulatory Visit (INDEPENDENT_AMBULATORY_CARE_PROVIDER_SITE_OTHER): Payer: Managed Care, Other (non HMO)

## 2018-08-16 VITALS — Ht 60.25 in | Wt 262.0 lb

## 2018-08-16 DIAGNOSIS — M25561 Pain in right knee: Secondary | ICD-10-CM | POA: Diagnosis not present

## 2018-08-16 DIAGNOSIS — M170 Bilateral primary osteoarthritis of knee: Secondary | ICD-10-CM

## 2018-08-16 DIAGNOSIS — R638 Other symptoms and signs concerning food and fluid intake: Secondary | ICD-10-CM | POA: Diagnosis not present

## 2018-08-16 DIAGNOSIS — G8929 Other chronic pain: Secondary | ICD-10-CM

## 2018-08-16 NOTE — Progress Notes (Signed)
Office Visit Note   Patient: Lynn Fields           Date of Birth: 04/11/1970           MRN: [id number] Visit Date: 08/16/2018              Requested by: Lyndal Pulley, DO [place],  [place] [id number] PCP: Hoyt Koch, MD   Assessment & Plan: Visit Diagnoses:  1. Chronic pain of right knee   2. Primary osteoarthritis of both knees     Plan: I would like to refer her to Dr. Leafy Ro in the bariatric clinic for further action and treatment of her severe obesity.  I am not comfortable at this point proceeding with any type of knee replacement surgery due to her BMI of being over 50 and her hemoglobin A1c of 7.7.  I do feel that she is having a right direction in terms of her hemoglobin A1c.  Also her weight is 262 pounds and she says this is down from almost 380 at one point.  That is encouraging relates that she is heading in the right direction.  I would like to see her back in 3 months with a repeat weight and can see how she is doing overall.  All questions and concerns were addressed as appropriate as possible.  Follow-Up Instructions: Return in about 3 months (around 11/16/2018).   Orders:  Orders Placed This Encounter  Procedures   XR KNEE 3 VIEW RIGHT   No orders of the defined types were placed in this encounter.     Procedures: No procedures performed   Clinical Data: No additional findings.   Subjective: Chief Complaint  Patient presents with   Left Knee - Pain   Right Knee - Pain  The patient is a very pleasant 48 year old female who comes in with a chief complaint of bilateral knee pain and known severe arthritis in both her knees.  She is sent by Dr. Hulan Saas for further evaluation treatment of her knee arthritis and the potential for knee replacement surgery.  Her right knee hurts much worse than her left knee.  She has had multiple steroid injections and hyaluronic acid injections over the years.  Nothing helps at this  point.  She is a diabetic.  I was able to see that her hemoglobin A1c just 2 weeks ago was 7.7.  That was down from over 10 3 months ago.  Her BMI is unfortunately 50.74.  I did let her know that we are not allowed to perform knee replacement surgery on anyone with a BMI of over 39.5 due to the poor outcomes in knee replacement surgery and the morbidly obese population.  There is too much risk of implant failure and not putting the implants in a good position secondary to having to get around significant adipose tissue to get me in place.  Also with her diabetes she is at a high risk of infection.  Even in my own hands I let her know that I have seen my own patients that I push the limits on their weight with knee replacements have failures the required more significant surgeries and that I am not comfortable with surgery on her knee right now based on what I am seeing on her exam.  HPI  Review of Systems She currently denies any headache, chest pain, shortness of breath, fever, chills, nausea, vomiting  Objective: Vital Signs: Ht 5' 0.25" (1.53 m)  Wt 262 lb (118.8 kg)    BMI 50.74 kg/m   Physical Exam She is alert and orient x3 and in no acute distress Ortho Exam Examination of her right knee shows patellofemoral crepitation and significant medial joint line tenderness.  Her knee is ligamentously stable with good range of motion.  However she does have a large soft tissue envelope around the knee joint itself that is hard to mobilize to get to her knee.  She does have a very large thigh as well. Specialty Comments:  No specialty comments available.  Imaging: Xr Knee 3 View Right  Result Date: 08/16/2018 3 views of the right knee shows severe end-stage arthritis.  There is almost complete loss of the medial joint space.  There is significant disease of the patellofemoral joint in terms of arthritis.  There are osteophytes in all 3 compartments.  There is slight varus malalignment.    PMFS  History: Patient Active Problem List   Diagnosis Date Noted   Poor appetite 07/27/2018   Chronic headaches 07/27/2018   Right hand pain 07/06/2018   Hyperlipidemia associated with type 2 diabetes mellitus ([place]) 08/10/2017   Allergic reaction to food 02/04/2017   Adjustment disorder with mixed anxiety and depressed mood 08/07/2016   Degenerative arthritis of knee, bilateral 02/11/2016   Routine general medical examination at a health care facility 08/16/2015   Unspecified sleep apnea 06/02/2013   Essential hypertension 06/14/2012   Uncontrolled diabetes mellitus ([place]) 06/14/2012   Obesity, morbid ([place]) 06/14/2012   Past Medical History:  Diagnosis Date   Arthritis    knees   Depression    Diabetes mellitus without complication ([place])    Hypertension    Migraines    Ulcer     Family History  Problem Relation Age of Onset   Diabetes Mother    Hypertension Mother    Heart disease Mother    Hyperlipidemia Mother    Diabetes Father    Hypertension Father    Heart disease Father    Hyperlipidemia Father    Cervical cancer Sister    Heart disease Sister    Diabetes Sister    Heart disease Sister    Diabetes Sister     Past Surgical History:  Procedure Laterality Date   [place] N/A 06/02/2016   Procedure: HYSTEROSCOPY WITH NOVASURE;  Surgeon: Paula Compton, MD;  Location: [facility];  Service: Gynecology;  Laterality: N/A;   TUBAL LIGATION  1993   Social History   Occupational History   Occupation: Product manager: [employer]  Tobacco Use   Smoking status: Never Smoker   Smokeless tobacco: Never Used  Substance and Sexual Activity   Alcohol use: Yes    Comment: occ   Drug use: No   Sexual activity: Yes    Birth control/protection: Surgical

## 2018-08-16 NOTE — Addendum Note (Signed)
Addended by: Meyer Cory on: 08/16/2018 04:40 PM   Modules accepted: Orders

## 2018-08-18 ENCOUNTER — Encounter: Payer: Self-pay | Admitting: Internal Medicine

## 2018-08-18 MED ORDER — MELOXICAM 15 MG PO TABS: 15.0000 mg | ORAL_TABLET | Freq: Every day | ORAL | 0 refills | Status: DC

## 2018-08-23 ENCOUNTER — Ambulatory Visit (INDEPENDENT_AMBULATORY_CARE_PROVIDER_SITE_OTHER): Payer: Managed Care, Other (non HMO) | Admitting: Internal Medicine

## 2018-08-23 ENCOUNTER — Encounter: Payer: Self-pay | Admitting: Internal Medicine

## 2018-08-23 ENCOUNTER — Other Ambulatory Visit: Payer: Self-pay

## 2018-08-23 DIAGNOSIS — R630 Anorexia: Secondary | ICD-10-CM | POA: Diagnosis not present

## 2018-08-23 DIAGNOSIS — E1165 Type 2 diabetes mellitus with hyperglycemia: Secondary | ICD-10-CM | POA: Diagnosis not present

## 2018-08-23 DIAGNOSIS — R232 Flushing: Secondary | ICD-10-CM | POA: Diagnosis not present

## 2018-08-23 NOTE — Patient Instructions (Addendum)
We will have you stop taking the protonix pantoprazole.

## 2018-08-23 NOTE — Progress Notes (Signed)
   Subjective:   Patient ID: Lynn Fields, female    DOB: Sep 29, 1970, 48 y.o.   MRN: [id number]  HPI The patient is a 48 YO female coming in for right knee pain (needs knee replacement surgery but her surgeon will not do surgery unless she is able to have BMI <40, this is loss of 60 or so pounds more, she is working on this and is down about 15 since start of year, she has been referred to weight loss clinic and has not heard from them yet, she is willing to do this and wants to get knee surgery done as this is causing her significant pain) and lack of appetite (tried protonix for 3 weeks or so now, has not noticed any difference with this, she is still having poor appetite, sugars are running a lot more normal, forcing herself to eat but has not eaten anything yet today, just drank a little liquids to take her medicines, denies nausea or vomiting, denies stomach pains, denies that eating makes her stomach hurt, denies diarrhea, some constipation which she thinks is from not eating much, denies blood in stool) and hot flashes (she is seeing ob/gyn in a couple months, having mood changes and hot flashes, this is affecting her QOL, denies trying anything for it, denies fevers or chills, does have daily or multiple times per day, family is noticing changes).   Review of Systems  Constitutional: Positive for appetite change and unexpected weight change.  HENT: Negative.   Eyes: Negative.   Respiratory: Negative for cough, chest tightness and shortness of breath.   Cardiovascular: Negative for chest pain, palpitations and leg swelling.  Gastrointestinal: Positive for abdominal distention. Negative for abdominal pain, constipation, diarrhea, nausea and vomiting.  Endocrine: Positive for heat intolerance.  Musculoskeletal: Positive for arthralgias. Negative for gait problem.  Skin: Negative.   Neurological: Negative.   Psychiatric/Behavioral: Positive for decreased concentration and dysphoric mood.     Objective:  Physical Exam Constitutional:      Appearance: She is well-developed. She is obese.  HENT:     Head: Normocephalic and atraumatic.  Neck:     Musculoskeletal: Normal range of motion.  Cardiovascular:     Rate and Rhythm: Normal rate and regular rhythm.  Pulmonary:     Effort: Pulmonary effort is normal. No respiratory distress.     Breath sounds: Normal breath sounds. No wheezing or rales.  Abdominal:     General: Bowel sounds are normal. There is no distension.     Palpations: Abdomen is soft.     Tenderness: There is no abdominal tenderness. There is no guarding or rebound.  Musculoskeletal:        General: Tenderness present.  Skin:    General: Skin is warm and dry.  Neurological:     Mental Status: She is alert and oriented to person, place, and time.     Coordination: Coordination normal.     Vitals:   08/23/18 1436  BP: 118/80  Pulse: 76  Temp: 98 F (36.7 C)  TempSrc: Oral  SpO2: 99%  Weight: 264 lb (119.7 kg)  Height: 5' 0.25" (1.53 m)    Assessment & Plan:  Visit time 40 minutes: greater than 50% of that time was spent in face to face counseling and coordination of care with the patient: counseled about medical problems and weight loss and coordination of care

## 2018-08-24 DIAGNOSIS — R232 Flushing: Secondary | ICD-10-CM | POA: Insufficient documentation

## 2018-08-24 NOTE — Assessment & Plan Note (Signed)
HgA1c ordered. She is much closer to goal 7.5 currently. She is advised to avoid any steroid injections or courses as she reacts very strongly with these. Taking glipizide and metformin.

## 2018-08-24 NOTE — Assessment & Plan Note (Signed)
She is still having poor appetite and getting US abdomen next week. She has not had any benefit from adding protonix so this is discontinued at this time.

## 2018-08-24 NOTE — Assessment & Plan Note (Signed)
Advised that she may be a good candidate for low dose hormonal therapy to help with her significant mood swings and hot flashes. She wishes to further discuss with her ob/gyn at her upcoming apt.

## 2018-08-24 NOTE — Assessment & Plan Note (Signed)
She is working on weight loss and currently is losing weight. She is hoping to lose 60 pounds by October which is not overly realistic and discussed this with her. She is wanting to do the medical weight loss clinic to try at least and they should contact her.

## 2018-08-31 ENCOUNTER — Other Ambulatory Visit: Payer: Self-pay | Admitting: Internal Medicine

## 2018-08-31 ENCOUNTER — Encounter: Payer: Self-pay | Admitting: Internal Medicine

## 2018-08-31 ENCOUNTER — Ambulatory Visit
Admission: RE | Admit: 2018-08-31 | Discharge: 2018-08-31 | Disposition: A | Discharge status: Home / Self Care | Payer: Managed Care, Other (non HMO) | Source: Ambulatory Visit | Attending: Internal Medicine | Admitting: Internal Medicine

## 2018-08-31 DIAGNOSIS — K769 Liver disease, unspecified: Secondary | ICD-10-CM

## 2018-08-31 DIAGNOSIS — R630 Anorexia: Secondary | ICD-10-CM

## 2018-09-02 ENCOUNTER — Telehealth: Payer: Self-pay | Admitting: Emergency Medicine

## 2018-09-02 NOTE — Telephone Encounter (Signed)
Pt would like a call back in regard

## 2018-09-02 NOTE — Telephone Encounter (Signed)
Copied from [facility] [phone]. Topic: General - Other >> Sep 02, 2018 12:36 PM Rayann Heman wrote: Reason for CRM: Angie calling from [facility] department states that an order for liver MRI was missing a DOB and Physicians signature. She would like this faxed to [phone]

## 2018-09-06 ENCOUNTER — Encounter: Payer: Self-pay | Admitting: Internal Medicine

## 2018-09-06 NOTE — Telephone Encounter (Signed)
Re-sent with information requested below

## 2018-09-06 NOTE — Telephone Encounter (Signed)
pls use previous instruction but the caller is from [facility] not imaging  Fax to [phone] did not receive on !st request

## 2018-09-07 ENCOUNTER — Encounter: Payer: Self-pay | Admitting: Family Medicine

## 2018-09-07 ENCOUNTER — Encounter: Payer: Self-pay | Admitting: Internal Medicine

## 2018-09-07 DIAGNOSIS — K769 Liver disease, unspecified: Secondary | ICD-10-CM

## 2018-09-08 NOTE — Telephone Encounter (Signed)
Received call from [facility] and they are needing updated labs for BUN and Creatine.   They can do the labs at the time of her appt.   Can you please fax lab orders to them to [phone]

## 2018-09-13 ENCOUNTER — Encounter: Payer: Self-pay | Admitting: Internal Medicine

## 2018-09-13 LAB — BASIC METABOLIC PANEL
BUN: 9 (ref 4–21)
Glucose: 106
Potassium: 3.4 (ref 3.4–5.3)
Sodium: 136 — AB (ref 137–147)

## 2018-09-15 ENCOUNTER — Encounter: Payer: Self-pay | Admitting: Internal Medicine

## 2018-09-15 ENCOUNTER — Other Ambulatory Visit: Payer: Self-pay

## 2018-09-15 DIAGNOSIS — G44221 Chronic tension-type headache, intractable: Secondary | ICD-10-CM

## 2018-09-21 ENCOUNTER — Encounter: Payer: Self-pay | Admitting: Internal Medicine

## 2018-09-21 ENCOUNTER — Other Ambulatory Visit: Payer: Self-pay

## 2018-09-21 ENCOUNTER — Ambulatory Visit (INDEPENDENT_AMBULATORY_CARE_PROVIDER_SITE_OTHER): Payer: Managed Care, Other (non HMO) | Admitting: Internal Medicine

## 2018-09-21 VITALS — BP 120/82 | HR 87 | Temp 98.2°F | Ht 60.25 in | Wt 265.0 lb

## 2018-09-21 DIAGNOSIS — E785 Hyperlipidemia, unspecified: Secondary | ICD-10-CM

## 2018-09-21 DIAGNOSIS — Z23 Encounter for immunization: Secondary | ICD-10-CM | POA: Diagnosis not present

## 2018-09-21 DIAGNOSIS — I10 Essential (primary) hypertension: Secondary | ICD-10-CM | POA: Diagnosis not present

## 2018-09-21 DIAGNOSIS — R630 Anorexia: Secondary | ICD-10-CM

## 2018-09-21 DIAGNOSIS — E1169 Type 2 diabetes mellitus with other specified complication: Secondary | ICD-10-CM

## 2018-09-21 MED ORDER — ATORVASTATIN CALCIUM 40 MG PO TABS: ORAL_TABLET | ORAL | 3 refills | Status: DC

## 2018-09-21 MED ORDER — BUPROPION HCL ER (XL) 300 MG PO TB24: 300.0000 mg | ORAL_TABLET | Freq: Every day | ORAL | 3 refills | Status: DC

## 2018-09-21 MED ORDER — LOSARTAN POTASSIUM 50 MG PO TABS: 50.0000 mg | ORAL_TABLET | Freq: Every day | ORAL | 3 refills | Status: DC

## 2018-09-21 MED ORDER — MONTELUKAST SODIUM 10 MG PO TABS: 10.0000 mg | ORAL_TABLET | Freq: Every day | ORAL | 3 refills | Status: DC

## 2018-09-21 MED ORDER — METFORMIN HCL 500 MG PO TABS: 500.0000 mg | ORAL_TABLET | Freq: Two times a day (BID) | ORAL | 3 refills | Status: DC

## 2018-09-21 MED ORDER — HYDROCHLOROTHIAZIDE 12.5 MG PO CAPS: 12.5000 mg | ORAL_CAPSULE | Freq: Every day | ORAL | 3 refills | Status: DC

## 2018-09-21 NOTE — Progress Notes (Signed)
   Subjective:   Patient ID: Lynn Fields, female    DOB: 11/07/1970, 48 y.o.   MRN: [id number]  HPI The patient is a 48 YO female coming in for follow up of MRI results. This was done for lesion in the liver on US imaging. The results were not faxed to [place] so she brought [place] a copy. She is still not having good appetite although weight is stable. Denies abnormal taste in mouth.   Review of Systems  Constitutional: Positive for appetite change.  HENT: Negative.   Eyes: Negative.   Respiratory: Negative for cough, chest tightness and shortness of breath.   Cardiovascular: Negative for chest pain, palpitations and leg swelling.  Gastrointestinal: Negative for abdominal distention, abdominal pain, constipation, diarrhea, nausea and vomiting.  Musculoskeletal: Negative.   Skin: Negative.   Neurological: Negative.   Psychiatric/Behavioral: Negative.     Objective:  Physical Exam Constitutional:      Appearance: She is well-developed. She is obese.  HENT:     Head: Normocephalic and atraumatic.  Neck:     Musculoskeletal: Normal range of motion.  Cardiovascular:     Rate and Rhythm: Normal rate and regular rhythm.  Pulmonary:     Effort: Pulmonary effort is normal. No respiratory distress.     Breath sounds: Normal breath sounds. No wheezing or rales.  Abdominal:     General: Bowel sounds are normal. There is no distension.     Palpations: Abdomen is soft.     Tenderness: There is no abdominal tenderness. There is no rebound.  Skin:    General: Skin is warm and dry.  Neurological:     Mental Status: She is alert and oriented to person, place, and time.     Coordination: Coordination normal.     Vitals:   09/21/18 1434  BP: 120/82  Pulse: 87  Temp: 98.2 F (36.8 C)  TempSrc: Oral  SpO2: 99%  Weight: 265 lb (120.2 kg)  Height: 5' 0.25" (1.53 m)   Flu shot given at visit  Assessment & Plan:  Visit time 25 minutes: greater than 50% of that time was spent in face to face  counseling and coordination of care with the patient: counseled about imaging results as well as potential future imaging needs based on findings as well as reassurance with normal results

## 2018-09-21 NOTE — Patient Instructions (Addendum)
Health Maintenance, Female Adopting a healthy lifestyle and getting preventive care are important in promoting health and wellness. Ask your health care provider about:  The right schedule for you to have regular tests and exams.  Things you can do on your own to prevent diseases and keep yourself healthy. What should I know about diet, weight, and exercise? Eat a healthy diet   Eat a diet that includes plenty of vegetables, fruits, low-fat dairy products, and lean protein.  Do not eat a lot of foods that are high in solid fats, added sugars, or sodium. Maintain a healthy weight Body mass index (BMI) is used to identify weight problems. It estimates body fat based on height and weight. Your health care provider can help determine your BMI and help you achieve or maintain a healthy weight. Get regular exercise Get regular exercise. This is one of the most important things you can do for your health. Most adults should:  Exercise for at least 150 minutes each week. The exercise should increase your heart rate and make you sweat (moderate-intensity exercise).  Do strengthening exercises at least twice a week. This is in addition to the moderate-intensity exercise.  Spend less time sitting. Even light physical activity can be beneficial. Watch cholesterol and blood lipids Have your blood tested for lipids and cholesterol at 46 years of age, then have this test every 5 years. Have your cholesterol levels checked more often if:  Your lipid or cholesterol levels are high.  You are older than 46 years of age.  You are at high risk for heart disease. What should I know about cancer screening? Depending on your health history and family history, you may need to have cancer screening at various ages. This may include screening for:  Breast cancer.  Cervical cancer.  Colorectal cancer.  Skin cancer.  Lung cancer. What should I know about heart disease, diabetes, and high blood  pressure? Blood pressure and heart disease  High blood pressure causes heart disease and increases the risk of stroke. This is more likely to develop in people who have high blood pressure readings, are of African descent, or are overweight.  Have your blood pressure checked: ? Every 3-5 years if you are 46-39 years of age. ? Every year if you are 40 years old or older. Diabetes Have regular diabetes screenings. This checks your fasting blood sugar level. Have the screening done:  Once every three years after age 46 if you are at a normal weight and have a low risk for diabetes.  More often and at a younger age if you are overweight or have a high risk for diabetes. What should I know about preventing infection? Hepatitis B If you have a higher risk for hepatitis B, you should be screened for this virus. Talk with your health care provider to find out if you are at risk for hepatitis B infection. Hepatitis C Testing is recommended for:  Everyone born from 1945 through 1965.  Anyone with known risk factors for hepatitis C. Sexually transmitted infections (STIs)  Get screened for STIs, including gonorrhea and chlamydia, if: ? You are sexually active and are younger than 46 years of age. ? You are older than 46 years of age and your health care provider tells you that you are at risk for this type of infection. ? Your sexual activity has changed since you were last screened, and you are at increased risk for chlamydia or gonorrhea. Ask your health care provider if   you are at risk.  Ask your health care provider about whether you are at high risk for HIV. Your health care provider may recommend a prescription medicine to help prevent HIV infection. If you choose to take medicine to prevent HIV, you should first get tested for HIV. You should then be tested every 3 months for as long as you are taking the medicine. Pregnancy  If you are about to stop having your period (premenopausal) and  you may become pregnant, seek counseling before you get pregnant.  Take 400 to 800 micrograms (mcg) of folic acid every day if you become pregnant.  Ask for birth control (contraception) if you want to prevent pregnancy. Osteoporosis and menopause Osteoporosis is a disease in which the bones lose minerals and strength with aging. This can result in bone fractures. If you are 46 years old or older, or if you are at risk for osteoporosis and fractures, ask your health care provider if you should:  Be screened for bone loss.  Take a calcium or vitamin D supplement to lower your risk of fractures.  Be given hormone replacement therapy (HRT) to treat symptoms of menopause. Follow these instructions at home: Lifestyle  Do not use any products that contain nicotine or tobacco, such as cigarettes, e-cigarettes, and chewing tobacco. If you need help quitting, ask your health care provider.  Do not use street drugs.  Do not share needles.  Ask your health care provider for help if you need support or information about quitting drugs. Alcohol use  Do not drink alcohol if: ? Your health care provider tells you not to drink. ? You are pregnant, may be pregnant, or are planning to become pregnant.  If you drink alcohol: ? Limit how much you use to 0-1 drink a day. ? Limit intake if you are breastfeeding.  Be aware of how much alcohol is in your drink. In the [place] one drink equals one 12 oz bottle of beer (355 mL), one 5 oz glass of wine (148 mL), or one 1 oz glass of hard liquor (44 mL). General instructions  Schedule regular health, dental, and eye exams.  Stay current with your vaccines.  Tell your health care provider if: ? You often feel depressed. ? You have ever been abused or do not feel safe at home. Summary  Adopting a healthy lifestyle and getting preventive care are important in promoting health and wellness.  Follow your health care provider's instructions about healthy  diet, exercising, and getting tested or screened for diseases.  Follow your health care provider's instructions on monitoring your cholesterol and blood pressure. This information is not intended to replace advice given to you by your health care provider. Make sure you discuss any questions you have with your health care provider. Document Released: 07/29/2010 Document Revised: 01/06/2018 Document Reviewed: 01/06/2018 Elsevier Patient Education  2020 [employer] Inc.  

## 2018-09-24 NOTE — Assessment & Plan Note (Signed)
MRI abdomen reviewed with patient and husband (on phone) today without suspicious findings. Liver lesion is hemangioma and likely fatty tissue. Will get follow up imaging in 6 months. There was no malignancy found to explain poor appetite. She has been glucose control currently as this was elevated when this started.

## 2018-09-28 ENCOUNTER — Institutional Professional Consult (permissible substitution): Payer: Self-pay | Admitting: Neurology

## 2018-09-29 ENCOUNTER — Other Ambulatory Visit: Payer: Managed Care, Other (non HMO)

## 2018-09-30 ENCOUNTER — Encounter: Payer: Self-pay | Admitting: Internal Medicine

## 2018-10-01 ENCOUNTER — Telehealth: Payer: Self-pay | Admitting: Family

## 2018-10-01 ENCOUNTER — Telehealth: Payer: Self-pay | Admitting: Internal Medicine

## 2018-10-01 NOTE — Telephone Encounter (Signed)
Patient is requesting to transfer from [facility] to [facility] and for [facility] to complete a CPE.  Please advise.

## 2018-10-01 NOTE — Telephone Encounter (Signed)
I would prefer not to make this transfer. Thanks-

## 2018-10-01 NOTE — Telephone Encounter (Signed)
Sheena, Do you think one of your female MD's would be able to take Mrs. Dennehy on as a patient?

## 2018-10-05 ENCOUNTER — Ambulatory Visit (INDEPENDENT_AMBULATORY_CARE_PROVIDER_SITE_OTHER): Payer: Managed Care, Other (non HMO) | Admitting: Internal Medicine

## 2018-10-05 ENCOUNTER — Other Ambulatory Visit: Payer: Self-pay

## 2018-10-05 DIAGNOSIS — F4323 Adjustment disorder with mixed anxiety and depressed mood: Secondary | ICD-10-CM

## 2018-10-05 DIAGNOSIS — E1169 Type 2 diabetes mellitus with other specified complication: Secondary | ICD-10-CM

## 2018-10-05 DIAGNOSIS — E785 Hyperlipidemia, unspecified: Secondary | ICD-10-CM

## 2018-10-05 DIAGNOSIS — E1165 Type 2 diabetes mellitus with hyperglycemia: Secondary | ICD-10-CM | POA: Diagnosis not present

## 2018-10-05 DIAGNOSIS — I10 Essential (primary) hypertension: Secondary | ICD-10-CM

## 2018-10-05 DIAGNOSIS — K760 Fatty (change of) liver, not elsewhere classified: Secondary | ICD-10-CM

## 2018-10-05 NOTE — Telephone Encounter (Signed)
Pt has been called and scheduled today for a virtual

## 2018-10-05 NOTE — Telephone Encounter (Signed)
Yes, probably no CPE on first visit tho as it looks like she has a lot of issues.

## 2018-10-05 NOTE — Progress Notes (Signed)
Virtual Visit via Video Note  I connected with Lynn Fields on 10/05/18 at  2:30 PM EDT by a video enabled telemedicine application and verified that I am speaking with the correct person using two identifiers.  Location patient: home Location provider: work office Persons participating in the virtual visit: patient, provider  I discussed the limitations of evaluation and management by telemedicine and the availability of in person appointments. The patient expressed understanding and agreed to proceed.   HPI: She has scheduled this visit to establish care and discuss chronic medical conditions.  Her past medical history is significant for:  1.  Morbid obesity, she is working on losing weight as she was told she needed to in order to have her right knee replaced.  2.  Bilateral knee osteoarthritis followed by orthopedics.  3.  Well-controlled hypertension  4.  Type 2 diabetes that has been well controlled  5.  Hyperlipidemia on statin  6.  Adjustment disorder/depression for which she is currently seeing a counselor.  7.  During a work-up for decreased appetite she was found on ultrasound to have not only fatty liver but also 2 liver lesions that were studied further with an MRI and found to be hemangiomas and no follow-up was recommended.  She is a never smoker, drinks alcohol occasionally.  Multiple family members with diabetes and hypertension.  She wants to schedule follow-up for her annual physical.   ROS: Constitutional: Denies fever, chills, diaphoresis, appetite change and fatigue.  HEENT: Denies photophobia, eye pain, redness, hearing loss, ear pain, congestion, sore throat, rhinorrhea, sneezing, mouth sores, trouble swallowing, neck pain, neck stiffness and tinnitus.   Respiratory: Denies SOB, DOE, cough, chest tightness,  and wheezing.   Cardiovascular: Denies chest pain, palpitations and leg swelling.  Gastrointestinal: Denies nausea, vomiting, abdominal pain,  diarrhea, constipation, blood in stool and abdominal distention.  Genitourinary: Denies dysuria, urgency, frequency, hematuria, flank pain and difficulty urinating.  Endocrine: Denies: hot or cold intolerance, sweats, changes in hair or nails, polyuria, polydipsia. Musculoskeletal: Denies myalgias, back pain, joint swelling, arthralgias and gait problem.  Skin: Denies pallor, rash and wound.  Neurological: Denies dizziness, seizures, syncope, weakness, light-headedness, numbness and headaches.  Hematological: Denies adenopathy. Easy bruising, personal or family bleeding history  Psychiatric/Behavioral: Denies suicidal ideation, mood changes, confusion, nervousness, sleep disturbance and agitation   Past Medical History:  Diagnosis Date  . Arthritis    knees  . Depression   . Diabetes mellitus without complication ([place])   . Hypertension   . Migraines   . Ulcer     Past Surgical History:  Procedure Laterality Date  . CESAREAN SECTION  1993  . HYSTEROSCOPY WITH NOVASURE N/A 06/02/2016   Procedure: HYSTEROSCOPY WITH NOVASURE;  Surgeon: Paula Compton, MD;  Location: [facility];  Service: Gynecology;  Laterality: N/A;  . TUBAL LIGATION  1993    Family History  Problem Relation Age of Onset  . Diabetes Mother   . Hypertension Mother   . Heart disease Mother   . Hyperlipidemia Mother   . Diabetes Father   . Hypertension Father   . Heart disease Father   . Hyperlipidemia Father   . Cervical cancer Sister   . Heart disease Sister   . Diabetes Sister   . Heart disease Sister   . Diabetes Sister     SOCIAL HX:   reports that she has never smoked. She has never used smokeless tobacco. She reports current alcohol use. She reports that she does  not use drugs.   Current Outpatient Medications:  .  acetaminophen (TYLENOL) 500 MG tablet, Take 1,000 mg by mouth every 6 (six) hours as needed for headache., Disp: , Rfl:  .  atorvastatin (LIPITOR) 40 MG tablet, TAKE 1 TABLET BY MOUTH ONCE  DAILY AT  6  PM, Disp: 90 tablet, Rfl: 3 .  buPROPion (WELLBUTRIN XL) 300 MG 24 hr tablet, Take 1 tablet (300 mg total) by mouth daily., Disp: 90 tablet, Rfl: 3 .  CINNAMON PO, Take 1 tablet by mouth daily., Disp: , Rfl:  .  EPINEPHrine 0.3 mg/0.3 mL IJ SOAJ injection, Inject 0.3 mg into the muscle once as needed for anaphylaxis., Disp: , Rfl:  .  glipiZIDE (GLUCOTROL) 5 MG tablet, Take 1 tablet (5 mg total) by mouth daily before breakfast., Disp: 30 tablet, Rfl: 3 .  hydrochlorothiazide (MICROZIDE) 12.5 MG capsule, Take 1 capsule (12.5 mg total) by mouth daily., Disp: 90 capsule, Rfl: 3 .  hydrOXYzine (ATARAX/VISTARIL) 25 MG tablet, Take 1 tablet (25 mg total) by mouth every 6 (six) hours., Disp: 20 tablet, Rfl: 0 .  loratadine (CLARITIN) 10 MG tablet, Take 1 tablet (10 mg total) by mouth daily., Disp: 14 tablet, Rfl: 0 .  losartan (COZAAR) 50 MG tablet, Take 1 tablet (50 mg total) by mouth daily., Disp: 90 tablet, Rfl: 3 .  meloxicam (MOBIC) 15 MG tablet, Take 1 tablet (15 mg total) by mouth daily., Disp: 30 tablet, Rfl: 0 .  metFORMIN (GLUCOPHAGE) 500 MG tablet, Take 1 tablet (500 mg total) by mouth 2 (two) times daily with a meal., Disp: 180 tablet, Rfl: 3 .  montelukast (SINGULAIR) 10 MG tablet, Take 1 tablet (10 mg total) by mouth at bedtime., Disp: 90 tablet, Rfl: 3 .  ondansetron (ZOFRAN ODT) 4 MG disintegrating tablet, Take 1 tablet (4 mg total) by mouth every 8 (eight) hours as needed for nausea or vomiting., Disp: 35 tablet, Rfl: 3 .  SUMAtriptan (IMITREX) 50 MG tablet, Take 1 tablet (50 mg total) by mouth every 2 (two) hours as needed for migraine. May repeat in 2 hours if headache persists or recurs., Disp: 10 tablet, Rfl: 0  EXAM:   VITALS per patient if applicable: None reported  GENERAL: alert, oriented, appears well and in no acute distress  HEENT: atraumatic, conjunttiva clear, no obvious abnormalities on inspection of external nose and ears  NECK: normal movements of the  head and neck  LUNGS: on inspection no signs of respiratory distress, breathing rate appears normal, no obvious gross increased work of breathing, gasping or wheezing  CV: no obvious cyanosis  MS: moves all visible extremities without noticeable abnormality  PSYCH/NEURO: pleasant and cooperative, no obvious depression or anxiety, speech and thought processing grossly intact  ASSESSMENT AND PLAN:   Essential hypertension -Has been well controlled in the past, no recent measurements to share.  Hyperlipidemia associated with type 2 diabetes mellitus ([place]) -Last LDL was 91 in April 2019, not at goal for diabetic which should be less than 70. -Follow-up lipids at next visit.  Uncontrolled type 2 diabetes mellitus with hyperglycemia ([facility]) -Last A1c was 7.7 in June 20, not at goal. -Check A1c at next in office visit. -We have discussed diet modification today.  Obesity, morbid ([place]) -Discussed healthy lifestyle, including increased physical activity and better food choices to promote weight loss. -She would be a good candidate for referral to healthy weight and wellness or bariatric clinic, will discuss at next follow-up.  Adjustment disorder with mixed anxiety  and depressed mood -Seeing a Social worker.  Fatty liver -Check LFTs next visit.    I discussed the assessment and treatment plan with the patient. The patient was provided an opportunity to ask questions and all were answered. The patient agreed with the plan and demonstrated an understanding of the instructions.   The patient was advised to call back or seek an in-person evaluation if the symptoms worsen or if the condition fails to improve as anticipated.    Lelon Frohlich, MD  [facility] at [facility]

## 2018-10-05 NOTE — Telephone Encounter (Signed)
Dr. Jerilee Hoh - Please advise if ok for pt to transfer care. Thanks!

## 2018-10-21 LAB — HM MAMMOGRAPHY: HM Mammogram: NORMAL (ref 0–4)

## 2018-10-26 ENCOUNTER — Ambulatory Visit (INDEPENDENT_AMBULATORY_CARE_PROVIDER_SITE_OTHER): Payer: Managed Care, Other (non HMO) | Admitting: Internal Medicine

## 2018-10-26 ENCOUNTER — Other Ambulatory Visit: Payer: Self-pay

## 2018-10-26 ENCOUNTER — Other Ambulatory Visit: Payer: Self-pay | Admitting: Internal Medicine

## 2018-10-26 ENCOUNTER — Encounter: Payer: Self-pay | Admitting: Internal Medicine

## 2018-10-26 VITALS — BP 98/58 | HR 92 | Temp 98.2°F | Ht 60.5 in | Wt 254.1 lb

## 2018-10-26 DIAGNOSIS — K760 Fatty (change of) liver, not elsewhere classified: Secondary | ICD-10-CM

## 2018-10-26 DIAGNOSIS — E785 Hyperlipidemia, unspecified: Secondary | ICD-10-CM

## 2018-10-26 DIAGNOSIS — E559 Vitamin D deficiency, unspecified: Secondary | ICD-10-CM | POA: Insufficient documentation

## 2018-10-26 DIAGNOSIS — E1165 Type 2 diabetes mellitus with hyperglycemia: Secondary | ICD-10-CM

## 2018-10-26 DIAGNOSIS — J302 Other seasonal allergic rhinitis: Secondary | ICD-10-CM

## 2018-10-26 DIAGNOSIS — Z0000 Encounter for general adult medical examination without abnormal findings: Secondary | ICD-10-CM | POA: Diagnosis not present

## 2018-10-26 DIAGNOSIS — F4323 Adjustment disorder with mixed anxiety and depressed mood: Secondary | ICD-10-CM

## 2018-10-26 DIAGNOSIS — I10 Essential (primary) hypertension: Secondary | ICD-10-CM | POA: Diagnosis not present

## 2018-10-26 DIAGNOSIS — E1169 Type 2 diabetes mellitus with other specified complication: Secondary | ICD-10-CM

## 2018-10-26 LAB — CBC WITH DIFFERENTIAL/PLATELET
Basophils Absolute: 0 10*3/uL (ref 0.0–0.1)
Basophils Relative: 0.5 % (ref 0.0–3.0)
Eosinophils Absolute: 0.1 10*3/uL (ref 0.0–0.7)
Eosinophils Relative: 1.3 % (ref 0.0–5.0)
HCT: 41.8 % (ref 36.0–46.0)
Hemoglobin: 14 g/dL (ref 12.0–15.0)
Lymphocytes Relative: 27.4 % (ref 12.0–46.0)
Lymphs Abs: 2 10*3/uL (ref 0.7–4.0)
MCHC: 33.5 g/dL (ref 30.0–36.0)
MCV: 85.6 fl (ref 78.0–100.0)
Monocytes Absolute: 0.4 10*3/uL (ref 0.1–1.0)
Monocytes Relative: 5.9 % (ref 3.0–12.0)
Neutro Abs: 4.8 10*3/uL (ref 1.4–7.7)
Neutrophils Relative %: 64.9 % (ref 43.0–77.0)
Platelets: 306 10*3/uL (ref 150.0–400.0)
RBC: 4.89 Mil/uL (ref 3.87–5.11)
RDW: 13.4 % (ref 11.5–15.5)
WBC: 7.4 10*3/uL (ref 4.0–10.5)

## 2018-10-26 LAB — HEMOGLOBIN A1C: Hgb A1c MFr Bld: 7 % — ABNORMAL HIGH (ref 4.6–6.5)

## 2018-10-26 LAB — COMPREHENSIVE METABOLIC PANEL
ALT: 14 U/L (ref 0–35)
AST: 12 U/L (ref 0–37)
Albumin: 4.4 g/dL (ref 3.5–5.2)
Alkaline Phosphatase: 96 U/L (ref 39–117)
BUN: 10 mg/dL (ref 6–23)
CO2: 24 mEq/L (ref 19–32)
Calcium: 9.9 mg/dL (ref 8.4–10.5)
Chloride: 101 mEq/L (ref 96–112)
Creatinine, Ser: 0.84 mg/dL (ref 0.40–1.20)
GFR: 88.32 mL/min (ref >60.00)
Glucose, Bld: 134 mg/dL — ABNORMAL HIGH (ref 70–99)
Potassium: 4.6 mEq/L (ref 3.5–5.1)
Sodium: 137 mEq/L (ref 135–145)
Total Bilirubin: 0.9 mg/dL (ref 0.2–1.2)
Total Protein: 7.5 g/dL (ref 6.0–8.3)

## 2018-10-26 LAB — LIPID PANEL
Cholesterol: 165 mg/dL (ref 0–200)
HDL: 58.7 mg/dL (ref >39.00)
LDL Cholesterol: 88 mg/dL (ref 0–99)
NonHDL: 106.62
Total CHOL/HDL Ratio: 3
Triglycerides: 92 mg/dL (ref 0.0–149.0)
VLDL: 18.4 mg/dL (ref 0.0–40.0)

## 2018-10-26 LAB — TSH: TSH: 0.79 u[IU]/mL (ref 0.35–4.50)

## 2018-10-26 LAB — VITAMIN D 25 HYDROXY (VIT D DEFICIENCY, FRACTURES): VITD: 21.21 ng/mL — ABNORMAL LOW (ref 30.00–100.00)

## 2018-10-26 LAB — VITAMIN B12: Vitamin B-12: 514 pg/mL (ref 211–911)

## 2018-10-26 MED ORDER — MONTELUKAST SODIUM 10 MG PO TABS: 10.0000 mg | ORAL_TABLET | Freq: Every day | ORAL | 1 refills | Status: DC

## 2018-10-26 MED ORDER — VITAMIN D (ERGOCALCIFEROL) 1.25 MG (50000 UNIT) PO CAPS: 50000.0000 [IU] | ORAL_CAPSULE | ORAL | 0 refills | Status: AC

## 2018-10-26 MED ORDER — SUMATRIPTAN SUCCINATE 50 MG PO TABS: 50.0000 mg | ORAL_TABLET | ORAL | 0 refills | Status: DC | PRN

## 2018-10-26 MED ORDER — MELOXICAM 15 MG PO TABS: 15.0000 mg | ORAL_TABLET | Freq: Every day | ORAL | 0 refills | Status: DC

## 2018-10-26 MED ORDER — ONDANSETRON 4 MG PO TBDP: 4.0000 mg | ORAL_TABLET | Freq: Three times a day (TID) | ORAL | 0 refills | Status: DC | PRN

## 2018-10-26 MED ORDER — ATORVASTATIN CALCIUM 40 MG PO TABS: ORAL_TABLET | ORAL | 1 refills | Status: DC

## 2018-10-26 MED ORDER — LOSARTAN POTASSIUM 50 MG PO TABS: 50.0000 mg | ORAL_TABLET | Freq: Every day | ORAL | 1 refills | Status: DC

## 2018-10-26 MED ORDER — EPINEPHRINE 0.15 MG/0.3ML IJ SOAJ: 0.3000 mg | Freq: Once | INTRAMUSCULAR | 0 refills | Status: AC | PRN

## 2018-10-26 MED ORDER — METFORMIN HCL 500 MG PO TABS: 500.0000 mg | ORAL_TABLET | Freq: Two times a day (BID) | ORAL | 1 refills | Status: DC

## 2018-10-26 MED ORDER — HYDROXYZINE HCL 25 MG PO TABS: 25.0000 mg | ORAL_TABLET | Freq: Four times a day (QID) | ORAL | 0 refills | Status: DC

## 2018-10-26 MED ORDER — BUPROPION HCL ER (XL) 300 MG PO TB24: 300.0000 mg | ORAL_TABLET | Freq: Every day | ORAL | 1 refills | Status: DC

## 2018-10-26 MED ORDER — HYDROCHLOROTHIAZIDE 25 MG PO TABS: 25.0000 mg | ORAL_TABLET | Freq: Every day | ORAL | 1 refills | Status: DC

## 2018-10-26 MED ORDER — LORATADINE 10 MG PO TABS: 10.0000 mg | ORAL_TABLET | Freq: Every day | ORAL | 1 refills | Status: DC

## 2018-10-26 NOTE — Patient Instructions (Signed)
-  Nice seeing you today!!  -Lab work today; will notify you once results are available.  -Schedule follow up in 3 months. 

## 2018-10-26 NOTE — Addendum Note (Signed)
Addended by: Suzette Battiest on: 10/26/2018 11:56 AM   Modules accepted: Orders

## 2018-10-26 NOTE — Progress Notes (Signed)
Established Patient Office Visit     CC/Reason for Visit: Annual preventive exam  HPI: Kaelah Stumpf is a 48 y.o. female who is coming in today for the above mentioned reasons. Past Medical History is significant for:   1.  Morbid obesity, she is working on losing weight as she was told she needed to in order to have her right knee replaced.  2.  Bilateral knee osteoarthritis followed by orthopedics.  3.  Well-controlled hypertension  4.  Type 2 diabetes that has been well controlled  5.  Hyperlipidemia on statin  6.  Adjustment disorder/depression for which she is currently seeing a counselor.  7.  During a work-up for decreased appetite she was found on ultrasound to have not only fatty liver.  She has no acute complaints today.  She has lost 11 pounds since her last visit.  She has routine eye care, does not have a dentist.  She saw her GYN last week had a mammogram, is not due for Pap smear until next year.   Past Medical/Surgical History: Past Medical History:  Diagnosis Date  . Arthritis    knees  . Depression   . Diabetes mellitus without complication ([place])   . Hypertension   . Migraines   . Ulcer     Past Surgical History:  Procedure Laterality Date  . CESAREAN SECTION  1993  . HYSTEROSCOPY WITH NOVASURE N/A 06/02/2016   Procedure: HYSTEROSCOPY WITH NOVASURE;  Surgeon: Paula Compton, MD;  Location: [facility];  Service: Gynecology;  Laterality: N/A;  . TUBAL LIGATION  1993    Social History:  reports that she has never smoked. She has never used smokeless tobacco. She reports current alcohol use. She reports that she does not use drugs.  Allergies: Allergies  Allergen Reactions  . Other Anaphylaxis    Hair dye  . Shellfish Allergy Anaphylaxis    Closes throat  . Codeine Itching  . Menthol Hives  . Morphine And Related Itching  . Penicillins Swelling    Has patient had a PCN reaction causing immediate rash, facial/tongue/throat swelling,  SOB or lightheadedness with hypotension: Yes Has patient had a PCN reaction causing severe rash involving mucus membranes or skin necrosis: No Has patient had a PCN reaction that required hospitalization No Has patient had a PCN reaction occurring within the last 10 years: No If all of the above answers are "NO", then may proceed with Cephalosporin use.  [place] [Pseudoephedrine Hcl] Swelling  . Azithromycin Rash  . Erythromycin Rash    Family History:  Family History  Problem Relation Age of Onset  . Diabetes Mother   . Hypertension Mother   . Heart disease Mother   . Hyperlipidemia Mother   . Diabetes Father   . Hypertension Father   . Heart disease Father   . Hyperlipidemia Father   . Cervical cancer Sister   . Heart disease Sister   . Diabetes Sister   . Heart disease Sister   . Diabetes Sister      Current Outpatient Medications:  .  acetaminophen (TYLENOL) 500 MG tablet, Take 1,000 mg by mouth every 6 (six) hours as needed for headache., Disp: , Rfl:  .  atorvastatin (LIPITOR) 40 MG tablet, TAKE 1 TABLET BY MOUTH ONCE DAILY AT  6  PM, Disp: 90 tablet, Rfl: 1 .  buPROPion (WELLBUTRIN XL) 300 MG 24 hr tablet, Take 1 tablet (300 mg total) by mouth daily., Disp: 90 tablet, Rfl: 1 .  CINNAMON PO, Take 1 tablet by mouth daily., Disp: , Rfl:  .  hydrOXYzine (ATARAX/VISTARIL) 25 MG tablet, Take 1 tablet (25 mg total) by mouth every 6 (six) hours., Disp: 30 tablet, Rfl: 0 .  loratadine (CLARITIN) 10 MG tablet, Take 1 tablet (10 mg total) by mouth daily., Disp: 90 tablet, Rfl: 1 .  losartan (COZAAR) 50 MG tablet, Take 1 tablet (50 mg total) by mouth daily., Disp: 90 tablet, Rfl: 1 .  meloxicam (MOBIC) 15 MG tablet, Take 1 tablet (15 mg total) by mouth daily., Disp: 30 tablet, Rfl: 0 .  metFORMIN (GLUCOPHAGE) 500 MG tablet, Take 1 tablet (500 mg total) by mouth 2 (two) times daily with a meal., Disp: 180 tablet, Rfl: 1 .  montelukast (SINGULAIR) 10 MG tablet, Take 1 tablet (10 mg  total) by mouth at bedtime., Disp: 90 tablet, Rfl: 1 .  ondansetron (ZOFRAN ODT) 4 MG disintegrating tablet, Take 1 tablet (4 mg total) by mouth every 8 (eight) hours as needed for nausea or vomiting., Disp: 35 tablet, Rfl: 0 .  SUMAtriptan (IMITREX) 50 MG tablet, Take 1 tablet (50 mg total) by mouth every 2 (two) hours as needed for migraine. May repeat in 2 hours if headache persists or recurs., Disp: 10 tablet, Rfl: 0 .  EPINEPHrine (EPIPEN JR) 0.15 MG/0.3ML injection, Inject 0.6 mLs (0.3 mg total) into the muscle once as needed for up to 1 dose for anaphylaxis., Disp: 2 each, Rfl: 0 .  hydrochlorothiazide (HYDRODIURIL) 25 MG tablet, Take 1 tablet (25 mg total) by mouth daily., Disp: 90 tablet, Rfl: 1  Review of Systems:  Constitutional: Denies fever, chills, diaphoresis, appetite change and fatigue.  HEENT: Denies photophobia, eye pain, redness, hearing loss, ear pain, congestion, sore throat, rhinorrhea, sneezing, mouth sores, trouble swallowing, neck pain, neck stiffness and tinnitus.   Respiratory: Denies SOB, DOE, cough, chest tightness,  and wheezing.   Cardiovascular: Denies chest pain, palpitations and leg swelling.  Gastrointestinal: Denies nausea, vomiting, abdominal pain, diarrhea, constipation, blood in stool and abdominal distention.  Genitourinary: Denies dysuria, urgency, frequency, hematuria, flank pain and difficulty urinating.  Endocrine: Denies: hot or cold intolerance, sweats, changes in hair or nails, polyuria, polydipsia. Musculoskeletal: Denies myalgias, back pain, joint swelling, arthralgias and gait problem.  Skin: Denies pallor, rash and wound.  Neurological: Denies dizziness, seizures, syncope, weakness, light-headedness, numbness and headaches.  Hematological: Denies adenopathy. Easy bruising, personal or family bleeding history  Psychiatric/Behavioral: Denies suicidal ideation, mood changes, confusion, nervousness, sleep disturbance and agitation    Physical  Exam: Vitals:   10/26/18 1129  BP: (!) 98/58  Pulse: 92  Temp: 98.2 F (36.8 C)  TempSrc: Temporal  SpO2: 98%  Weight: 254 lb 1.6 oz (115.3 kg)  Height: 5' 0.5" (1.537 m)    Body mass index is 48.81 kg/m.   Constitutional: NAD, calm, comfortable, morbidly obese Eyes: PERRL, lids and conjunctivae normal, wears corrective lenses ENMT: Mucous membranes are moist.  Tympanic membrane is pearly white, no erythema or bulging. Neck: normal, supple, no masses, no thyromegaly Respiratory: clear to auscultation bilaterally, no wheezing, no crackles. Normal respiratory effort. No accessory muscle use.  Cardiovascular: Regular rate and rhythm, no murmurs / rubs / gallops. No extremity edema. 2+ pedal pulses. No carotid bruits.  Abdomen: no tenderness, no masses palpated. No hepatosplenomegaly. Bowel sounds positive.  Musculoskeletal: no clubbing / cyanosis. No joint deformity upper and lower extremities. Good ROM, no contractures. Normal muscle tone.  Skin: no rashes, lesions, ulcers. No induration Neurologic: CN  2-12 grossly intact. Sensation intact, DTR normal. Strength 5/5 in all 4.  Psychiatric: Normal judgment and insight. Alert and oriented x 3. Normal mood.    Impression and Plan:  Encounter for preventive health examination  -Has routine eye care, have recommended routine dental care. -Immunizations are up-to-date. -Discussed healthy lifestyle. -Screening labs today. -Had mammogram last week, results pending. -Not due for Pap smear until 2021. -Commence routine colon cancer screening at age 76.  Hyperlipidemia associated with type 2 diabetes mellitus ([facility])  -Last LDL 91, on atorvastatin 40 mg. -Goal LDL is less than 70, check lipids today, may need to increase statin dose pending results.  Essential hypertension -Well-controlled, continue current regimen.  Obesity, morbid ([place])  -Discussed healthy lifestyle, including increased physical activity and better food choices to  promote weight loss. -She is doing well and has lost 11 pounds in 4 weeks.  Adjustment disorder with mixed anxiety and depressed mood  -Mood is stable on Wellbutrin.  Uncontrolled type 2 diabetes mellitus with hyperglycemia ([facility])  -A1c was 7.7 in June, recheck A1c today.  Fatty liver -Check LFTs today.    Patient Instructions  -Nice seeing you today!!  -Lab work today; will notify you once results are available.  -Schedule follow up in 3 months.     Lelon Frohlich, MD [facility] at [facility]

## 2018-11-17 ENCOUNTER — Other Ambulatory Visit: Payer: Self-pay

## 2018-11-17 ENCOUNTER — Encounter: Payer: Self-pay | Admitting: Family Medicine

## 2018-11-17 ENCOUNTER — Ambulatory Visit (INDEPENDENT_AMBULATORY_CARE_PROVIDER_SITE_OTHER): Payer: Managed Care, Other (non HMO) | Admitting: Orthopaedic Surgery

## 2018-11-17 ENCOUNTER — Encounter: Payer: Self-pay | Admitting: Orthopaedic Surgery

## 2018-11-17 VITALS — Wt 258.0 lb

## 2018-11-17 DIAGNOSIS — M1712 Unilateral primary osteoarthritis, left knee: Secondary | ICD-10-CM | POA: Insufficient documentation

## 2018-11-17 DIAGNOSIS — M1711 Unilateral primary osteoarthritis, right knee: Secondary | ICD-10-CM

## 2018-11-17 DIAGNOSIS — E669 Obesity, unspecified: Secondary | ICD-10-CM

## 2018-11-17 NOTE — Progress Notes (Signed)
The patient is a very pleasant 49 year old female who is been dealing with debilitating bilateral knee osteoarthritis.  She used to be well over 300 pounds.  She is down to 258 pounds.  Her BMI is 49.56.  Her pain is daily and is detrimentally affecting her mobility, her quality of life and actives daily living.  She is being poorly controlled diabetic but her hemoglobin A1c is now 7.0.  She is frustrated and failed she cannot lose anymore weight to safely have knee replacement surgery.  Examination of both knees there is a large soft tissue envelope around both knees.  Both knees slightly hyperextend.  Unfortunate from my standpoint I am not comfortable with proceeding with total knee arthroplasty surgery due to the technical difficulty of doing this type of surgery based on her anatomy and clinical exam of the soft tissue around her knees.  It is frustrating to tell patient this because we do want to help patients but the wait aspect of things can make the surgery so challenging and the complication rate still high makes says surgeons be as cautious as possible who will perform knee replacement surgery on.  I did let her know that I have had some unfortunate complications with surgery that I performed on his like her in the past and it makes me cautious on performing surgery on the such as this.  I did reluctantly told her this and this does make her sad.  She is frustrated that her knee pain on a daily basis.  I would like to at least refer her to Dr. Greer Pickerel who does perform with all surgery just to talk to her to see what she may consider from that standpoint.  I can see her back myself in 3 months for repeat weight check and examination.

## 2018-11-26 ENCOUNTER — Encounter: Payer: Self-pay | Admitting: Family Medicine

## 2018-11-28 ENCOUNTER — Encounter: Payer: Self-pay | Admitting: Family Medicine

## 2018-11-29 ENCOUNTER — Other Ambulatory Visit: Payer: Self-pay

## 2018-11-29 ENCOUNTER — Ambulatory Visit: Payer: Managed Care, Other (non HMO) | Admitting: Family Medicine

## 2018-11-29 ENCOUNTER — Encounter: Payer: Self-pay | Admitting: Family Medicine

## 2018-11-29 DIAGNOSIS — M170 Bilateral primary osteoarthritis of knee: Secondary | ICD-10-CM

## 2018-11-29 NOTE — Assessment & Plan Note (Signed)
Bilateral injections given today.  Patient has made significant strides in her weight loss.  Patient needs to continue to try to lose weight some patient does have replacement.  Encourage patient to continue to do so over the course of time here.  We discussed icing regimen and home exercises, discussed avoiding certain activities.  We will call if symptomatic will consider the possible replacement.  Follow-up again as needed if continuing his current injections of necessary

## 2018-11-29 NOTE — Patient Instructions (Signed)
Good to see you  Ice is your friend Stay active I am proud of you! See me again in 8 weeks for sure

## 2018-11-29 NOTE — Progress Notes (Signed)
[name] [address] [place], [place] [address] Phone: [phone] Subjective:   I Lynn Fields am serving as a Education administrator for Dr. Hulan Saas.   CC: Bilateral knee pain  [id number]   08/10/2018 Patient is failed all conservative therapy at this time.  failed Viscosupplementation. Repeat injection given today.  Discussed weight loss.  Will refer to orthopedic surgery to discuss the possibility of replacement.  11/29/2018 Lynn Fields is a 48 y.o. female coming in with complaint of bilateral knee pain. States her knees are painful. Would like bilateral injections.  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  The patient has been attempting weight loss and is working on it.  Patient had seen the orthopedic surgeon who does not want to do the replacement at this moment.  She feels frustrated because she has worked fairly hard and now does not know what the next step would be.  Living with constant pain.     Past Medical History:  Diagnosis Date  . Arthritis    knees  . Depression   . Diabetes mellitus without complication ([place])   . Hypertension   . Migraines   . Ulcer    Past Surgical History:  Procedure Laterality Date  . CESAREAN SECTION  1993  . HYSTEROSCOPY WITH NOVASURE N/A 06/02/2016   Procedure: HYSTEROSCOPY WITH NOVASURE;  Surgeon: Paula Compton, MD;  Location: [facility];  Service: Gynecology;  Laterality: N/A;  . TUBAL LIGATION  1993   Social History   Socioeconomic History  . Marital status: Married    Spouse name: Not on file  . Number of children: Not on file  . Years of education: 27  . Highest education level: Not on file  Occupational History  . Occupation: Product manager: [employer]  . Financial resource strain: Not on file  . Food insecurity    Worry: Not on file    Inability: Not on file  . Transportation needs    Medical: Not on file    Non-medical: Not  on file  Tobacco Use  . Smoking status: Never Smoker  . Smokeless tobacco: Never Used  Substance and Sexual Activity  . Alcohol use: Yes    Comment: occ  . Drug use: No  . Sexual activity: Yes    Birth control/protection: Surgical  Lifestyle  . Physical activity    Days per week: Not on file    Minutes per session: Not on file  . Stress: Not on file  Relationships  . Social Herbalist on phone: Not on file    Gets together: Not on file    Attends religious service: Not on file    Active member of club or organization: Not on file    Attends meetings of clubs or organizations: Not on file    Relationship status: Not on file  Other Topics Concern  . Not on file  Social History Narrative   Regular exercise-yes   Caffeine Use-no   Allergies  Allergen Reactions  . Other Anaphylaxis    Hair dye  . Shellfish Allergy Anaphylaxis    Closes throat  . Codeine Itching  . Menthol Hives  . Morphine And Related Itching  . Penicillins Swelling    Has patient had a PCN reaction causing immediate rash, facial/tongue/throat swelling, SOB or lightheadedness with hypotension: Yes Has patient had a PCN reaction causing severe rash involving mucus membranes  or skin necrosis: No Has patient had a PCN reaction that required hospitalization No Has patient had a PCN reaction occurring within the last 10 years: No If all of the above answers are "NO", then may proceed with Cephalosporin use.  Ebbie Ridge [Pseudoephedrine Hcl] Swelling  . Azithromycin Rash  . Erythromycin Rash   Family History  Problem Relation Age of Onset  . Diabetes Mother   . Hypertension Mother   . Heart disease Mother   . Hyperlipidemia Mother   . Diabetes Father   . Hypertension Father   . Heart disease Father   . Hyperlipidemia Father   . Cervical cancer Sister   . Heart disease Sister   . Diabetes Sister   . Heart disease Sister   . Diabetes Sister     Current Outpatient Medications (Endocrine &  Metabolic):  .  metFORMIN (GLUCOPHAGE) 500 MG tablet, Take 1 tablet (500 mg total) by mouth 2 (two) times daily with a meal.  Current Outpatient Medications (Cardiovascular):  .  atorvastatin (LIPITOR) 40 MG tablet, TAKE 1 TABLET BY MOUTH ONCE DAILY AT  6  PM .  EPINEPHrine (EPIPEN JR) 0.15 MG/0.3ML injection, Inject 0.6 mLs (0.3 mg total) into the muscle once as needed for up to 1 dose for anaphylaxis. .  hydrochlorothiazide (HYDRODIURIL) 25 MG tablet, Take 1 tablet (25 mg total) by mouth daily. Marland Kitchen  losartan (COZAAR) 50 MG tablet, Take 1 tablet (50 mg total) by mouth daily.  Current Outpatient Medications (Respiratory):  .  loratadine (CLARITIN) 10 MG tablet, Take 1 tablet (10 mg total) by mouth daily. .  montelukast (SINGULAIR) 10 MG tablet, Take 1 tablet (10 mg total) by mouth at bedtime.  Current Outpatient Medications (Analgesics):  .  acetaminophen (TYLENOL) 500 MG tablet, Take 1,000 mg by mouth every 6 (six) hours as needed for headache. .  meloxicam (MOBIC) 15 MG tablet, Take 1 tablet (15 mg total) by mouth daily. .  SUMAtriptan (IMITREX) 50 MG tablet, Take 1 tablet (50 mg total) by mouth every 2 (two) hours as needed for migraine. May repeat in 2 hours if headache persists or recurs.   Current Outpatient Medications (Other):  Marland Kitchen  buPROPion (WELLBUTRIN XL) 300 MG 24 hr tablet, Take 1 tablet (300 mg total) by mouth daily. Marland Kitchen  CINNAMON PO, Take 1 tablet by mouth daily. .  hydrOXYzine (ATARAX/VISTARIL) 25 MG tablet, Take 1 tablet (25 mg total) by mouth every 6 (six) hours. .  ondansetron (ZOFRAN ODT) 4 MG disintegrating tablet, Take 1 tablet (4 mg total) by mouth every 8 (eight) hours as needed for nausea or vomiting. .  Vitamin D, Ergocalciferol, (DRISDOL) 1.25 MG (50000 UT) CAPS capsule, Take 1 capsule (50,000 Units total) by mouth every 7 (seven) days for 12 doses.    Past medical history, social, surgical and family history all reviewed in electronic medical record.  No pertanent  information unless stated regarding to the chief complaint.   Review of Systems:  No headache, visual changes, nausea, vomiting, diarrhea, constipation, dizziness, abdominal pain, skin rash, fevers, chills, night sweats, weight loss, swollen lymph nodes, body aches,,chest pain, shortness of breath, mood changes.  Positive muscle aches, joint swelling  Objective  Blood pressure 130/82, pulse 100, height 5' (1.524 m), weight 257 lb (116.6 kg), SpO2 98 %.    General: No apparent distress alert and oriented x3 mood and affect normal, dressed appropriately.  HEENT: Pupils equal, extraocular movements intact  Respiratory: Patient's speak in full sentences and does not  appear short of breath  Cardiovascular: No lower extremity edema, non tender, no erythema  Skin: Warm dry intact with no signs of infection or rash on extremities or on axial skeleton.  Abdomen: Soft nontender  Neuro: Cranial nerves II through XII are intact, neurovascularly intact in all extremities with 2+ DTRs and 2+ pulses.  Lymph: No lymphadenopathy of posterior or anterior cervical chain or axillae bilaterally.  Gait antalgic MSK:  tender with limited range of motion and stability and symmetric strength and tone of shoulders, elbows, wrist, hip and ankles bilaterally.  Knee: Bilateral valgus deformity noted.  Abnormal thigh to calf ratio.  Tender to palpation over medial and PF joint line.  ROM full in flexion and extension and lower leg rotation. instability with valgus force.  painful patellar compression. Patellar glide with moderate crepitus. Patellar and quadriceps tendons unremarkable. Hamstring and quadriceps strength is normal.   After informed written and verbal consent, patient was seated on exam table. Right knee was prepped with alcohol swab and utilizing anterolateral approach, patient's right knee space was injected with 4:1  marcaine 0.5%: Kenalog 40mg /dL. Patient tolerated the procedure well without  immediate complications.  After informed written and verbal consent, patient was seated on exam table. Left knee was prepped with alcohol swab and utilizing anterolateral approach, patient's left knee space was injected with 4:1  marcaine 0.5%: Kenalog 40mg /dL. Patient tolerated the procedure well without immediate complications.    Impression and Recommendations:     This case required medical decision making of moderate complexity. The above documentation has been reviewed and is accurate and complete Lyndal Pulley, DO       Note: This dictation was prepared with Dragon dictation along with smaller phrase technology. Any transcriptional errors that result from this process are unintentional.

## 2018-11-29 NOTE — Telephone Encounter (Signed)
Contacted patient. Appointment scheduled.

## 2018-12-01 ENCOUNTER — Encounter: Payer: Self-pay | Admitting: Internal Medicine

## 2018-12-01 ENCOUNTER — Ambulatory Visit: Payer: Self-pay

## 2018-12-01 NOTE — Telephone Encounter (Signed)
Pt. Reports her BP is up for her - 143/98 and she has a headache and dizziness. Spoke with Tammy and no availability in the practice today or tomorrow.Pt. will go to [facility]. Answer Assessment - Initial Assessment Questions 1. DESCRIPTION: "Describe your dizziness."     Dizzy 2. LIGHTHEADED: "Do you feel lightheaded?" (e.g., somewhat faint, woozy, weak upon standing)     Lightheadedness 3. VERTIGO: "Do you feel like either you or the room is spinning or tilting?" (i.e. vertigo)     No 4. SEVERITY: "How bad is it?"  "Do you feel like you are going to faint?" "Can you stand and walk?"   - MILD - walking normally   - MODERATE - interferes with normal activities (e.g., work, school)    - SEVERE - unable to stand, requires support to walk, feels like passing out now.      Mild 5. ONSET:  "When did the dizziness begin?"     Today 6. AGGRAVATING FACTORS: "Does anything make it worse?" (e.g., standing, change in head position)     Changing positions 7. HEART RATE: "Can you tell me your heart rate?" "How many beats in 15 seconds?"  (Note: not all patients can do this)       No 8. CAUSE: "What do you think is causing the dizziness?"     Unsure 9. RECURRENT SYMPTOM: "Have you had dizziness before?" If so, ask: "When was the last time?" "What happened that time?"     Last week 10. OTHER SYMPTOMS: "Do you have any other symptoms?" (e.g., fever, chest pain, vomiting, diarrhea, bleeding)       Headache, BP up 11. PREGNANCY: "Is there any chance you are pregnant?" "When was your last menstrual period?"       No  Protocols used: DIZZINESS [facility]

## 2018-12-01 NOTE — Telephone Encounter (Signed)
FYI

## 2018-12-16 ENCOUNTER — Ambulatory Visit: Payer: Managed Care, Other (non HMO) | Admitting: Internal Medicine

## 2018-12-21 ENCOUNTER — Encounter: Payer: Self-pay | Admitting: Family Medicine

## 2018-12-28 ENCOUNTER — Encounter: Payer: Self-pay | Admitting: Family Medicine

## 2019-01-14 ENCOUNTER — Encounter: Payer: Self-pay | Admitting: Family Medicine

## 2019-01-17 ENCOUNTER — Other Ambulatory Visit: Payer: Self-pay

## 2019-01-17 ENCOUNTER — Ambulatory Visit (INDEPENDENT_AMBULATORY_CARE_PROVIDER_SITE_OTHER): Payer: Managed Care, Other (non HMO) | Admitting: Family Medicine

## 2019-01-17 ENCOUNTER — Encounter: Payer: Self-pay | Admitting: Family Medicine

## 2019-01-17 DIAGNOSIS — M170 Bilateral primary osteoarthritis of knee: Secondary | ICD-10-CM | POA: Diagnosis not present

## 2019-01-17 DIAGNOSIS — E669 Obesity, unspecified: Secondary | ICD-10-CM

## 2019-01-17 NOTE — Assessment & Plan Note (Signed)
Repeat injections given today.  Discussed the possibility of PRP.  Did not have significant improvement with viscosupplementation so I do not think it would be beneficial for her to try again.  Discussed icing regimen and home exercise, which activities to avoid.  Follow-up again in 10 weeks.

## 2019-01-17 NOTE — Patient Instructions (Signed)
Happy holidays!  I hope this gives a little relief.  pennsaid pinkie amount topically 2 times daily as needed.   Ice 20 minutes 2 times daily. Usually after activity and before bed. Stay active when you can  See me again in 10 weeks or consider PRP but would cost $475

## 2019-01-17 NOTE — Progress Notes (Signed)
[facility] [address] [place], [place] [address] Phone: [phone] Subjective:    I'm seeing this patient by the request  of:    CC: Bilateral knee pain follow-up  [id number]  Lynn Fields is a 48 y.o. female coming in with complaint of bilateral knee pain.  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  Patient doing to have surgical intervention but secondary to patient's body habitus she is not a candidate at the moment.  Has been attempting to lose weight but is having significant difficulty.  Discussed which activities to do which wants to avoid previously.  Patient does not want to talk about weight loss treatment at the moment.  Here to get some relief.    Past Medical History:  Diagnosis Date  . Arthritis    knees  . Depression   . Diabetes mellitus without complication ([place])   . Hypertension   . Migraines   . Ulcer    Past Surgical History:  Procedure Laterality Date  . CESAREAN SECTION  1993  . HYSTEROSCOPY WITH NOVASURE N/A 06/02/2016   Procedure: HYSTEROSCOPY WITH NOVASURE;  Surgeon: Paula Compton, MD;  Location: [facility];  Service: Gynecology;  Laterality: N/A;  . TUBAL LIGATION  1993   Social History   Socioeconomic History  . Marital status: Married    Spouse name: Not on file  . Number of children: Not on file  . Years of education: 3  . Highest education level: Not on file  Occupational History  . Occupation: Product manager: [employer]  Tobacco Use  . Smoking status: Never Smoker  . Smokeless tobacco: Never Used  Substance and Sexual Activity  . Alcohol use: Yes    Comment: occ  . Drug use: No  . Sexual activity: Yes    Birth control/protection: Surgical  Other Topics Concern  . Not on file  Social History Narrative   Regular exercise-yes   Caffeine Use-no   Social Determinants of Health   Financial Resource Strain:   . Difficulty of Paying Living Expenses: Not  on file  Food Insecurity:   . Worried About Charity fundraiser in the Last Year: Not on file  . Ran Out of Food in the Last Year: Not on file  Transportation Needs:   . Lack of Transportation (Medical): Not on file  . Lack of Transportation (Non-Medical): Not on file  Physical Activity:   . Days of Exercise per Week: Not on file  . Minutes of Exercise per Session: Not on file  Stress:   . Feeling of Stress : Not on file  Social Connections:   . Frequency of Communication with Friends and Family: Not on file  . Frequency of Social Gatherings with Friends and Family: Not on file  . Attends Religious Services: Not on file  . Active Member of Clubs or Organizations: Not on file  . Attends Archivist Meetings: Not on file  . Marital Status: Not on file   Allergies  Allergen Reactions  . Other Anaphylaxis    Hair dye  . Shellfish Allergy Anaphylaxis    Closes throat  . Codeine Itching  . Menthol Hives  . Morphine And Related Itching  . Penicillins Swelling    Has patient had a PCN reaction causing immediate rash, facial/tongue/throat swelling, SOB or lightheadedness with hypotension: Yes Has patient had a PCN reaction causing severe rash involving mucus membranes or skin necrosis: No Has  patient had a PCN reaction that required hospitalization No Has patient had a PCN reaction occurring within the last 10 years: No If all of the above answers are "NO", then may proceed with Cephalosporin use.  Ebbie Ridge [Pseudoephedrine Hcl] Swelling  . Azithromycin Rash  . Erythromycin Rash   Family History  Problem Relation Age of Onset  . Diabetes Mother   . Hypertension Mother   . Heart disease Mother   . Hyperlipidemia Mother   . Diabetes Father   . Hypertension Father   . Heart disease Father   . Hyperlipidemia Father   . Cervical cancer Sister   . Heart disease Sister   . Diabetes Sister   . Heart disease Sister   . Diabetes Sister     Current Outpatient Medications  (Endocrine & Metabolic):  .  metFORMIN (GLUCOPHAGE) 500 MG tablet, Take 1 tablet (500 mg total) by mouth 2 (two) times daily with a meal.  Current Outpatient Medications (Cardiovascular):  .  atorvastatin (LIPITOR) 40 MG tablet, TAKE 1 TABLET BY MOUTH ONCE DAILY AT  6  PM .  EPINEPHrine (EPIPEN JR) 0.15 MG/0.3ML injection, Inject 0.6 mLs (0.3 mg total) into the muscle once as needed for up to 1 dose for anaphylaxis. .  hydrochlorothiazide (HYDRODIURIL) 25 MG tablet, Take 1 tablet (25 mg total) by mouth daily. Marland Kitchen  losartan (COZAAR) 50 MG tablet, Take 1 tablet (50 mg total) by mouth daily.  Current Outpatient Medications (Respiratory):  .  loratadine (CLARITIN) 10 MG tablet, Take 1 tablet (10 mg total) by mouth daily. .  montelukast (SINGULAIR) 10 MG tablet, Take 1 tablet (10 mg total) by mouth at bedtime.  Current Outpatient Medications (Analgesics):  .  acetaminophen (TYLENOL) 500 MG tablet, Take 1,000 mg by mouth every 6 (six) hours as needed for headache. .  meloxicam (MOBIC) 15 MG tablet, Take 1 tablet (15 mg total) by mouth daily. .  SUMAtriptan (IMITREX) 50 MG tablet, Take 1 tablet (50 mg total) by mouth every 2 (two) hours as needed for migraine. May repeat in 2 hours if headache persists or recurs.   Current Outpatient Medications (Other):  Marland Kitchen  buPROPion (WELLBUTRIN XL) 300 MG 24 hr tablet, Take 1 tablet (300 mg total) by mouth daily. Marland Kitchen  CINNAMON PO, Take 1 tablet by mouth daily. .  hydrOXYzine (ATARAX/VISTARIL) 25 MG tablet, Take 1 tablet (25 mg total) by mouth every 6 (six) hours. .  ondansetron (ZOFRAN ODT) 4 MG disintegrating tablet, Take 1 tablet (4 mg total) by mouth every 8 (eight) hours as needed for nausea or vomiting.    Past medical history, social, surgical and family history all reviewed in electronic medical record.  No pertanent information unless stated regarding to the chief complaint.   Review of Systems:  No headache, visual changes, nausea, vomiting, diarrhea,  constipation, dizziness, abdominal pain, skin rash, fevers, chills, night sweats, weight loss, swollen lymph nodes, body aches, joint swelling, muscle aches, chest pain, shortness of breath, mood changes.   Objective  There were no vitals taken for this visit.    General: No apparent distress alert and oriented x3 mood and affect normal, dressed appropriately.  Morbidly obese HEENT: Pupils equal, extraocular movements intact  Respiratory: Patient's speak in full sentences and does not appear short of breath  Cardiovascular: No lower extremity edema, non tender, no erythema  Skin: Warm dry intact with no signs of infection or rash on extremities or on axial skeleton.  Abdomen: Soft nontender  Neuro: Cranial  nerves II through XII are intact, neurovascularly intact in all extremities with 2+ DTRs and 2+ pulses.  Lymph: No lymphadenopathy of posterior or anterior cervical chain or axillae bilaterally.  Gait antalgic gait MSK:   Knee: Bilateral valgus deformity noted. Large thigh to calf ratio.  Difficult to assess secondary to patient's body habitus Tender to palpation over medial and PF joint line.  ROM full in flexion and extension and lower leg rotation. instability with valgus force.  painful patellar compression. Patellar glide with moderate crepitus. Patellar and quadriceps tendons unremarkable. Hamstring and quadriceps strength is normal.  After informed written and verbal consent, patient was seated on exam table. Right knee was prepped with alcohol swab and utilizing anterolateral approach, patient's right knee space was injected with 4:1  marcaine 0.5%: Kenalog 40mg /dL. Patient tolerated the procedure well without immediate complications.  After informed written and verbal consent, patient was seated on exam table. Left knee was prepped with alcohol swab and utilizing anterolateral approach, patient's left knee space was injected with 4:1  marcaine 0.5%: Kenalog 40mg /dL. Patient  tolerated the procedure well without immediate complications.    Impression and Recommendations:     This case required medical decision making of moderate complexity. The above documentation has been reviewed and is accurate and complete Lyndal Pulley, DO       Note: This dictation was prepared with Dragon dictation along with smaller phrase technology. Any transcriptional errors that result from this process are unintentional.

## 2019-01-24 ENCOUNTER — Telehealth: Payer: Self-pay

## 2019-01-24 NOTE — Telephone Encounter (Signed)
Copied from [facility] [id number]. Topic: General - Other >> Jan 24, 2019 10:28 AM Rainey Pines A wrote: Patient wants to know can she still be seen for appt tomorrow although she will not have her copay of $25 ready until Thursday when she gets paid . Please advise

## 2019-01-24 NOTE — Telephone Encounter (Signed)
Pt notified that she can still be seen and billed later.

## 2019-01-25 ENCOUNTER — Telehealth: Payer: Self-pay | Admitting: Family Medicine

## 2019-01-25 ENCOUNTER — Encounter: Payer: Self-pay | Admitting: Internal Medicine

## 2019-01-25 ENCOUNTER — Ambulatory Visit (INDEPENDENT_AMBULATORY_CARE_PROVIDER_SITE_OTHER): Payer: Managed Care, Other (non HMO) | Admitting: Internal Medicine

## 2019-01-25 VITALS — BP 110/80 | HR 81 | Temp 97.5°F | Wt 258.8 lb

## 2019-01-25 DIAGNOSIS — E1165 Type 2 diabetes mellitus with hyperglycemia: Secondary | ICD-10-CM

## 2019-01-25 DIAGNOSIS — E559 Vitamin D deficiency, unspecified: Secondary | ICD-10-CM | POA: Diagnosis not present

## 2019-01-25 DIAGNOSIS — I10 Essential (primary) hypertension: Secondary | ICD-10-CM | POA: Diagnosis not present

## 2019-01-25 DIAGNOSIS — Z1211 Encounter for screening for malignant neoplasm of colon: Secondary | ICD-10-CM

## 2019-01-25 DIAGNOSIS — E1169 Type 2 diabetes mellitus with other specified complication: Secondary | ICD-10-CM

## 2019-01-25 DIAGNOSIS — E785 Hyperlipidemia, unspecified: Secondary | ICD-10-CM

## 2019-01-25 LAB — VITAMIN D 25 HYDROXY (VIT D DEFICIENCY, FRACTURES): VITD: 25.03 ng/mL — ABNORMAL LOW (ref 30.00–100.00)

## 2019-01-25 LAB — POCT GLYCOSYLATED HEMOGLOBIN (HGB A1C): Hemoglobin A1C: 8.1 % — AB (ref 4.0–5.6)

## 2019-01-25 MED ORDER — GLIPIZIDE 10 MG PO TABS: 10.0000 mg | ORAL_TABLET | Freq: Every day | ORAL | 1 refills | Status: DC

## 2019-01-25 NOTE — Progress Notes (Signed)
Established Patient Office Visit     This visit occurred during the SARS-CoV-2 public health emergency.  Safety protocols were in place, including screening questions prior to the visit, additional usage of staff PPE, and extensive cleaning of exam room while observing appropriate contact time as indicated for disinfecting solutions.    CC/Reason for Visit: 37-month follow-up  HPI: Lynn Fields is a 48 y.o. female who is coming in today for the above mentioned reasons. Past Medical History is significant for:   Marland Kitchen Morbid obesity, she is working on losing weight as she was told she needed to in order to have her right knee replaced.  2. Bilateral knee osteoarthritis followed by orthopedics.  3. Well-controlled hypertension  4. Type 2 diabetes that has been well controlled  5. Hyperlipidemia on statin  6. Adjustment disorder/depression for which she is currently seeing a counselor.  #7 vitamin D deficiency, she has just completed her 12-week course of prescription vitamin D.  She is due to have her vitamin D levels rechecked.  She is due for screening colonoscopy. -She has no acute complaints today.   Past Medical/Surgical History: Past Medical History:  Diagnosis Date  . Arthritis    knees  . Depression   . Diabetes mellitus without complication ([place])   . Hypertension   . Migraines   . Ulcer     Past Surgical History:  Procedure Laterality Date  . CESAREAN SECTION  1993  . HYSTEROSCOPY WITH NOVASURE N/A 06/02/2016   Procedure: HYSTEROSCOPY WITH NOVASURE;  Surgeon: Paula Compton, MD;  Location: [facility];  Service: Gynecology;  Laterality: N/A;  . TUBAL LIGATION  1993    Social History:  reports that she has never smoked. She has never used smokeless tobacco. She reports current alcohol use. She reports that she does not use drugs.  Allergies: Allergies  Allergen Reactions  . Other Anaphylaxis    Hair dye  . Shellfish Allergy Anaphylaxis   Closes throat  . Codeine Itching  . Menthol Hives  . Morphine And Related Itching  . Penicillins Swelling    Has patient had a PCN reaction causing immediate rash, facial/tongue/throat swelling, SOB or lightheadedness with hypotension: Yes Has patient had a PCN reaction causing severe rash involving mucus membranes or skin necrosis: No Has patient had a PCN reaction that required hospitalization No Has patient had a PCN reaction occurring within the last 10 years: No If all of the above answers are "NO", then may proceed with Cephalosporin use.  Ebbie [place] [Pseudoephedrine Hcl] Swelling  . Azithromycin Rash  . Erythromycin Rash    Family History:  Family History  Problem Relation Age of Onset  . Diabetes Mother   . Hypertension Mother   . Heart disease Mother   . Hyperlipidemia Mother   . Diabetes Father   . Hypertension Father   . Heart disease Father   . Hyperlipidemia Father   . Cervical cancer Sister   . Heart disease Sister   . Diabetes Sister   . Heart disease Sister   . Diabetes Sister      Current Outpatient Medications:  .  acetaminophen (TYLENOL) 500 MG tablet, Take 1,000 mg by mouth every 6 (six) hours as needed for headache., Disp: , Rfl:  .  atorvastatin (LIPITOR) 40 MG tablet, TAKE 1 TABLET BY MOUTH ONCE DAILY AT  6  PM, Disp: 90 tablet, Rfl: 1 .  buPROPion (WELLBUTRIN XL) 300 MG 24 hr tablet, Take 1 tablet (300 mg total)  by mouth daily., Disp: 90 tablet, Rfl: 1 .  CINNAMON PO, Take 1 tablet by mouth daily., Disp: , Rfl:  .  EPINEPHrine (EPIPEN JR) 0.15 MG/0.3ML injection, Inject 0.6 mLs (0.3 mg total) into the muscle once as needed for up to 1 dose for anaphylaxis., Disp: 2 each, Rfl: 0 .  hydrochlorothiazide (HYDRODIURIL) 25 MG tablet, Take 1 tablet (25 mg total) by mouth daily., Disp: 90 tablet, Rfl: 1 .  hydrOXYzine (ATARAX/VISTARIL) 25 MG tablet, Take 1 tablet (25 mg total) by mouth every 6 (six) hours., Disp: 30 tablet, Rfl: 0 .  loratadine (CLARITIN) 10  MG tablet, Take 1 tablet (10 mg total) by mouth daily., Disp: 90 tablet, Rfl: 1 .  losartan (COZAAR) 50 MG tablet, Take 1 tablet (50 mg total) by mouth daily., Disp: 90 tablet, Rfl: 1 .  meloxicam (MOBIC) 15 MG tablet, Take 1 tablet (15 mg total) by mouth daily., Disp: 30 tablet, Rfl: 0 .  metFORMIN (GLUCOPHAGE) 500 MG tablet, Take 1 tablet (500 mg total) by mouth 2 (two) times daily with a meal., Disp: 180 tablet, Rfl: 1 .  montelukast (SINGULAIR) 10 MG tablet, Take 1 tablet (10 mg total) by mouth at bedtime., Disp: 90 tablet, Rfl: 1 .  ondansetron (ZOFRAN ODT) 4 MG disintegrating tablet, Take 1 tablet (4 mg total) by mouth every 8 (eight) hours as needed for nausea or vomiting., Disp: 35 tablet, Rfl: 0 .  SUMAtriptan (IMITREX) 50 MG tablet, Take 1 tablet (50 mg total) by mouth every 2 (two) hours as needed for migraine. May repeat in 2 hours if headache persists or recurs., Disp: 10 tablet, Rfl: 0 .  glipiZIDE (GLUCOTROL) 10 MG tablet, Take 1 tablet (10 mg total) by mouth daily before breakfast., Disp: 90 tablet, Rfl: 1  Review of Systems:  Constitutional: Denies fever, chills, diaphoresis, appetite change and fatigue.  HEENT: Denies photophobia, eye pain, redness, hearing loss, ear pain, congestion, sore throat, rhinorrhea, sneezing, mouth sores, trouble swallowing, neck pain, neck stiffness and tinnitus.   Respiratory: Denies SOB, DOE, cough, chest tightness,  and wheezing.   Cardiovascular: Denies chest pain, palpitations and leg swelling.  Gastrointestinal: Denies nausea, vomiting, abdominal pain, diarrhea, constipation, blood in stool and abdominal distention.  Genitourinary: Denies dysuria, urgency, frequency, hematuria, flank pain and difficulty urinating.  Endocrine: Denies: hot or cold intolerance, sweats, changes in hair or nails, polyuria, polydipsia. Musculoskeletal: Denies myalgias, back pain, joint swelling, arthralgias and gait problem.  Skin: Denies pallor, rash and wound.    Neurological: Denies dizziness, seizures, syncope, weakness, light-headedness, numbness and headaches.  Hematological: Denies adenopathy. Easy bruising, personal or family bleeding history  Psychiatric/Behavioral: Denies suicidal ideation, mood changes, confusion, nervousness, sleep disturbance and agitation    Physical Exam: Vitals:   01/25/19 0932  BP: 110/80  Pulse: 81  Temp: (!) 97.5 F (36.4 C)  TempSrc: Temporal  SpO2: 99%  Weight: 258 lb 12.8 oz (117.4 kg)    Body mass index is 50.54 kg/m.   Constitutional: NAD, calm, comfortable, morbidly obese Eyes: PERRL, lids and conjunctivae normal, wears corrective lenses ENMT: Mucous membranes are moist. Respiratory: clear to auscultation bilaterally, no wheezing, no crackles. Normal respiratory effort. No accessory muscle use.  Cardiovascular: Regular rate and rhythm, no murmurs / rubs / gallops. No extremity edema. 2+ pedal pulses.   Abdomen: no tenderness, no masses palpated. No hepatosplenomegaly. Bowel sounds positive.  Musculoskeletal: no clubbing / cyanosis. No joint deformity upper and lower extremities. Good ROM, no contractures. Normal muscle tone.  Skin: no rashes, lesions, ulcers. No induration Neurologic: Grossly intact and nonfocal Psychiatric: Normal judgment and insight. Alert and oriented x 3. Normal mood.    Impression and Plan:  Uncontrolled type 2 diabetes mellitus with hyperglycemia ([place])  -A1c today is uncontrolled at 8.1. -She is to continue Metformin 1000 mg twice daily, will add glipizide 10 mg daily. -She will return in 3 months for follow-up.  Obesity, morbid ([place]) -Discussed healthy lifestyle, including increased physical activity and better food choices to promote weight loss. -She is on the waiting list for healthy weight and wellness clinic.  Essential hypertension -Well-controlled, continue current regimen  Hyperlipidemia associated with type 2 diabetes mellitus ([place]) -Last LDL was 88 in  September 2020, goal is less than 70.  Vitamin D deficiency  - Plan: VITAMIN D 25 Hydroxy (Vit-D Deficiency, Fractures)  Screening for malignant neoplasm of colon  - Plan: Ambulatory referral to Gastroenterology    Patient Instructions  -Nice seeing you today!!  -Lab work today; will notify you once results are available.  -Start glipizide 10 mg daily.  -GI referral for colonoscopy.  -Schedule follow up in 3 months.     Lelon Frohlich, MD [facility] at [facility]

## 2019-01-25 NOTE — Addendum Note (Signed)
Addended by: Trenda Moots on: [id number] 10:00 AM   Modules accepted: Orders

## 2019-01-25 NOTE — Telephone Encounter (Signed)
Patient called to discuss PRP.  She is planning to have this done at her next appointment but wanted to know the recovery time after the injection.  She can be reached after 10:00am today at [phone].

## 2019-01-25 NOTE — Telephone Encounter (Signed)
Spoke with patient who would like to schedule PRP.

## 2019-01-25 NOTE — Patient Instructions (Signed)
-  Nice seeing you today!!  -Lab work today; will notify you once results are available.  -Start glipizide 10 mg daily.  -GI referral for colonoscopy.  -Schedule follow up in 3 months.

## 2019-01-31 ENCOUNTER — Encounter: Payer: Self-pay | Admitting: Gastroenterology

## 2019-01-31 ENCOUNTER — Telehealth: Payer: Self-pay | Admitting: Family Medicine

## 2019-01-31 ENCOUNTER — Telehealth: Payer: Self-pay | Admitting: *Deleted

## 2019-01-31 NOTE — Telephone Encounter (Signed)
Patient called stating that she is scheduled for PRP injections on 02/16/2019 and is also scheduled for a colonoscopy on 02/18/2019. She wanted to make sure that she will be fine to have the colonoscopy after the injections?  Please advise.

## 2019-01-31 NOTE — Telephone Encounter (Signed)
Dr Tarri Glenn,  This pt has no GI hx-   She has a BMI of 50.54- she has a hx of HTN, DM< OSA< AR, Headaches-- she has never seen anyone in GI -   Do you want her to have an OV or a direct at the hospital  Thanks, Lelan Pons PV

## 2019-01-31 NOTE — Telephone Encounter (Signed)
Brittani,  Can you please schedule this pt for a direct At [facility] screening colon- Dr Tarri Glenn   Thanks Lelan Pons pV

## 2019-01-31 NOTE — Telephone Encounter (Signed)
Direct to the hospital is fine unless the patient would prefer an office visit first. Thanks.

## 2019-01-31 NOTE — Telephone Encounter (Signed)
Spoke with patient. Ok to keep appointment.

## 2019-02-01 ENCOUNTER — Other Ambulatory Visit: Payer: Self-pay | Admitting: Internal Medicine

## 2019-02-01 DIAGNOSIS — E559 Vitamin D deficiency, unspecified: Secondary | ICD-10-CM

## 2019-02-01 MED ORDER — VITAMIN D (ERGOCALCIFEROL) 1.25 MG (50000 UNIT) PO CAPS: 50000.0000 [IU] | ORAL_CAPSULE | ORAL | 0 refills | Status: AC

## 2019-02-02 NOTE — Telephone Encounter (Signed)
Spoke with the patient who is now aware that her colonoscopy will be performed at the hospital. The patient is aware that she will receive a phone call in February to be scheduled during Dr. Tarri Glenn next hospital block at [facility] and is agreeable with this plan. [facility] colon and PV cancelled in Epic.

## 2019-02-02 NOTE — Telephone Encounter (Signed)
Pt has a PV Friday 1-8, Can you see about scheduling this and let me know. Thanks a million, Lelan Pons

## 2019-02-06 ENCOUNTER — Other Ambulatory Visit: Payer: Self-pay

## 2019-02-06 ENCOUNTER — Ambulatory Visit
Admission: EM | Admit: 2019-02-06 | Discharge: 2019-02-06 | Disposition: A | Discharge status: Home / Self Care | Payer: BC Managed Care – PPO | Attending: Physician Assistant | Admitting: Physician Assistant

## 2019-02-06 DIAGNOSIS — J302 Other seasonal allergic rhinitis: Secondary | ICD-10-CM

## 2019-02-06 DIAGNOSIS — G43019 Migraine without aura, intractable, without status migrainosus: Secondary | ICD-10-CM

## 2019-02-06 MED ORDER — DEXAMETHASONE SODIUM PHOSPHATE 10 MG/ML IJ SOLN
10.0000 mg | Freq: Once | INTRAMUSCULAR | Status: AC
  Administered 2019-02-06: 10 mg via INTRAMUSCULAR

## 2019-02-06 MED ORDER — METOCLOPRAMIDE HCL 5 MG/ML IJ SOLN
5.0000 mg | Freq: Once | INTRAMUSCULAR | Status: AC
  Administered 2019-02-06: 5 mg via INTRAMUSCULAR

## 2019-02-06 MED ORDER — ONDANSETRON 4 MG PO TBDP: 4.0000 mg | ORAL_TABLET | Freq: Three times a day (TID) | ORAL | 0 refills | Status: DC | PRN

## 2019-02-06 MED ORDER — KETOROLAC TROMETHAMINE 30 MG/ML IJ SOLN
30.0000 mg | Freq: Once | INTRAMUSCULAR | Status: AC
  Administered 2019-02-06: 30 mg via INTRAMUSCULAR

## 2019-02-06 MED ORDER — SUMATRIPTAN SUCCINATE 50 MG PO TABS: 50.0000 mg | ORAL_TABLET | Freq: Once | ORAL | 0 refills | Status: DC | PRN

## 2019-02-06 NOTE — ED Provider Notes (Addendum)
[facility]    CSN: [id number] Arrival date & time: 02/06/19  [id number]      History   Chief Complaint Chief Complaint  Patient presents with  . Migraine    HPI Lynn Fields is a 49 y.o. female.   49 year old female with history of diabetes, depression, hypertension, migraines comes in for 2-day history of migraine.  States this is right-sided, throbbing in sensation with photophobia, phonophobia.  Has nausea with one episode of nonbilious nonbloody vomit.  Has been avoiding oral intake since.  She denies head injury, loss of consciousness.  Denies weakness, dizziness, syncope.  States symptoms are same as normal migraines.  Denies URI symptoms such as cough, congestion, sore throat.  Denies fever, chills, body aches.  This is usually triggered by stress, and she thinks that this has happened.  She took sumatriptan 50 mg, 2 doses yesterday.  However, ran out of medication, and has not had any today.  She took Tylenol this morning without relief.     Past Medical History:  Diagnosis Date  . Arthritis    knees  . Depression   . Diabetes mellitus without complication ([place])   . Hypertension   . Migraines   . Ulcer     Patient Active Problem List   Diagnosis Date Noted  . Unilateral primary osteoarthritis, left knee 11/17/2018  . Unilateral primary osteoarthritis, right knee 11/17/2018  . Vitamin D deficiency 10/26/2018  . Fatty liver 10/05/2018  . Hot flashes 08/24/2018  . Poor appetite 07/27/2018  . Chronic headaches 07/27/2018  . Right hand pain 07/06/2018  . Hyperlipidemia associated with type 2 diabetes mellitus ([place]) 08/10/2017  . Allergic reaction to food 02/04/2017  . Adjustment disorder with mixed anxiety and depressed mood 08/07/2016  . Dysmenorrhea 04/15/2016  . Menorrhagia 04/15/2016  . Degenerative arthritis of knee, bilateral 02/11/2016  . Routine general medical examination at a health care facility 08/16/2015  . Essential hypertension 06/14/2012   . Uncontrolled diabetes mellitus ([place]) 06/14/2012  . Obesity, morbid ([place]) 06/14/2012    Past Surgical History:  Procedure Laterality Date  . CESAREAN SECTION  1993  . HYSTEROSCOPY WITH NOVASURE N/A 06/02/2016   Procedure: HYSTEROSCOPY WITH NOVASURE;  Surgeon: Paula Compton, MD;  Location: [facility];  Service: Gynecology;  Laterality: N/A;  . TUBAL LIGATION  1993    OB History   No obstetric history on file.      Home Medications    Prior to Admission medications   Medication Sig Fields Date End Date Taking? Authorizing Provider  acetaminophen (TYLENOL) 500 MG tablet Take 1,000 mg by mouth every 6 (six) hours as needed for headache.    [provider]  atorvastatin (LIPITOR) 40 MG tablet TAKE 1 TABLET BY MOUTH ONCE DAILY AT  6  PM 10/26/18   Isaac Bliss, Rayford Halsted, MD  buPROPion (WELLBUTRIN XL) 300 MG 24 hr tablet Take 1 tablet (300 mg total) by mouth daily. 10/26/18   Isaac Bliss, Rayford Halsted, MD  CINNAMON PO Take 1 tablet by mouth daily.    [provider]  EPINEPHrine (EPIPEN JR) 0.15 MG/0.3ML injection Inject 0.6 mLs (0.3 mg total) into the muscle once as needed for up to 1 dose for anaphylaxis. 10/26/18   Isaac Bliss, Rayford Halsted, MD  glipiZIDE (GLUCOTROL) 10 MG tablet Take 1 tablet (10 mg total) by mouth daily before breakfast. 01/25/19   Isaac Bliss, Rayford Halsted, MD  hydrochlorothiazide (HYDRODIURIL) 25 MG tablet Take 1 tablet (25 mg total) by mouth  daily. 10/26/18   Isaac Bliss, Rayford Halsted, MD  losartan (COZAAR) 50 MG tablet Take 1 tablet (50 mg total) by mouth daily. 10/26/18   Isaac Bliss, Rayford Halsted, MD  meloxicam (MOBIC) 15 MG tablet Take 1 tablet (15 mg total) by mouth daily. 10/26/18   Isaac Bliss, Rayford Halsted, MD  metFORMIN (GLUCOPHAGE) 500 MG tablet Take 1 tablet (500 mg total) by mouth 2 (two) times daily with a meal. 10/26/18   Isaac Bliss, Rayford Halsted, MD  montelukast (SINGULAIR) 10 MG tablet Take 1 tablet (10 mg total) by mouth at  bedtime. 10/26/18   Isaac Bliss, Rayford Halsted, MD  ondansetron (ZOFRAN ODT) 4 MG disintegrating tablet Take 1 tablet (4 mg total) by mouth every 8 (eight) hours as needed for nausea or vomiting. 02/06/19   Tasia Catchings, Konstantine Gervasi V, PA-C  SUMAtriptan (IMITREX) 50 MG tablet Take 1 tablet (50 mg total) by mouth once as needed for up to 1 dose for migraine. May repeat in 2 hours if headache persists or recurs. 02/06/19   Tasia Catchings, Callia Swim V, PA-C  Vitamin D, Ergocalciferol, (DRISDOL) 1.25 MG (50000 UT) CAPS capsule Take 1 capsule (50,000 Units total) by mouth every 7 (seven) days for 12 doses. 02/01/19 04/20/19  Erline Hau, MD    Family History Family History  Problem Relation Age of Onset  . Diabetes Mother   . Hypertension Mother   . Heart disease Mother   . Hyperlipidemia Mother   . Diabetes Father   . Hypertension Father   . Heart disease Father   . Hyperlipidemia Father   . Cervical cancer Sister   . Heart disease Sister   . Diabetes Sister   . Heart disease Sister   . Diabetes Sister     Social History Social History   Tobacco Use  . Smoking status: Never Smoker  . Smokeless tobacco: Never Used  Substance Use Topics  . Alcohol use: Yes    Comment: occ  . Drug use: No     Allergies   Other, Shellfish allergy, Codeine, Menthol, Morphine and related, Penicillins, Sudafed [pseudoephedrine hcl], Azithromycin, and Erythromycin   Review of Systems Review of Systems  Reason unable to perform ROS: See HPI as above.     Physical Exam Triage Vital Signs ED Triage Vitals  Enc Vitals Group     BP 02/06/19 0946 125/86     Pulse Rate 02/06/19 0946 95     Resp 02/06/19 0946 16     Temp 02/06/19 0946 98.6 F (37 C)     Temp Source 02/06/19 0946 Oral     SpO2 02/06/19 0946 98 %     Weight --      Height --      Head Circumference --      Peak Flow --      Pain Score 02/06/19 1024 9     Pain Loc --      Pain Edu? --      Excl. in [place]? --    No data found.  Updated Vital Signs BP  125/86 (BP Location: Left Arm)   Pulse 95   Temp 98.6 F (37 C) (Oral)   Resp 16   SpO2 98%   Physical Exam Constitutional:      General: She is not in acute distress.    Appearance: Normal appearance. She is not ill-appearing, toxic-appearing or diaphoretic.  HENT:     Head: Normocephalic and atraumatic.     Mouth/Throat:  Mouth: Mucous membranes are moist.     Pharynx: Oropharynx is clear. Uvula midline.  Eyes:     Extraocular Movements: Extraocular movements intact.     Conjunctiva/sclera: Conjunctivae normal.     Pupils: Pupils are equal, round, and reactive to light.     Comments: Photophobic on exam.   Cardiovascular:     Rate and Rhythm: Normal rate and regular rhythm.     Heart sounds: Normal heart sounds. No murmur. No friction rub. No gallop.   Pulmonary:     Effort: Pulmonary effort is normal. No accessory muscle usage, prolonged expiration, respiratory distress or retractions.     Comments: Lungs clear to auscultation without adventitious lung sounds. Musculoskeletal:     Cervical back: Normal range of motion and neck supple.  Neurological:     General: No focal deficit present.     Mental Status: She is alert and oriented to person, place, and time.     GCS: GCS eye subscore is 4. GCS verbal subscore is 5. GCS motor subscore is 6.     Sensory: Sensation is intact.     Motor: Motor function is intact.     Coordination: Coordination is intact.     Gait: Gait is intact.     UC Treatments / Results  Labs (all labs ordered are listed, but only abnormal results are displayed) Labs Reviewed - No data to display  EKG   Radiology No results found.  Procedures Procedures (including critical care time)  Medications Ordered in UC Medications  metoCLOPramide (REGLAN) injection 5 mg (5 mg Intramuscular Given 02/06/19 1126)  dexamethasone (DECADRON) injection 10 mg (10 mg Intramuscular Given 02/06/19 1126)  ketorolac (TORADOL) 30 MG/ML injection 30 mg (30 mg  Intramuscular Given 02/06/19 1126)    Initial Impression / Assessment and Plan / UC Course  I have reviewed the triage vital signs and the nursing notes.  Pertinent labs & imaging results that were available during my care of the patient were reviewed by me and considered in my medical decision making (see chart for details).    Toradol, Reglan, Decadron injection in office today.  Will refill 10 pills of sumatriptan as needed at this time.  Zofran as needed.  Push fluids.  Return precautions given.  Patient expresses understanding and agrees plan.  Final Clinical Impressions(s) / UC Diagnoses   Final diagnoses:  Intractable migraine without aura and without status migrainosus   ED Prescriptions    Medication Sig Dispense Auth. Provider   SUMAtriptan (IMITREX) 50 MG tablet Take 1 tablet (50 mg total) by mouth once as needed for up to 1 dose for migraine. May repeat in 2 hours if headache persists or recurs. 10 tablet Eldwin Volkov V, PA-C   ondansetron (ZOFRAN ODT) 4 MG disintegrating tablet Take 1 tablet (4 mg total) by mouth every 8 (eight) hours as needed for nausea or vomiting. 20 tablet Ok Edwards, PA-C     PDMP not reviewed this encounter.   Ok Edwards, PA-C 02/06/19 1235    Cathlean Sauer V, PA-C 02/06/19 1236

## 2019-02-06 NOTE — Discharge Instructions (Signed)
Decadron, toradol, reglan injection in office today.

## 2019-02-06 NOTE — ED Triage Notes (Signed)
Patient presents with a migraine that started x 2 days ago. Vomiting x one yesterday with nausea today.  She states she is out of her migraine medicine, took last dose on Friday.

## 2019-02-07 ENCOUNTER — Encounter: Payer: Self-pay | Admitting: Family Medicine

## 2019-02-16 ENCOUNTER — Other Ambulatory Visit: Payer: Self-pay

## 2019-02-16 ENCOUNTER — Encounter: Payer: Self-pay | Admitting: Family Medicine

## 2019-02-16 ENCOUNTER — Ambulatory Visit (INDEPENDENT_AMBULATORY_CARE_PROVIDER_SITE_OTHER): Payer: Self-pay | Admitting: Family Medicine

## 2019-02-16 DIAGNOSIS — M170 Bilateral primary osteoarthritis of knee: Secondary | ICD-10-CM

## 2019-02-16 NOTE — Progress Notes (Signed)
[place] Phone: [phone] Subjective:   Lynn Fields, am serving as a scribe for Dr. Hulan Saas. This visit occurred during the SARS-CoV-2 public health emergency.  Safety protocols were in place, including screening questions prior to the visit, additional usage of staff PPE, and extensive cleaning of exam room while observing appropriate contact time as indicated for disinfecting solutions.   I'm seeing this patient by the request  of:  Isaac Bliss, Rayford Halsted, MD  CC: Bilateral knee pain  [id number]  Lynn Fields is a 49 y.o. female coming in with complaint of bilateral knee pain Patient has had bilateral knee pain for some time.  Patient has known arthritic changes.  Patient has failed all conservative therapy and is attempting weight loss but is having difficulty in awaiting referral to weight healthy weight and wellness    Past Medical History:  Diagnosis Date  . Arthritis    knees  . Depression   . Diabetes mellitus without complication ([place])   . Hypertension   . Migraines   . Ulcer    Past Surgical History:  Procedure Laterality Date  . CESAREAN SECTION  1993  . HYSTEROSCOPY WITH NOVASURE N/A 06/02/2016   Procedure: HYSTEROSCOPY WITH NOVASURE;  Surgeon: Paula Compton, MD;  Location: [facility];  Service: Gynecology;  Laterality: N/A;  . TUBAL LIGATION  1993   Social History   Socioeconomic History  . Marital status: Married    Spouse name: Not on file  . Number of children: Not on file  . Years of education: 29  . Highest education level: Not on file  Occupational History  . Occupation: Product manager: [employer]  Tobacco Use  . Smoking status: Never Smoker  . Smokeless tobacco: Never Used  Substance and Sexual Activity  . Alcohol use: Yes    Comment: occ  . Drug use: Fields  . Sexual activity: Yes    Birth control/protection: Surgical  Other Topics  Concern  . Not on file  Social History Narrative   Regular exercise-yes   Caffeine Use-Fields   Social Determinants of Health   Financial Resource Strain:   . Difficulty of Paying Living Expenses: Not on file  Food Insecurity:   . Worried About Charity fundraiser in the Last Year: Not on file  . Ran Out of Food in the Last Year: Not on file  Transportation Needs:   . Lack of Transportation (Medical): Not on file  . Lack of Transportation (Non-Medical): Not on file  Physical Activity:   . Days of Exercise per Week: Not on file  . Minutes of Exercise per Session: Not on file  Stress:   . Feeling of Stress : Not on file  Social Connections:   . Frequency of Communication with Friends and Family: Not on file  . Frequency of Social Gatherings with Friends and Family: Not on file  . Attends Religious Services: Not on file  . Active Member of Clubs or Organizations: Not on file  . Attends Archivist Meetings: Not on file  . Marital Status: Not on file   Allergies  Allergen Reactions  . Other Anaphylaxis    Hair dye  . Shellfish Allergy Anaphylaxis    Closes throat  . Codeine Itching  . Menthol Hives  . Morphine And Related Itching  . Penicillins Swelling    Has patient had a PCN reaction causing immediate rash,  facial/tongue/throat swelling, SOB or lightheadedness with hypotension: Yes Has patient had a PCN reaction causing severe rash involving mucus membranes or skin necrosis: Fields Has patient had a PCN reaction that required hospitalization Fields Has patient had a PCN reaction occurring within the last 10 years: Fields If all of the above answers are "Fields", then may proceed with Cephalosporin use.  Ebbie Ridge [Pseudoephedrine Hcl] Swelling  . Azithromycin Rash  . Erythromycin Rash   Family History  Problem Relation Age of Onset  . Diabetes Mother   . Hypertension Mother   . Heart disease Mother   . Hyperlipidemia Mother   . Diabetes Father   . Hypertension Father   .  Heart disease Father   . Hyperlipidemia Father   . Cervical cancer Sister   . Heart disease Sister   . Diabetes Sister   . Heart disease Sister   . Diabetes Sister     Current Outpatient Medications (Endocrine & Metabolic):  .  glipiZIDE (GLUCOTROL) 10 MG tablet, Take 1 tablet (10 mg total) by mouth daily before breakfast. .  metFORMIN (GLUCOPHAGE) 500 MG tablet, Take 1 tablet (500 mg total) by mouth 2 (two) times daily with a meal.  Current Outpatient Medications (Cardiovascular):  .  atorvastatin (LIPITOR) 40 MG tablet, TAKE 1 TABLET BY MOUTH ONCE DAILY AT  6  PM .  EPINEPHrine (EPIPEN JR) 0.15 MG/0.3ML injection, Inject 0.6 mLs (0.3 mg total) into the muscle once as needed for up to 1 dose for anaphylaxis. .  hydrochlorothiazide (HYDRODIURIL) 25 MG tablet, Take 1 tablet (25 mg total) by mouth daily. Marland Kitchen  losartan (COZAAR) 50 MG tablet, Take 1 tablet (50 mg total) by mouth daily.  Current Outpatient Medications (Respiratory):  .  montelukast (SINGULAIR) 10 MG tablet, Take 1 tablet (10 mg total) by mouth at bedtime.  Current Outpatient Medications (Analgesics):  .  acetaminophen (TYLENOL) 500 MG tablet, Take 1,000 mg by mouth every 6 (six) hours as needed for headache. .  meloxicam (MOBIC) 15 MG tablet, Take 1 tablet (15 mg total) by mouth daily. .  SUMAtriptan (IMITREX) 50 MG tablet, Take 1 tablet (50 mg total) by mouth once as needed for up to 1 dose for migraine. May repeat in 2 hours if headache persists or recurs.   Current Outpatient Medications (Other):  Marland Kitchen  buPROPion (WELLBUTRIN XL) 300 MG 24 hr tablet, Take 1 tablet (300 mg total) by mouth daily. Marland Kitchen  CINNAMON PO, Take 1 tablet by mouth daily. .  ondansetron (ZOFRAN ODT) 4 MG disintegrating tablet, Take 1 tablet (4 mg total) by mouth every 8 (eight) hours as needed for nausea or vomiting. .  Vitamin D, Ergocalciferol, (DRISDOL) 1.25 MG (50000 UT) CAPS capsule, Take 1 capsule (50,000 Units total) by mouth every 7 (seven) days for  12 doses.    Past medical history, social, surgical and family history all reviewed in electronic medical record.  Fields pertanent information unless stated regarding to the chief complaint.   Review of Systems:  Fields headache, visual changes, nausea, vomiting, diarrhea, constipation, dizziness, abdominal pain, skin rash, fevers, chills, night sweats, weight loss, swollen lymph nodes,  chest pain, shortness of breath, mood changes. POSITIVE muscle aches, body aches, joint swelling  Objective  Height 5' (1.524 m).   General: Fields apparent distress alert and oriented x3 mood and affect normal, dressed appropriately.  Morbidly obese Gait antalgic MSK:   Knee: Bilateral valgus deformity noted. Large thigh to calf ratio.  Tender to palpation over medial and  PF joint line.  ROM full in flexion and extension and lower leg rotation. instability with valgus force.  painful patellar compression. Patellar glide with moderate crepitus. Patellar and quadriceps tendons unremarkable. Hamstring and quadriceps strength is normal.  After informed written and verbal consent, patient was seated on exam table. Right knee was prepped with alcohol swab and utilizing anterolateral approach, patient's right knee space was injected with 2 cc of 0.5% Marcaine and then 3 cc of precentrifuged PRP with a 21-gauge 2 inch needle.  Patient tolerated the procedure well without immediate complications.  After informed written and verbal consent, patient was seated on exam table. Left knee was prepped with alcohol swab and utilizing anterolateral approach, patient's left knee space was injected with 2 cc of 0.5% Marcaine and then injected 3 cc of present diffuse PRP patient tolerated the procedure well without immediate complications.    Impression and Recommendations:     This case required medical decision making of moderate complexity. The above documentation has been reviewed and is accurate and complete Lyndal Pulley,  DO       Note: This dictation was prepared with Dragon dictation along with smaller phrase technology. Any transcriptional errors that result from this process are unintentional.

## 2019-02-16 NOTE — Assessment & Plan Note (Signed)
PRP injection given today.  Discussed potential for flares.  Follow-up again 3 to 4 weeks

## 2019-02-16 NOTE — Patient Instructions (Signed)
No ice or IBU for 3 days See me back in 3 weeks

## 2019-02-17 ENCOUNTER — Ambulatory Visit: Payer: Managed Care, Other (non HMO) | Admitting: Orthopaedic Surgery

## 2019-02-18 ENCOUNTER — Encounter: Payer: Managed Care, Other (non HMO) | Admitting: Gastroenterology

## 2019-03-07 NOTE — Progress Notes (Signed)
[place] Phone: [phone] Subjective:   Lynn Fields, am serving as a scribe for Dr. Hulan Saas. This visit occurred during the SARS-CoV-2 public health emergency.  Safety protocols were in place, including screening questions prior to the visit, additional usage of staff PPE, and extensive cleaning of exam room while observing appropriate contact time as indicated for disinfecting solutions.   I'm seeing this patient by the request  of:  Isaac Bliss, Rayford Halsted, MD  CC: knee pain follow up   [id number]   02/16/2019 PRP injection given today.  Discussed potential for flares.  Follow-up again 3 to 4 weeks  Update 03/08/2019 Lynn Fields is a 49 y.o. female coming in with complaint of bilateral knee pain. Patient states that right is bothering her more than left. Pain over patellar tendon and over medial aspect. Does not note a difference since having PRP. Left is sore but not too painful today. Fields new injury.     Past Medical History:  Diagnosis Date  . Arthritis    knees  . Depression   . Diabetes mellitus without complication ([place])   . Hypertension   . Migraines   . Ulcer    Past Surgical History:  Procedure Laterality Date  . CESAREAN SECTION  1993  . HYSTEROSCOPY WITH NOVASURE N/A 06/02/2016   Procedure: HYSTEROSCOPY WITH NOVASURE;  Surgeon: Paula Compton, MD;  Location: [facility];  Service: Gynecology;  Laterality: N/A;  . TUBAL LIGATION  1993   Social History   Socioeconomic History  . Marital status: Married    Spouse name: Not on file  . Number of children: Not on file  . Years of education: 68  . Highest education level: Not on file  Occupational History  . Occupation: Product manager: [employer]  Tobacco Use  . Smoking status: Never Smoker  . Smokeless tobacco: Never Used  Substance and Sexual Activity  . Alcohol use: Yes    Comment: occ  . Drug use: Fields    . Sexual activity: Yes    Birth control/protection: Surgical  Other Topics Concern  . Not on file  Social History Narrative   Regular exercise-yes   Caffeine Use-Fields   Social Determinants of Health   Financial Resource Strain:   . Difficulty of Paying Living Expenses: Not on file  Food Insecurity:   . Worried About Charity fundraiser in the Last Year: Not on file  . Ran Out of Food in the Last Year: Not on file  Transportation Needs:   . Lack of Transportation (Medical): Not on file  . Lack of Transportation (Non-Medical): Not on file  Physical Activity:   . Days of Exercise per Week: Not on file  . Minutes of Exercise per Session: Not on file  Stress:   . Feeling of Stress : Not on file  Social Connections:   . Frequency of Communication with Friends and Family: Not on file  . Frequency of Social Gatherings with Friends and Family: Not on file  . Attends Religious Services: Not on file  . Active Member of Clubs or Organizations: Not on file  . Attends Archivist Meetings: Not on file  . Marital Status: Not on file   Allergies  Allergen Reactions  . Other Anaphylaxis    Hair dye  . Shellfish Allergy Anaphylaxis    Closes throat  . Codeine Itching  . Menthol Hives  .  Morphine And Related Itching  . Penicillins Swelling    Has patient had a PCN reaction causing immediate rash, facial/tongue/throat swelling, SOB or lightheadedness with hypotension: Yes Has patient had a PCN reaction causing severe rash involving mucus membranes or skin necrosis: Fields Has patient had a PCN reaction that required hospitalization Fields Has patient had a PCN reaction occurring within the last 10 years: Fields If all of the above answers are "Fields", then may proceed with Cephalosporin use.  Ebbie Ridge [Pseudoephedrine Hcl] Swelling  . Azithromycin Rash  . Erythromycin Rash   Family History  Problem Relation Age of Onset  . Diabetes Mother   . Hypertension Mother   . Heart disease Mother    . Hyperlipidemia Mother   . Diabetes Father   . Hypertension Father   . Heart disease Father   . Hyperlipidemia Father   . Cervical cancer Sister   . Heart disease Sister   . Diabetes Sister   . Heart disease Sister   . Diabetes Sister     Current Outpatient Medications (Endocrine & Metabolic):  .  glipiZIDE (GLUCOTROL) 10 MG tablet, Take 1 tablet (10 mg total) by mouth daily before breakfast. .  metFORMIN (GLUCOPHAGE) 500 MG tablet, Take 1 tablet (500 mg total) by mouth 2 (two) times daily with a meal.  Current Outpatient Medications (Cardiovascular):  .  atorvastatin (LIPITOR) 40 MG tablet, TAKE 1 TABLET BY MOUTH ONCE DAILY AT  6  PM .  EPINEPHrine (EPIPEN JR) 0.15 MG/0.3ML injection, Inject 0.6 mLs (0.3 mg total) into the muscle once as needed for up to 1 dose for anaphylaxis. .  hydrochlorothiazide (HYDRODIURIL) 25 MG tablet, Take 1 tablet (25 mg total) by mouth daily. Marland Kitchen  losartan (COZAAR) 50 MG tablet, Take 1 tablet (50 mg total) by mouth daily.  Current Outpatient Medications (Respiratory):  .  montelukast (SINGULAIR) 10 MG tablet, Take 1 tablet (10 mg total) by mouth at bedtime.  Current Outpatient Medications (Analgesics):  .  acetaminophen (TYLENOL) 500 MG tablet, Take 1,000 mg by mouth every 6 (six) hours as needed for headache. .  meloxicam (MOBIC) 15 MG tablet, Take 1 tablet (15 mg total) by mouth daily. .  SUMAtriptan (IMITREX) 50 MG tablet, Take 1 tablet (50 mg total) by mouth once as needed for up to 1 dose for migraine. May repeat in 2 hours if headache persists or recurs.   Current Outpatient Medications (Other):  Marland Kitchen  buPROPion (WELLBUTRIN XL) 300 MG 24 hr tablet, Take 1 tablet (300 mg total) by mouth daily. Marland Kitchen  CINNAMON PO, Take 1 tablet by mouth daily. .  ondansetron (ZOFRAN ODT) 4 MG disintegrating tablet, Take 1 tablet (4 mg total) by mouth every 8 (eight) hours as needed for nausea or vomiting. .  Vitamin D, Ergocalciferol, (DRISDOL) 1.25 MG (50000 UT) CAPS  capsule, Take 1 capsule (50,000 Units total) by mouth every 7 (seven) days for 12 doses.   Reviewed prior external information including notes and imaging from  primary care provider As well as notes that were available from care everywhere and other healthcare systems.  Past medical history, social, surgical and family history all reviewed in electronic medical record.  Fields pertanent information unless stated regarding to the chief complaint.   Review of Systems:  Fields headache, visual changes, nausea, vomiting, diarrhea, constipation, dizziness, abdominal pain, skin rash, fevers, chills, night sweats, weight loss, swollen lymph nodes, body aches, joint swelling, chest pain, shortness of breath, mood changes. POSITIVE muscle aches  Objective  Blood pressure 118/84, pulse 94, height 5' (1.524 m), weight 256 lb (116.1 kg), SpO2 97 %.   General: Fields apparent distress alert and oriented x3 mood and affect normal, dressed appropriately.  HEENT: Pupils equal, extraocular movements intact  Respiratory: Patient's speak in full sentences and does not appear short of breath  Cardiovascular: Fields lower extremity edema, non tender, Fields erythema  Skin: Warm dry intact with Fields signs of infection or rash on extremities or on axial skeleton.  Abdomen: Soft nontender  Neuro: Cranial nerves II through XII are intact, neurovascularly intact in all extremities with 2+ DTRs and 2+ pulses.  Lymph: Fields lymphadenopathy of posterior or anterior cervical chain or axillae bilaterally.  Gait normal with good balance and coordination.  MSK: Knee: Right valgus deformity noted. Large thigh to calf ratio.  Tender to palpation over medial and PF joint line.  ROM full in flexion and extension and lower leg rotation. instability with valgus force.  painful patellar compression. Patellar glide with moderate crepitus. Patellar and quadriceps tendons unremarkable. Hamstring and quadriceps strength is normal. Contralateral knee  shows instability but minimal pain today which is an improvement  After informed written and verbal consent, patient was seated on exam table. Right knee was prepped with alcohol swab and utilizing anterolateral approach, patient's right knee space was injected with 4:1  marcaine 0.5%: Kenalog 40mg /dL and also added 1 cc of 30 mg/cc of  Toradol. Patient tolerated the procedure well without immediate complications.   Impression and Recommendations:     This case required medical decision making of moderate complexity. The above documentation has been reviewed and is accurate and complete Lyndal Pulley, DO       Note: This dictation was prepared with Dragon dictation along with smaller phrase technology. Any transcriptional errors that result from this process are unintentional.

## 2019-03-08 ENCOUNTER — Ambulatory Visit: Payer: BC Managed Care – PPO | Admitting: Family Medicine

## 2019-03-08 ENCOUNTER — Other Ambulatory Visit: Payer: Self-pay

## 2019-03-08 ENCOUNTER — Encounter: Payer: Self-pay | Admitting: Family Medicine

## 2019-03-08 DIAGNOSIS — M170 Bilateral primary osteoarthritis of knee: Secondary | ICD-10-CM

## 2019-03-08 NOTE — Patient Instructions (Signed)
Good to see you Trying something else Ice Pennsaid Send me a message Hope you get the vaccine Keep working on the weight  See me again in 8 weeks

## 2019-03-08 NOTE — Assessment & Plan Note (Signed)
Bilateral knee pain.  Still seems to be right side worse than left.  Patient given an injection with Toradol mixture today to see if this would be more beneficial with patient failing all other conservative therapy.  Encourage patient to potentially lose weight but this is been difficult.  Patient is considering bariatric surgery.

## 2019-03-10 ENCOUNTER — Encounter: Payer: Self-pay | Admitting: Family Medicine

## 2019-03-21 ENCOUNTER — Telehealth: Payer: Self-pay | Admitting: Internal Medicine

## 2019-03-21 NOTE — Telephone Encounter (Signed)
Pt would like to know if it is okay for her to receive the COVID vaccine they are doing it at her job starting Thursday.  Pt would like to have a call back to let her know.

## 2019-03-22 ENCOUNTER — Encounter: Payer: Self-pay | Admitting: Family Medicine

## 2019-03-22 NOTE — Telephone Encounter (Signed)
Spoke with patient and okay for Covid vaccine

## 2019-04-04 ENCOUNTER — Encounter: Payer: Self-pay | Admitting: Gastroenterology

## 2019-04-08 ENCOUNTER — Encounter: Payer: Self-pay | Admitting: Internal Medicine

## 2019-04-08 NOTE — Telephone Encounter (Signed)
Spoke with patient and her fasting glucose in the mornings are averaging around at 150 and then drop after eating to 70.  Please advise

## 2019-04-08 NOTE — Telephone Encounter (Signed)
Patient is aware and will take the glipizide after eating breakfast.

## 2019-04-10 ENCOUNTER — Ambulatory Visit
Admission: EM | Admit: 2019-04-10 | Discharge: 2019-04-10 | Disposition: A | Discharge status: Home / Self Care | Payer: BC Managed Care – PPO | Attending: Emergency Medicine | Admitting: Emergency Medicine

## 2019-04-10 ENCOUNTER — Other Ambulatory Visit: Payer: Self-pay

## 2019-04-10 ENCOUNTER — Encounter: Payer: Self-pay | Admitting: Emergency Medicine

## 2019-04-10 DIAGNOSIS — N3001 Acute cystitis with hematuria: Secondary | ICD-10-CM | POA: Diagnosis not present

## 2019-04-10 DIAGNOSIS — B9689 Other specified bacterial agents as the cause of diseases classified elsewhere: Secondary | ICD-10-CM | POA: Diagnosis not present

## 2019-04-10 LAB — POCT URINALYSIS DIP (MANUAL ENTRY)
Bilirubin, UA: NEGATIVE
Glucose, UA: NEGATIVE mg/dL
Ketones, POC UA: NEGATIVE mg/dL
Nitrite, UA: POSITIVE — AB
Spec Grav, UA: >=1.03 — AB (ref 1.010–1.025)
Urobilinogen, UA: 0.2 E.U./dL
pH, UA: 5.5 (ref 5.0–8.0)

## 2019-04-10 MED ORDER — FLUCONAZOLE 200 MG PO TABS: 200.0000 mg | ORAL_TABLET | Freq: Once | ORAL | 0 refills | Status: AC

## 2019-04-10 MED ORDER — NITROFURANTOIN MONOHYD MACRO 100 MG PO CAPS: 100.0000 mg | ORAL_CAPSULE | Freq: Two times a day (BID) | ORAL | 0 refills | Status: DC

## 2019-04-10 NOTE — ED Provider Notes (Signed)
[facility]    CSN: [id number] Arrival date & time: 04/10/19  0855      History   Chief Complaint Chief Complaint  Patient presents with  . Dysuria    HPI Lynn Fields is a 49 y.o. female with history of obesity, hypertension, diabetes presenting for 2-day course of urinary frequency, urgency with no associated vaginal irritation.  Patient feels vaginal irritation occurred after using Azo for urinary symptoms.  Patient denied hematuria, vaginal discharge, pelvic, abdominal, back pain, fever.  Patient states that she tried using a bath of vinegar, baking soda without significant relief.   Past Medical History:  Diagnosis Date  . Arthritis    knees  . Depression   . Diabetes mellitus without complication ([place])   . Hypertension   . Migraines   . Ulcer     Patient Active Problem List   Diagnosis Date Noted  . Unilateral primary osteoarthritis, left knee 11/17/2018  . Unilateral primary osteoarthritis, right knee 11/17/2018  . Vitamin D deficiency 10/26/2018  . Fatty liver 10/05/2018  . Hot flashes 08/24/2018  . Poor appetite 07/27/2018  . Chronic headaches 07/27/2018  . Right hand pain 07/06/2018  . Hyperlipidemia associated with type 2 diabetes mellitus ([place]) 08/10/2017  . Allergic reaction to food 02/04/2017  . Adjustment disorder with mixed anxiety and depressed mood 08/07/2016  . Dysmenorrhea 04/15/2016  . Menorrhagia 04/15/2016  . Degenerative arthritis of knee, bilateral 02/11/2016  . Routine general medical examination at a health care facility 08/16/2015  . Essential hypertension 06/14/2012  . Uncontrolled diabetes mellitus ([place]) 06/14/2012  . Obesity, morbid ([place]) 06/14/2012    Past Surgical History:  Procedure Laterality Date  . CESAREAN SECTION  1993  . HYSTEROSCOPY WITH NOVASURE N/A 06/02/2016   Procedure: HYSTEROSCOPY WITH NOVASURE;  Surgeon: Paula Compton, MD;  Location: [facility];  Service: Gynecology;  Laterality: N/A;  . TUBAL  LIGATION  1993    OB History   No obstetric history on file.      Home Medications    Prior to Admission medications   Medication Sig Start Date End Date Taking? Authorizing Provider  acetaminophen (TYLENOL) 500 MG tablet Take 1,000 mg by mouth every 6 (six) hours as needed for headache.    [provider]  atorvastatin (LIPITOR) 40 MG tablet TAKE 1 TABLET BY MOUTH ONCE DAILY AT  6  PM 10/26/18   Isaac Bliss, Rayford Halsted, MD  buPROPion (WELLBUTRIN XL) 300 MG 24 hr tablet Take 1 tablet (300 mg total) by mouth daily. 10/26/18   Isaac Bliss, Rayford Halsted, MD  CINNAMON PO Take 1 tablet by mouth daily.    [provider]  EPINEPHrine (EPIPEN JR) 0.15 MG/0.3ML injection Inject 0.6 mLs (0.3 mg total) into the muscle once as needed for up to 1 dose for anaphylaxis. 10/26/18   Isaac Bliss, Rayford Halsted, MD  fluconazole (DIFLUCAN) 200 MG tablet Take 1 tablet (200 mg total) by mouth once for 1 dose. May repeat in 72 hours if needed 04/10/19 04/10/19  Hall-Potvin, [place], PA-C  glipiZIDE (GLUCOTROL) 10 MG tablet Take 1 tablet (10 mg total) by mouth daily before breakfast. 01/25/19   Isaac Bliss, Rayford Halsted, MD  hydrochlorothiazide (HYDRODIURIL) 25 MG tablet Take 1 tablet (25 mg total) by mouth daily. 10/26/18   Isaac Bliss, Rayford Halsted, MD  losartan (COZAAR) 50 MG tablet Take 1 tablet (50 mg total) by mouth daily. 10/26/18   Isaac Bliss, Rayford Halsted, MD  meloxicam (MOBIC) 15 MG tablet Take  1 tablet (15 mg total) by mouth daily. 10/26/18   Isaac Bliss, Rayford Halsted, MD  metFORMIN (GLUCOPHAGE) 500 MG tablet Take 1 tablet (500 mg total) by mouth 2 (two) times daily with a meal. 10/26/18   Isaac Bliss, Rayford Halsted, MD  montelukast (SINGULAIR) 10 MG tablet Take 1 tablet (10 mg total) by mouth at bedtime. 10/26/18   Isaac Bliss, Rayford Halsted, MD  nitrofurantoin, macrocrystal-monohydrate, (MACROBID) 100 MG capsule Take 1 capsule (100 mg total) by mouth 2 (two) times daily. 04/10/19    Hall-Potvin, [place], PA-C  ondansetron (ZOFRAN ODT) 4 MG disintegrating tablet Take 1 tablet (4 mg total) by mouth every 8 (eight) hours as needed for nausea or vomiting. 02/06/19   Tasia Catchings, Amy V, PA-C  SUMAtriptan (IMITREX) 50 MG tablet Take 1 tablet (50 mg total) by mouth once as needed for up to 1 dose for migraine. May repeat in 2 hours if headache persists or recurs. 02/06/19   Tasia Catchings, Amy V, PA-C  Vitamin D, Ergocalciferol, (DRISDOL) 1.25 MG (50000 UT) CAPS capsule Take 1 capsule (50,000 Units total) by mouth every 7 (seven) days for 12 doses. 02/01/19 04/20/19  Erline Hau, MD    Family History Family History  Problem Relation Age of Onset  . Diabetes Mother   . Hypertension Mother   . Heart disease Mother   . Hyperlipidemia Mother   . Diabetes Father   . Hypertension Father   . Heart disease Father   . Hyperlipidemia Father   . Cervical cancer Sister   . Heart disease Sister   . Diabetes Sister   . Heart disease Sister   . Diabetes Sister     Social History Social History   Tobacco Use  . Smoking status: Never Smoker  . Smokeless tobacco: Never Used  Substance Use Topics  . Alcohol use: Yes    Comment: occ  . Drug use: No     Allergies   Iodine, Other, Shellfish allergy, Codeine, Menthol, Morphine and related, Penicillins, Sudafed [pseudoephedrine hcl], Azithromycin, and Erythromycin   Review of Systems As per HPI   Physical Exam Triage Vital Signs ED Triage Vitals  Enc Vitals Group     BP      Pulse      Resp      Temp      Temp src      SpO2      Weight      Height      Head Circumference      Peak Flow      Pain Score      Pain Loc      Pain Edu?      Excl. in [place]?    No data found.  Updated Vital Signs BP (!) 129/92 (BP Location: Right Arm)   Pulse 87   Temp 98.4 F (36.9 C) (Temporal)   Resp 18   SpO2 95%   Visual Acuity Right Eye Distance:   Left Eye Distance:   Bilateral Distance:    Right Eye Near:   Left Eye Near:      Bilateral Near:     Physical Exam Constitutional:      General: She is not in acute distress.    Appearance: She is obese. She is not ill-appearing.  HENT:     Head: Normocephalic and atraumatic.  Eyes:     General: No scleral icterus.    Pupils: Pupils are equal, round, and reactive to light.  Cardiovascular:  Rate and Rhythm: Normal rate.  Pulmonary:     Effort: Pulmonary effort is normal. No respiratory distress.     Breath sounds: No wheezing.  Genitourinary:    Comments: Patient declined Skin:    Coloration: Skin is not jaundiced or pale.  Neurological:     Mental Status: She is alert and oriented to person, place, and time.      UC Treatments / Results  Labs (all labs ordered are listed, but only abnormal results are displayed) Labs Reviewed  POCT URINALYSIS DIP (MANUAL ENTRY) - Abnormal; Notable for the following components:      Result Value   Spec Grav, UA >=1.030 (*)    Blood, UA trace-intact (*)    Protein Ur, POC trace (*)    Nitrite, UA Positive (*)    Leukocytes, UA Trace (*)    All other components within normal limits  URINE CULTURE    EKG   Radiology No results found.  Procedures Procedures (including critical care time)  Medications Ordered in UC Medications - No data to display  Initial Impression / Assessment and Plan / UC Course  I have reviewed the triage vital signs and the nursing notes.  Pertinent labs & imaging results that were available during my care of the patient were reviewed by me and considered in my medical decision making (see chart for details).     Patient afebrile, nontoxic in office today.  Patient is status post ablation: Pregnancy deferred.  Urine dipstick significant for elevated specific gravity, trace intact blood, trace protein, positive nitrates and trace leukocytes.  Culture pending.  Patient does endorse history of numerous medication allergies and intolerances due to ADRs.  Has never taken Macrobid  before: We will try and monitor closely for ADRs.  Patient to avoid vinegar baths as this is likely contributing to vaginal irritation.  Reviewed appropriate vaginal hygiene practices with patient.  Diflucan sent as patient reports yeast infections after antibiotic use.  Return precautions discussed, patient verbalized understanding and is agreeable to plan. Final Clinical Impressions(s) / UC Diagnoses   Final diagnoses:  Acute cystitis with hematuria     Discharge Instructions     Take antibiotics as directed. May follow course with diflucan for yeast. Follow up with PCP for persistent/worsening symptoms.    ED Prescriptions    Medication Sig Dispense Auth. Provider   nitrofurantoin, macrocrystal-monohydrate, (MACROBID) 100 MG capsule Take 1 capsule (100 mg total) by mouth 2 (two) times daily. 10 capsule Hall-Potvin, [place], PA-C   fluconazole (DIFLUCAN) 200 MG tablet Take 1 tablet (200 mg total) by mouth once for 1 dose. May repeat in 72 hours if needed 2 tablet Hall-Potvin, [place], PA-C     PDMP not reviewed this encounter.   Neldon Mc [place], [place] 04/10/19 [phone]

## 2019-04-10 NOTE — ED Triage Notes (Addendum)
Pt presents to [facility] for assessment of 2 days of urinary frequency and urgency.  Tried OTC medication (similar to AZO) and is now having vaginal irritation.  Patient states hx of allergic reactions to medications and dyes.  Denies abdominal pain.  Patient states she has tried a bath with vinegar, and a bath with baking soda without relief.

## 2019-04-10 NOTE — Discharge Instructions (Signed)
Take antibiotics as directed. May follow course with diflucan for yeast. Follow up with PCP for persistent/worsening symptoms.

## 2019-04-13 LAB — URINE CULTURE: Culture: >=100000 — AB

## 2019-04-18 ENCOUNTER — Other Ambulatory Visit: Payer: Self-pay

## 2019-04-18 ENCOUNTER — Encounter: Payer: Self-pay | Admitting: Family Medicine

## 2019-04-18 ENCOUNTER — Ambulatory Visit: Payer: BC Managed Care – PPO | Admitting: Family Medicine

## 2019-04-18 DIAGNOSIS — M170 Bilateral primary osteoarthritis of knee: Secondary | ICD-10-CM | POA: Diagnosis not present

## 2019-04-18 NOTE — Assessment & Plan Note (Signed)
Chronic problem : exacerbation   interventions previously, including medication management:  Injections, once weekly vitamin D, meloxicam     Interventions this visit: injections  We discussed with patient the importance ergonomics, home exercises, icing regimen, and over-the-counter natural products.   Future considerations but will be based on evaluation and next visit:   Visco or PRP     Return to clinic: 8 weeks

## 2019-04-18 NOTE — Progress Notes (Signed)
[facility] [address] [place] Phone: [phone] Subjective:   I Lynn Fields am serving as a Education administrator for Dr. Hulan Saas.  This visit occurred during the SARS-CoV-2 public health emergency.  Safety protocols were in place, including screening questions prior to the visit, additional usage of staff PPE, and extensive cleaning of exam room while observing appropriate contact time as indicated for disinfecting solutions.   I'm seeing this patient by the request  of:  Isaac Bliss, Rayford Halsted, MD  CC: Bilateral knee pain follow-up  [id number]   03/08/2019 Bilateral knee pain.  Still seems to be right side worse than left.  Patient given an injection with Toradol mixture today to see if this would be more beneficial with patient failing all other conservative therapy.  Encourage patient to potentially lose weight but this is been difficult.  Patient is considering bariatric surgery.  Update 04/18/2019 Lynn Fields is a 49 y.o. female coming in with complaint of bilateral knee pain. Patient states her knees were doing well. Stepped on a gumball that caused the pain.  Known arthritic changes.  Chronic problem currently exacerbation.  Patient is still not a candidate for replacement secondary to patient's body habitus.  Patient has been doing relatively well overall     Past Medical History:  Diagnosis Date  . Arthritis    knees  . Depression   . Diabetes mellitus without complication ([place])   . Hypertension   . Migraines   . Ulcer    Past Surgical History:  Procedure Laterality Date  . CESAREAN SECTION  1993  . HYSTEROSCOPY WITH NOVASURE N/A 06/02/2016   Procedure: HYSTEROSCOPY WITH NOVASURE;  Surgeon: Paula Compton, MD;  Location: [facility];  Service: Gynecology;  Laterality: N/A;  . TUBAL LIGATION  1993   Social History   Socioeconomic History  . Marital status: Married    Spouse name: Not on file  . Number of children:  Not on file  . Years of education: 71  . Highest education level: Not on file  Occupational History  . Occupation: Product manager: [employer]  Tobacco Use  . Smoking status: Never Smoker  . Smokeless tobacco: Never Used  Substance and Sexual Activity  . Alcohol use: Yes    Comment: occ  . Drug use: No  . Sexual activity: Yes    Birth control/protection: Surgical  Other Topics Concern  . Not on file  Social History Narrative   Regular exercise-yes   Caffeine Use-no   Social Determinants of Health   Financial Resource Strain:   . Difficulty of Paying Living Expenses:   Food Insecurity:   . Worried About Charity fundraiser in the Last Year:   . Arboriculturist in the Last Year:   Transportation Needs:   . Film/video editor (Medical):   Marland Kitchen Lack of Transportation (Non-Medical):   Physical Activity:   . Days of Exercise per Week:   . Minutes of Exercise per Session:   Stress:   . Feeling of Stress :   Social Connections:   . Frequency of Communication with Friends and Family:   . Frequency of Social Gatherings with Friends and Family:   . Attends Religious Services:   . Active Member of Clubs or Organizations:   . Attends Archivist Meetings:   Marland Kitchen Marital Status:    Allergies  Allergen Reactions  . Iodine Anaphylaxis  . Other Anaphylaxis  Hair dye  . Shellfish Allergy Anaphylaxis    Closes throat  . Codeine Itching  . Menthol Hives  . Morphine And Related Itching  . Penicillins Swelling    Has patient had a PCN reaction causing immediate rash, facial/tongue/throat swelling, SOB or lightheadedness with hypotension: Yes Has patient had a PCN reaction causing severe rash involving mucus membranes or skin necrosis: No Has patient had a PCN reaction that required hospitalization No Has patient had a PCN reaction occurring within the last 10 years: No If all of the above answers are "NO", then may proceed with Cephalosporin use.  [place] [Pseudoephedrine Hcl] Swelling  . Azithromycin Rash  . Erythromycin Rash   Family History  Problem Relation Age of Onset  . Diabetes Mother   . Hypertension Mother   . Heart disease Mother   . Hyperlipidemia Mother   . Diabetes Father   . Hypertension Father   . Heart disease Father   . Hyperlipidemia Father   . Cervical cancer Sister   . Heart disease Sister   . Diabetes Sister   . Heart disease Sister   . Diabetes Sister     Current Outpatient Medications (Endocrine & Metabolic):  .  glipiZIDE (GLUCOTROL) 10 MG tablet, Take 1 tablet (10 mg total) by mouth daily before breakfast. .  metFORMIN (GLUCOPHAGE) 500 MG tablet, Take 1 tablet (500 mg total) by mouth 2 (two) times daily with a meal.  Current Outpatient Medications (Cardiovascular):  .  atorvastatin (LIPITOR) 40 MG tablet, TAKE 1 TABLET BY MOUTH ONCE DAILY AT  6  PM .  EPINEPHrine (EPIPEN JR) 0.15 MG/0.3ML injection, Inject 0.6 mLs (0.3 mg total) into the muscle once as needed for up to 1 dose for anaphylaxis. .  hydrochlorothiazide (HYDRODIURIL) 25 MG tablet, Take 1 tablet (25 mg total) by mouth daily. Marland Kitchen  losartan (COZAAR) 50 MG tablet, Take 1 tablet (50 mg total) by mouth daily.  Current Outpatient Medications (Respiratory):  .  montelukast (SINGULAIR) 10 MG tablet, Take 1 tablet (10 mg total) by mouth at bedtime.  Current Outpatient Medications (Analgesics):  .  acetaminophen (TYLENOL) 500 MG tablet, Take 1,000 mg by mouth every 6 (six) hours as needed for headache. .  meloxicam (MOBIC) 15 MG tablet, Take 1 tablet (15 mg total) by mouth daily. .  SUMAtriptan (IMITREX) 50 MG tablet, Take 1 tablet (50 mg total) by mouth once as needed for up to 1 dose for migraine. May repeat in 2 hours if headache persists or recurs.   Current Outpatient Medications (Other):  Marland Kitchen  buPROPion (WELLBUTRIN XL) 300 MG 24 hr tablet, Take 1 tablet (300 mg total) by mouth daily. Marland Kitchen  CINNAMON PO, Take 1 tablet by mouth daily. .   nitrofurantoin, macrocrystal-monohydrate, (MACROBID) 100 MG capsule, Take 1 capsule (100 mg total) by mouth 2 (two) times daily. .  ondansetron (ZOFRAN ODT) 4 MG disintegrating tablet, Take 1 tablet (4 mg total) by mouth every 8 (eight) hours as needed for nausea or vomiting. .  Vitamin D, Ergocalciferol, (DRISDOL) 1.25 MG (50000 UT) CAPS capsule, Take 1 capsule (50,000 Units total) by mouth every 7 (seven) days for 12 doses.   Reviewed prior external information including notes and imaging from  primary care provider As well as notes that were available from care everywhere and other healthcare systems.  Past medical history, social, surgical and family history all reviewed in electronic medical record.  No pertanent information unless stated regarding to the chief complaint.  Review of Systems:  No headache, visual changes, nausea, vomiting, diarrhea, constipation, dizziness, abdominal pain, skin rash, fevers, chills, night sweats, weight loss, swollen lymph nodes, body aches, joint swelling, chest pain, shortness of breath, mood changes. POSITIVE muscle aches  Objective  Blood pressure 132/90, pulse 97, height 5' (1.524 m), SpO2 98 %.   General: No apparent distress alert and oriented x3 mood and affect normal, dressed appropriately.  HEENT: Pupils equal, extraocular movements intact  Respiratory: Patient's speak in full sentences and does not appear short of breath  Cardiovascular: trace lower extremity edema, non tender, no erythema  Neuro: Cranial nerves II through XII are intact, neurovascularly intact in all extremities with 2+ DTRs and 2+ pulses.  Gait antalgic  MSK: Knee:bilateral  valgus deformity noted. Abnormal thigh to calf ratio.  Tender to palpation over medial and PF joint line.  ROM full in flexion and extension and lower leg rotation. instability with valgus force.  painful patellar compression. Patellar glide with moderate crepitus. Patellar and quadriceps tendons  unremarkable. Hamstring and quadriceps strength is normal.  After informed written and verbal consent, patient was seated on exam table. Right knee was prepped with alcohol swab and utilizing anterolateral approach, patient's right knee space was injected with 1:1:  marcaine 0.5%: Kenalog 40mg /dL: toradol 30 mg/mL . Patient tolerated the procedure well without immediate complications.  After informed written and verbal consent, patient was seated on exam table. Left knee was prepped with alcohol swab and utilizing anterolateral approach, patient's left knee space was injected with 1:1:  marcaine 0.5%: Kenalog 40mg /dL  1 cc of 30 mg/mL toradol. Patient tolerated the procedure well without immediate complications.   Impression and Recommendations:     This case required medical decision making of moderate complexity. The above documentation has been reviewed and is accurate and complete Lyndal Pulley, DO       Note: This dictation was prepared with Dragon dictation along with smaller phrase technology. Any transcriptional errors that result from this process are unintentional.

## 2019-04-18 NOTE — Patient Instructions (Addendum)
Good to see you Ice is your friend Bonne Dolores another mix of toradol and depomedrol to see if it helps even more You know the drill  Keep working hard See me again in 6-8 weeks

## 2019-04-28 ENCOUNTER — Other Ambulatory Visit: Payer: Self-pay | Admitting: Internal Medicine

## 2019-04-28 ENCOUNTER — Ambulatory Visit (INDEPENDENT_AMBULATORY_CARE_PROVIDER_SITE_OTHER): Payer: BC Managed Care – PPO | Admitting: Internal Medicine

## 2019-04-28 ENCOUNTER — Encounter: Payer: Self-pay | Admitting: Internal Medicine

## 2019-04-28 ENCOUNTER — Other Ambulatory Visit: Payer: Self-pay

## 2019-04-28 VITALS — BP 110/70 | HR 89 | Temp 98.0°F | Wt 254.8 lb

## 2019-04-28 DIAGNOSIS — E1169 Type 2 diabetes mellitus with other specified complication: Secondary | ICD-10-CM | POA: Diagnosis not present

## 2019-04-28 DIAGNOSIS — J302 Other seasonal allergic rhinitis: Secondary | ICD-10-CM

## 2019-04-28 DIAGNOSIS — E1165 Type 2 diabetes mellitus with hyperglycemia: Secondary | ICD-10-CM | POA: Diagnosis not present

## 2019-04-28 DIAGNOSIS — F4323 Adjustment disorder with mixed anxiety and depressed mood: Secondary | ICD-10-CM

## 2019-04-28 DIAGNOSIS — E559 Vitamin D deficiency, unspecified: Secondary | ICD-10-CM | POA: Diagnosis not present

## 2019-04-28 DIAGNOSIS — I10 Essential (primary) hypertension: Secondary | ICD-10-CM

## 2019-04-28 DIAGNOSIS — E785 Hyperlipidemia, unspecified: Secondary | ICD-10-CM

## 2019-04-28 LAB — POCT GLYCOSYLATED HEMOGLOBIN (HGB A1C): Hemoglobin A1C: 7.3 % — AB (ref 4.0–5.6)

## 2019-04-28 LAB — VITAMIN D 25 HYDROXY (VIT D DEFICIENCY, FRACTURES): Vit D, 25-Hydroxy: 37 ng/mL (ref 30–100)

## 2019-04-28 MED ORDER — ATORVASTATIN CALCIUM 40 MG PO TABS: ORAL_TABLET | ORAL | 1 refills | Status: AC

## 2019-04-28 MED ORDER — GLIPIZIDE 10 MG PO TABS: 10.0000 mg | ORAL_TABLET | Freq: Every day | ORAL | 1 refills | Status: DC

## 2019-04-28 MED ORDER — SUMATRIPTAN SUCCINATE 50 MG PO TABS: 50.0000 mg | ORAL_TABLET | Freq: Once | ORAL | 0 refills | Status: AC | PRN

## 2019-04-28 MED ORDER — HYDROCHLOROTHIAZIDE 25 MG PO TABS: 25.0000 mg | ORAL_TABLET | Freq: Every day | ORAL | 1 refills | Status: AC

## 2019-04-28 MED ORDER — METFORMIN HCL 1000 MG PO TABS: 1000.0000 mg | ORAL_TABLET | Freq: Two times a day (BID) | ORAL | 1 refills | Status: DC

## 2019-04-28 MED ORDER — LOSARTAN POTASSIUM 50 MG PO TABS: 50.0000 mg | ORAL_TABLET | Freq: Every day | ORAL | 1 refills | Status: AC

## 2019-04-28 MED ORDER — MONTELUKAST SODIUM 10 MG PO TABS: 10.0000 mg | ORAL_TABLET | Freq: Every day | ORAL | 1 refills | Status: AC

## 2019-04-28 MED ORDER — ONDANSETRON 4 MG PO TBDP: 4.0000 mg | ORAL_TABLET | Freq: Three times a day (TID) | ORAL | 0 refills | Status: AC | PRN

## 2019-04-28 MED ORDER — MELOXICAM 15 MG PO TABS: 15.0000 mg | ORAL_TABLET | Freq: Every day | ORAL | 1 refills | Status: DC

## 2019-04-28 MED ORDER — BUPROPION HCL ER (XL) 300 MG PO TB24: 300.0000 mg | ORAL_TABLET | Freq: Every day | ORAL | 1 refills | Status: DC

## 2019-04-28 NOTE — Progress Notes (Signed)
Established Patient Office Visit     This visit occurred during the SARS-CoV-2 public health emergency.  Safety protocols were in place, including screening questions prior to the visit, additional usage of staff PPE, and extensive cleaning of exam room while observing appropriate contact time as indicated for disinfecting solutions.    CC/Reason for Visit: 49-month follow-up chronic conditions  HPI: Arlett Maiorino is a 49 y.o. female who is coming in today for the above mentioned reasons. Past Medical History is significant for: Morbid obesity, bilateral knee osteoarthritis, well-controlled hypertension, type 2 diabetes that has been uncontrolled, vitamin D deficiency, depression/adjustment disorder on Wellbutrin, hyperlipidemia on statin.  She needs her vitamin D levels rechecked, she is due for recheck of her A1c.  At last visit glipizide was added to Metformin due to an A1c of 8.1.  She has been struggling emotionally.  She lost her father due to Covid in January, has not had a counseling session since before Christmas.   Past Medical/Surgical History: Past Medical History:  Diagnosis Date  . Arthritis    knees  . Depression   . Diabetes mellitus without complication ([place])   . Hypertension   . Migraines   . Ulcer     Past Surgical History:  Procedure Laterality Date  . CESAREAN SECTION  1993  . HYSTEROSCOPY WITH NOVASURE N/A 06/02/2016   Procedure: HYSTEROSCOPY WITH NOVASURE;  Surgeon: Paula Compton, MD;  Location: [facility];  Service: Gynecology;  Laterality: N/A;  . TUBAL LIGATION  1993    Social History:  reports that she has never smoked. She has never used smokeless tobacco. She reports current alcohol use. She reports that she does not use drugs.  Allergies: Allergies  Allergen Reactions  . Iodine Anaphylaxis  . Other Anaphylaxis    Hair dye  . Shellfish Allergy Anaphylaxis    Closes throat  . Codeine Itching  . Menthol Hives  . Morphine And Related  Itching  . Penicillins Swelling    Has patient had a PCN reaction causing immediate rash, facial/tongue/throat swelling, SOB or lightheadedness with hypotension: Yes Has patient had a PCN reaction causing severe rash involving mucus membranes or skin necrosis: No Has patient had a PCN reaction that required hospitalization No Has patient had a PCN reaction occurring within the last 10 years: No If all of the above answers are "NO", then may proceed with Cephalosporin use.  [place] [Pseudoephedrine Hcl] Swelling  . Azithromycin Rash  . Erythromycin Rash    Family History:  Family History  Problem Relation Age of Onset  . Diabetes Mother   . Hypertension Mother   . Heart disease Mother   . Hyperlipidemia Mother   . Diabetes Father   . Hypertension Father   . Heart disease Father   . Hyperlipidemia Father   . Cervical cancer Sister   . Heart disease Sister   . Diabetes Sister   . Heart disease Sister   . Diabetes Sister      Current Outpatient Medications:  .  acetaminophen (TYLENOL) 500 MG tablet, Take 1,000 mg by mouth every 6 (six) hours as needed for headache., Disp: , Rfl:  .  atorvastatin (LIPITOR) 40 MG tablet, TAKE 1 TABLET BY MOUTH ONCE DAILY AT  6  PM, Disp: 90 tablet, Rfl: 1 .  buPROPion (WELLBUTRIN XL) 300 MG 24 hr tablet, Take 1 tablet (300 mg total) by mouth daily., Disp: 90 tablet, Rfl: 1 .  CINNAMON PO, Take 1 tablet by mouth  daily., Disp: , Rfl:  .  EPINEPHrine (EPIPEN JR) 0.15 MG/0.3ML injection, Inject 0.6 mLs (0.3 mg total) into the muscle once as needed for up to 1 dose for anaphylaxis., Disp: 2 each, Rfl: 0 .  glipiZIDE (GLUCOTROL) 10 MG tablet, Take 1 tablet (10 mg total) by mouth daily before breakfast., Disp: 90 tablet, Rfl: 1 .  hydrochlorothiazide (HYDRODIURIL) 25 MG tablet, Take 1 tablet (25 mg total) by mouth daily., Disp: 90 tablet, Rfl: 1 .  losartan (COZAAR) 50 MG tablet, Take 1 tablet (50 mg total) by mouth daily., Disp: 90 tablet, Rfl: 1 .   meloxicam (MOBIC) 15 MG tablet, Take 1 tablet (15 mg total) by mouth daily., Disp: 90 tablet, Rfl: 1 .  metFORMIN (GLUCOPHAGE) 1000 MG tablet, Take 1 tablet (1,000 mg total) by mouth 2 (two) times daily with a meal., Disp: 180 tablet, Rfl: 1 .  montelukast (SINGULAIR) 10 MG tablet, Take 1 tablet (10 mg total) by mouth at bedtime., Disp: 90 tablet, Rfl: 1 .  ondansetron (ZOFRAN ODT) 4 MG disintegrating tablet, Take 1 tablet (4 mg total) by mouth every 8 (eight) hours as needed for nausea or vomiting., Disp: 20 tablet, Rfl: 0 .  SUMAtriptan (IMITREX) 50 MG tablet, Take 1 tablet (50 mg total) by mouth once as needed for up to 1 dose for migraine. May repeat in 2 hours if headache persists or recurs., Disp: 10 tablet, Rfl: 0  Review of Systems:  Constitutional: Denies fever, chills, diaphoresis, appetite change and fatigue.  HEENT: Denies photophobia, eye pain, redness, hearing loss, ear pain, congestion, sore throat, rhinorrhea, sneezing, mouth sores, trouble swallowing, neck pain, neck stiffness and tinnitus.   Respiratory: Denies SOB, DOE, cough, chest tightness,  and wheezing.   Cardiovascular: Denies chest pain, palpitations and leg swelling.  Gastrointestinal: Denies nausea, vomiting, abdominal pain, diarrhea, constipation, blood in stool and abdominal distention.  Genitourinary: Denies dysuria, urgency, frequency, hematuria, flank pain and difficulty urinating.  Endocrine: Denies: hot or cold intolerance, sweats, changes in hair or nails, polyuria, polydipsia. Musculoskeletal: Denies myalgias, back pain, joint swelling, arthralgias and gait problem.  Skin: Denies pallor, rash and wound.  Neurological: Denies dizziness, seizures, syncope, weakness, light-headedness, numbness and headaches.  Hematological: Denies adenopathy. Easy bruising, personal or family bleeding history  Psychiatric/Behavioral: Denies suicidal ideation, mood changes, confusion, nervousness, sleep disturbance and  agitation    Physical Exam: Vitals:   04/28/19 1508  BP: 110/70  Pulse: 89  Temp: 98 F (36.7 C)  TempSrc: Temporal  SpO2: 99%  Weight: 254 lb 12.8 oz (115.6 kg)    Body mass index is 49.76 kg/m.   Constitutional: NAD, calm, comfortable Eyes: PERRL, lids and conjunctivae normal, wears corrective lenses ENMT: Mucous membranes are moist.  Respiratory: clear to auscultation bilaterally, no wheezing, no crackles. Normal respiratory effort. No accessory muscle use.  Cardiovascular: Regular rate and rhythm, no murmurs / rubs / gallops. No extremity edema.   Neurologic: Grossly intact and nonfocal Psychiatric: Normal judgment and insight. Alert and oriented x 3. Normal mood.    Impression and Plan:  Uncontrolled type 2 diabetes mellitus with hyperglycemia ([place]) -A1c shows improvement today down to 7.3. -We will try and maximize Metformin dose, she is taking 1000 daily, will increase to thousand twice daily, she is concerned about potential for diarrhea.  Continue glipizide 10 mg daily.  Follow-up in 3 months.  Hyperlipidemia associated with type 2 diabetes mellitus ([place])  - Plan: atorvastatin (LIPITOR) 40 MG tablet -Last LDL was 88 in September  2020, less than 70.  Adjustment disorder with mixed anxiety and depressed mood  - Plan: buPROPion (WELLBUTRIN XL) 300 MG 24 hr tablet -Have advised that she resume her CBT sessions.  Essential hypertension  - Plan: hydrochlorothiazide (HYDRODIURIL) 25 MG tablet, losartan (COZAAR) 50 MG tablet -Well-controlled on today's visit.  Obesity, morbid ([employer]) -Discussed healthy lifestyle, including increased physical activity and better food choices to promote weight loss.  Vitamin D deficiency  - Plan: VITAMIN D 25 Hydroxy (Vit-D Deficiency, Fractures)    Patient Instructions  -Nice seeing you today!!  -Lab work today; will notify you once results are available.  -Increase metformin to 1000 mg twice daily.  -Schedule follow up in  3 months.     Lelon Frohlich, MD [facility]

## 2019-04-28 NOTE — Patient Instructions (Signed)
-  Nice seeing you today!!  -Lab work today; will notify you once results are available.  -Increase metformin to 1000 mg twice daily.  -Schedule follow up in 3 months.

## 2019-04-30 ENCOUNTER — Other Ambulatory Visit: Payer: Self-pay

## 2019-04-30 ENCOUNTER — Emergency Department (HOSPITAL_COMMUNITY)
Admission: EM | Admit: 2019-04-30 | Discharge: 2019-04-30 | Disposition: A | Discharge status: Home / Self Care | Payer: BC Managed Care – PPO | Attending: Emergency Medicine | Admitting: Emergency Medicine

## 2019-04-30 DIAGNOSIS — Z7984 Long term (current) use of oral hypoglycemic drugs: Secondary | ICD-10-CM | POA: Insufficient documentation

## 2019-04-30 DIAGNOSIS — E162 Hypoglycemia, unspecified: Secondary | ICD-10-CM

## 2019-04-30 DIAGNOSIS — Z79899 Other long term (current) drug therapy: Secondary | ICD-10-CM | POA: Diagnosis not present

## 2019-04-30 DIAGNOSIS — E11649 Type 2 diabetes mellitus with hypoglycemia without coma: Secondary | ICD-10-CM | POA: Insufficient documentation

## 2019-04-30 DIAGNOSIS — I10 Essential (primary) hypertension: Secondary | ICD-10-CM | POA: Diagnosis not present

## 2019-04-30 DIAGNOSIS — R739 Hyperglycemia, unspecified: Secondary | ICD-10-CM | POA: Diagnosis not present

## 2019-04-30 LAB — CBG MONITORING, ED
Glucose-Capillary: 105 mg/dL — ABNORMAL HIGH (ref 70–99)
Glucose-Capillary: 145 mg/dL — ABNORMAL HIGH (ref 70–99)

## 2019-04-30 NOTE — ED Notes (Signed)
Up metformin to 1000mg , has taken two doses thus far

## 2019-04-30 NOTE — Discharge Instructions (Signed)
Please go back to Metformin 500 mg twice daily rather than 1000 mg twice daily until he can be reevaluated by your primary care provider.  You need to be evaluated and have your antihyperglycemic regimen reassessed due to your hyperglycemia.  Be sure to continue checking blood sugars regularly.  Please return to the ED or seek immediate medical attention should you experience any new or worsening symptoms.

## 2019-04-30 NOTE — ED Triage Notes (Signed)
Arrived POV from home. Patient reports hypoglycemic episodes since her doctor changed her Metformin from 500 mg to 100 mg. Patient also reports abdominal pain, nausea, and glucose of 60. Patient ate chocolate and drank orange juice; CBG now 105.

## 2019-04-30 NOTE — ED Provider Notes (Signed)
[facility] DEPT Provider Note   CSN: [id number] Arrival date & time: 04/30/19  2009     History Chief Complaint  Patient presents with  . Hypoglycemia    Lynn Fields is a 49 y.o. female with PMH significant for type II DM, migraines, morbid obesity, HTN, and multiple abdominal surgeries who presents ED with complaints of hyperglycemia after primary care provider had increased her Metformin from 500 mg twice daily to 1000 mg twice daily.  I reviewed patient medical record and she was recently evaluated by Dr. Isaac Bliss, her new primary care provider.  Patient has bilateral knee osteoarthritis and has been receiving joint injections for her right knee pain.  These injections have subsequently caused concern for monitoring her hyperglycemia that was previously controlled with diet and exercise alone.  Her prior A1c had been 8.1 and glipizide was added to her Metformin 500 mg twice daily, however labs obtained few days ago demonstrated an improved A1c of 7.3.  Despite her improvement, her Metformin was doubled presumably an effort to maintain control of her high blood sugar in setting of frequent steroid injections for her osteoarthritis.  Today was the first day that she was on Metformin 1000 mg twice daily.  She felt a little bit weak after her first dose this morning and made sure that she ate foods that were rich in sugars including pancakes with maple syrup for breakfast.  She checked her blood sugar shortly thereafter however and it was only elevated to 115.  She continued to eat chocolates and drink orange juice throughout the day which caused her to feel nauseated have 3 episodes of nonbloody, nonbilious emesis.  However, despite her intake of sweets (which she states is unusual diet for her), her sugars were still below 100.  She almost did not take her second dose of Metformin, however her husband convinced her to continue with her medications, as prescribed.   Shortly thereafter, she felt particularly weak in her blood sugar was found to be 60 which prompted her to come to the ED for evaluation.  On my examination, she denies any abdominal pain, nausea, dizziness or lightheadedness, recent illness, fevers or chills, chest pain or difficulty breathing, urinary symptoms, or changes in bowel habits.  She feels almost entirely improved, but her hypoglycemia had her very frightened.  HPI     Past Medical History:  Diagnosis Date  . Arthritis    knees  . Depression   . Diabetes mellitus without complication ([place])   . Hypertension   . Migraines   . Ulcer     Patient Active Problem List   Diagnosis Date Noted  . Unilateral primary osteoarthritis, left knee 11/17/2018  . Unilateral primary osteoarthritis, right knee 11/17/2018  . Vitamin D deficiency 10/26/2018  . Fatty liver 10/05/2018  . Hot flashes 08/24/2018  . Poor appetite 07/27/2018  . Chronic headaches 07/27/2018  . Right hand pain 07/06/2018  . Hyperlipidemia associated with type 2 diabetes mellitus ([place]) 08/10/2017  . Allergic reaction to food 02/04/2017  . Adjustment disorder with mixed anxiety and depressed mood 08/07/2016  . Dysmenorrhea 04/15/2016  . Menorrhagia 04/15/2016  . Degenerative arthritis of knee, bilateral 02/11/2016  . Routine general medical examination at a health care facility 08/16/2015  . Essential hypertension 06/14/2012  . Uncontrolled diabetes mellitus ([place]) 06/14/2012  . Obesity, morbid ([place]) 06/14/2012    Past Surgical History:  Procedure Laterality Date  . CESAREAN SECTION  1993  . HYSTEROSCOPY WITH NOVASURE N/A  06/02/2016   Procedure: HYSTEROSCOPY WITH NOVASURE;  Surgeon: Paula Compton, MD;  Location: [facility];  Service: Gynecology;  Laterality: N/A;  . TUBAL LIGATION  1993     OB History   No obstetric history on file.     Family History  Problem Relation Age of Onset  . Diabetes Mother   . Hypertension Mother   . Heart disease Mother     . Hyperlipidemia Mother   . Diabetes Father   . Hypertension Father   . Heart disease Father   . Hyperlipidemia Father   . Cervical cancer Sister   . Heart disease Sister   . Diabetes Sister   . Heart disease Sister   . Diabetes Sister     Social History   Tobacco Use  . Smoking status: Never Smoker  . Smokeless tobacco: Never Used  Substance Use Topics  . Alcohol use: Yes    Comment: occ  . Drug use: No    Home Medications Prior to Admission medications   Medication Sig Start Date End Date Taking? Authorizing Provider  acetaminophen (TYLENOL) 500 MG tablet Take 1,000 mg by mouth every 6 (six) hours as needed for headache.    [provider]  atorvastatin (LIPITOR) 40 MG tablet TAKE 1 TABLET BY MOUTH ONCE DAILY AT  6  PM 04/28/19   Isaac Bliss, Rayford Halsted, MD  buPROPion (WELLBUTRIN XL) 300 MG 24 hr tablet Take 1 tablet (300 mg total) by mouth daily. 04/28/19   Isaac Bliss, Rayford Halsted, MD  CINNAMON PO Take 1 tablet by mouth daily.    [provider]  EPINEPHrine (EPIPEN JR) 0.15 MG/0.3ML injection Inject 0.6 mLs (0.3 mg total) into the muscle once as needed for up to 1 dose for anaphylaxis. 10/26/18   Isaac Bliss, Rayford Halsted, MD  glipiZIDE (GLUCOTROL) 10 MG tablet Take 1 tablet (10 mg total) by mouth daily before breakfast. 04/28/19   Isaac Bliss, Rayford Halsted, MD  hydrochlorothiazide (HYDRODIURIL) 25 MG tablet Take 1 tablet (25 mg total) by mouth daily. 04/28/19   Isaac Bliss, Rayford Halsted, MD  losartan (COZAAR) 50 MG tablet Take 1 tablet (50 mg total) by mouth daily. 04/28/19   Isaac Bliss, Rayford Halsted, MD  meloxicam (MOBIC) 15 MG tablet Take 1 tablet (15 mg total) by mouth daily. 04/28/19   Isaac Bliss, Rayford Halsted, MD  metFORMIN (GLUCOPHAGE) 1000 MG tablet Take 1 tablet (1,000 mg total) by mouth 2 (two) times daily with a meal. 04/28/19   Isaac Bliss, Rayford Halsted, MD  montelukast (SINGULAIR) 10 MG tablet Take 1 tablet (10 mg total) by mouth at bedtime.  04/28/19   Isaac Bliss, Rayford Halsted, MD  ondansetron (ZOFRAN ODT) 4 MG disintegrating tablet Take 1 tablet (4 mg total) by mouth every 8 (eight) hours as needed for nausea or vomiting. 04/28/19   Isaac Bliss, Rayford Halsted, MD  SUMAtriptan (IMITREX) 50 MG tablet Take 1 tablet (50 mg total) by mouth once as needed for up to 1 dose for migraine. May repeat in 2 hours if headache persists or recurs. 04/28/19   Isaac Bliss, Rayford Halsted, MD    Allergies    Iodine, Other, Shellfish allergy, Codeine, Menthol, Morphine and related, Penicillins, Sudafed [pseudoephedrine hcl], Azithromycin, and Erythromycin  Review of Systems   Review of Systems  All other systems reviewed and are negative.   Physical Exam Updated Vital Signs BP (!) 144/97   Pulse 77   Temp 98.7 F (37.1 C)   Resp 15  Ht 5' (1.524 m)   Wt 115.2 kg   SpO2 99%   BMI 49.61 kg/m   Physical Exam Vitals and nursing note reviewed. Exam conducted with a chaperone present.  Constitutional:      Appearance: Normal appearance.  HENT:     Head: Normocephalic and atraumatic.  Eyes:     General: No scleral icterus.    Conjunctiva/sclera: Conjunctivae normal.  Cardiovascular:     Rate and Rhythm: Normal rate and regular rhythm.     Pulses: Normal pulses.     Heart sounds: Normal heart sounds.  Pulmonary:     Effort: Pulmonary effort is normal. No respiratory distress.     Breath sounds: Normal breath sounds.  Abdominal:     General: Abdomen is flat. There is no distension.     Palpations: Abdomen is soft.     Tenderness: There is no abdominal tenderness. There is no guarding.  Musculoskeletal:     Right lower leg: No edema.     Left lower leg: No edema.  Skin:    General: Skin is dry.     Capillary Refill: Capillary refill takes less than 2 seconds.  Neurological:     Mental Status: She is alert and oriented to person, place, and time.     GCS: GCS eye subscore is 4. GCS verbal subscore is 5. GCS motor subscore is 6.    Psychiatric:        Mood and Affect: Mood normal.        Behavior: Behavior normal.        Thought Content: Thought content normal.     ED Results / Procedures / Treatments   Labs (all labs ordered are listed, but only abnormal results are displayed) Labs Reviewed  CBG MONITORING, ED - Abnormal; Notable for the following components:      Result Value   Glucose-Capillary 105 (*)    All other components within normal limits  CBG MONITORING, ED - Abnormal; Notable for the following components:   Glucose-Capillary 145 (*)    All other components within normal limits    EKG None  Radiology No results found.  Procedures Procedures (including critical care time)  Medications Ordered in ED Medications - No data to display  ED Course  I have reviewed the triage vital signs and the nursing notes.  Pertinent labs & imaging results that were available during my care of the patient were reviewed by me and considered in my medical decision making (see chart for details).    MDM Rules/Calculators/A&P                      Patient presents to the ED after having an episode of hypoglycemia.  Patient's POC CBG was initially 105 and then increased to 145 while here in the ED.  Her glipizide in conjunction with her recent increase Metformin may have contributed to her hypoglycemia.  Do not feel as though any laboratory work-up would yield any significant findings.  She is feeling back to baseline and is requesting discharge.  She declined food here in the ED and states that she simply wants to go home, eat, and get some sleep.  Instructed patient to go back to her original antihyperglycemic plan of Metformin 500 mg twice daily in conjunction with her glipizide.  Encouraging her to check her blood sugars regularly and to hold on her medication should her blood sugars be low.  She will need to follow-up with her  primary care provider regarding today's encounter and for medication adjustment.   Her episodes of nausea and vomiting were likely attributed to her eating nothing but maple syrup, chocolate, and orange juice and she is no longer nauseated on my examination.  Her physical exam was entirely benign.  I have low suspicion for an acute intra-abdominal pathology.  Strict ED return precautions discussed with the patient.  All of the evaluation and work-up results were discussed with the patient and any family at bedside. They were provided opportunity to ask any additional questions and have none at this time. They have expressed understanding of verbal discharge instructions as well as return precautions and are agreeable to the plan.    Final Clinical Impression(s) / ED Diagnoses Final diagnoses:  Hypoglycemia    Rx / DC Orders ED Discharge Orders    None       Corena Herter, PA-C 04/30/19 [place], Ankit, MD 05/01/19 [phone]

## 2019-05-02 ENCOUNTER — Ambulatory Visit: Payer: BC Managed Care – PPO | Admitting: Family Medicine

## 2019-05-02 ENCOUNTER — Encounter: Payer: Self-pay | Admitting: Internal Medicine

## 2019-05-03 ENCOUNTER — Other Ambulatory Visit: Payer: Self-pay | Admitting: *Deleted

## 2019-05-03 ENCOUNTER — Telehealth: Payer: Self-pay | Admitting: *Deleted

## 2019-05-03 ENCOUNTER — Other Ambulatory Visit: Payer: Self-pay | Admitting: Internal Medicine

## 2019-05-03 DIAGNOSIS — E559 Vitamin D deficiency, unspecified: Secondary | ICD-10-CM

## 2019-05-03 MED ORDER — VITAMIN D (ERGOCALCIFEROL) 1.25 MG (50000 UNIT) PO CAPS: 50000.0000 [IU] | ORAL_CAPSULE | ORAL | 0 refills | Status: AC

## 2019-05-03 NOTE — Telephone Encounter (Signed)
Spoke with patient and she states her glucose levels dropped to 58 and 60.

## 2019-05-04 NOTE — Telephone Encounter (Signed)
Did she take anything to raise her blood sugar? Does she have any symptoms currently? I know we dropped her metformin dose. Ask her to keep checking her BS and report back after 10 days.

## 2019-05-04 NOTE — Telephone Encounter (Signed)
Patient states she did eat and drink something to get her glucose levels up.  Patient will try to continue her Metformin.  She is keeping a log of her glucose levels and will send the numbers to [place] in 10 days.

## 2019-05-05 ENCOUNTER — Encounter: Payer: Self-pay | Admitting: Gastroenterology

## 2019-05-05 ENCOUNTER — Ambulatory Visit: Payer: BC Managed Care – PPO | Admitting: Gastroenterology

## 2019-05-05 VITALS — BP 110/70 | HR 80 | Temp 97.8°F | Ht 60.0 in | Wt 254.0 lb

## 2019-05-05 DIAGNOSIS — Z8371 Family history of colonic polyps: Secondary | ICD-10-CM

## 2019-05-05 DIAGNOSIS — Z800 Family history of malignant neoplasm of digestive organs: Secondary | ICD-10-CM

## 2019-05-05 MED ORDER — NA SULFATE-K SULFATE-MG SULF 17.5-3.13-1.6 GM/177ML PO SOLN: 1.0000 | ORAL | 0 refills | Status: AC

## 2019-05-05 NOTE — Progress Notes (Signed)
Referring Provider: Isaac Bliss, [facility]* Primary Care Physician:  Isaac Bliss, Rayford Halsted, MD  Reason for Consultation: Cancer screening   IMPRESSION:  No prior colon cancer screening Family history of colon polyps (father) Family history of colon cancer (maternal aunt in her 19s) BMI 49.2  Colonoscopy recommended for colon cancer screening.   PLAN: Colonoscopy  Please see the "Patient Instructions" section for addition details about the plan.  HPI: Lynn Fields is a 49 y.o. female referred by Dr. Isaac Bliss for colon cancer screening.  The patient wished to come in for consultation prior to her endoscopy.  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  Her father died of Covid in 2022-03-01. She has an identical twin. Her husband accompanies her to this appointment. She has had the Covid vaccine.   Labs from 10/26/2018 showing normal CBC with a hemoglobin of 14, MCV 85.6, RDW 13.4  There is no dysphagia, odynophagia, regurgitation,  heartburn, nausea, abdominal pain, change in bowel habits, melena, hematochezia, or bright red blood per rectum. There is no anorexia or recent change in weight.   Father with colon polyps. Paternal aunt with colon cancer in her 58s. Sisters with diverticulitis. No other known family history of colon cancer or polyps. No family history of uterine/endometrial cancer, pancreatic cancer or gastric/stomach cancer.   Past Medical History:  Diagnosis Date  . Arthritis    knees  . Depression   . Diabetes mellitus without complication ([place])   . Hypertension   . Migraines   . Ulcer     Past Surgical History:  Procedure Laterality Date  . CESAREAN SECTION  1993  . HYSTEROSCOPY WITH NOVASURE N/A 06/02/2016   Procedure: HYSTEROSCOPY WITH NOVASURE;  Surgeon: Paula Compton, MD;  Location: [facility];   Service: Gynecology;  Laterality: N/A;  . TUBAL LIGATION  1993    Current Outpatient Medications  Medication Sig Dispense Refill  . acetaminophen (TYLENOL) 500 MG tablet Take 1,000 mg by mouth every 6 (six) hours as needed for headache.    Marland Kitchen atorvastatin (LIPITOR) 40 MG tablet TAKE 1 TABLET BY MOUTH ONCE DAILY AT  6  PM 90 tablet 1  . buPROPion (WELLBUTRIN XL) 300 MG 24 hr tablet Take 1 tablet (300 mg total) by mouth daily. 90 tablet 1  . CINNAMON PO Take 1 tablet by mouth daily.    Marland Kitchen EPINEPHrine (EPIPEN JR) 0.15 MG/0.3ML injection Inject 0.6 mLs (0.3 mg total) into the muscle once as needed for up to 1 dose for anaphylaxis. 2 each 0  . glipiZIDE (GLUCOTROL) 10 MG tablet Take 1 tablet (10 mg total) by mouth daily before breakfast. 90 tablet 1  . hydrochlorothiazide (HYDRODIURIL) 25 MG tablet Take 1 tablet (25 mg total) by mouth daily. 90 tablet 1  . losartan (COZAAR) 50 MG tablet Take 1 tablet (50 mg total) by mouth daily. 90 tablet 1  . meloxicam (MOBIC) 15 MG tablet Take 1 tablet (15 mg total) by mouth daily. 90 tablet 1  . metFORMIN (GLUCOPHAGE) 1000 MG tablet Take 1 tablet (1,000 mg total) by mouth 2 (two) times daily with a meal. 180 tablet 1  . montelukast (SINGULAIR) 10 MG tablet Take 1 tablet (10 mg total) by mouth at bedtime. 90 tablet 1  . ondansetron (ZOFRAN ODT) 4 MG disintegrating tablet Take 1 tablet (4 mg total) by mouth every 8 (eight) hours as needed for nausea or vomiting. [place]  tablet 0  . SUMAtriptan (IMITREX) 50 MG tablet Take 1 tablet (50 mg total) by mouth once as needed for up to 1 dose for migraine. May repeat in 2 hours if headache persists or recurs. 10 tablet 0  . Vitamin D, Ergocalciferol, (DRISDOL) 1.25 MG (50000 UNIT) CAPS capsule Take 1 capsule (50,000 Units total) by mouth every 7 (seven) days for 12 doses. 12 capsule 0   No current facility-administered medications for this visit.    Allergies as of 05/05/2019 - Review Complete 04/30/2019  Allergen Reaction  Noted  . Iodine Anaphylaxis 04/10/2019  . Other Anaphylaxis 10/19/2017  . Shellfish allergy Anaphylaxis 05/23/2016  . Codeine Itching 12/11/2011  . Menthol Hives 05/23/2016  . Morphine and related Itching 12/11/2011  . Penicillins Swelling 12/11/2011  . Sudafed [pseudoephedrine hcl] Swelling 12/11/2011  . Azithromycin Rash 12/11/2011  . Erythromycin Rash 12/11/2011    Family History  Problem Relation Age of Onset  . Diabetes Mother   . Hypertension Mother   . Heart disease Mother   . Hyperlipidemia Mother   . Diabetes Father   . Hypertension Father   . Heart disease Father   . Hyperlipidemia Father   . Cervical cancer Sister   . Heart disease Sister   . Diabetes Sister   . Heart disease Sister   . Diabetes Sister     Social History   Socioeconomic History  . Marital status: Married    Spouse name: Not on file  . Number of children: Not on file  . Years of education: 33  . Highest education level: Not on file  Occupational History  . Occupation: Product manager: [employer]  Tobacco Use  . Smoking status: Never Smoker  . Smokeless tobacco: Never Used  Substance and Sexual Activity  . Alcohol use: Yes    Comment: occ  . Drug use: No  . Sexual activity: Yes    Birth control/protection: Surgical  Other Topics Concern  . Not on file  Social History Narrative   Regular exercise-yes   Caffeine Use-no   Social Determinants of Health   Financial Resource Strain:   . Difficulty of Paying Living Expenses:   Food Insecurity:   . Worried About Charity fundraiser in the Last Year:   . Arboriculturist in the Last Year:   Transportation Needs:   . Film/video editor (Medical):   Marland Kitchen Lack of Transportation (Non-Medical):   Physical Activity:   . Days of Exercise per Week:   . Minutes of Exercise per Session:   Stress:   . Feeling of Stress :   Social Connections:   . Frequency of Communication with Friends and Family:   . Frequency of Social  Gatherings with Friends and Family:   . Attends Religious Services:   . Active Member of Clubs or Organizations:   . Attends Archivist Meetings:   Marland Kitchen Marital Status:   Intimate Partner Violence:   . Fear of Current or Ex-Partner:   . Emotionally Abused:   Marland Kitchen Physically Abused:   . Sexually Abused:     Review of Systems: 12 system ROS is negative except as noted above with the addition of anxiety, arthritis, depression, headaches, night sweats.   Physical Exam: General:   Alert,  well-nourished, pleasant and cooperative in NAD Head:  Normocephalic and atraumatic. Eyes:  Sclera clear, no icterus.   Conjunctiva pink. Ears:  Normal auditory acuity. Nose:  No deformity, discharge,  or lesions. Mouth:  No deformity or lesions.   Neck:  Supple; no masses or thyromegaly. Lungs:  Clear throughout to auscultation.   No wheezes. Heart:  Regular rate and rhythm; no murmurs. Abdomen:  Soft, central obesity, nontender, nondistended, normal bowel sounds, no rebound or guarding. No hepatosplenomegaly.   Rectal:  Deferred  Msk:  Symmetrical. No boney deformities LAD: No inguinal or umbilical LAD Extremities:  No clubbing or edema. Neurologic:  Alert and  oriented x4;  grossly nonfocal Skin:  Intact without significant lesions or rashes. Psych:  Alert and cooperative. Normal mood and affect.    Yer Castello L. Tarri Glenn, MD, MPH 05/05/2019, 10:06 AM

## 2019-05-05 NOTE — Patient Instructions (Signed)
You have been scheduled for a colonoscopy. Please follow written instructions given to you at your visit today.  Please pick up your prep supplies at the pharmacy within the next 1-3 days. If you use inhalers (even only as needed), please bring them with you on the day of your procedure.  I value your feedback and thank you for entrusting [place] with your care. If you get a  patient survey, I would appreciate you taking the time to let us know about your experience today. Thank you!   Due to recent changes in healthcare laws, you may see the results of your imaging and laboratory studies on MyChart before your provider has had a chance to review them.  We understand that in some cases there may be results that are confusing or concerning to you. Not all laboratory results come back in the same time frame and the provider may be waiting for multiple results in order to interpret others.  Please give [place] 48 hours in order for your provider to thoroughly review all the results before contacting the office for clarification of your results.

## 2019-05-19 ENCOUNTER — Ambulatory Visit (INDEPENDENT_AMBULATORY_CARE_PROVIDER_SITE_OTHER): Payer: BC Managed Care – PPO

## 2019-05-19 ENCOUNTER — Ambulatory Visit
Admission: EM | Admit: 2019-05-19 | Discharge: 2019-05-19 | Disposition: A | Discharge status: Home / Self Care | Payer: BC Managed Care – PPO | Attending: Emergency Medicine | Admitting: Emergency Medicine

## 2019-05-19 DIAGNOSIS — M79631 Pain in right forearm: Secondary | ICD-10-CM

## 2019-05-19 DIAGNOSIS — S59911A Unspecified injury of right forearm, initial encounter: Secondary | ICD-10-CM | POA: Diagnosis not present

## 2019-05-19 MED ORDER — NAPROXEN 500 MG PO TABS: 500.0000 mg | ORAL_TABLET | Freq: Two times a day (BID) | ORAL | 0 refills | Status: DC

## 2019-05-19 MED ORDER — CYCLOBENZAPRINE HCL 5 MG PO TABS: 5.0000 mg | ORAL_TABLET | Freq: Two times a day (BID) | ORAL | 0 refills | Status: AC | PRN

## 2019-05-19 NOTE — Discharge Instructions (Addendum)
Recommend RICE: rest, ice, compression, elevation as needed for pain.    Heat therapy (hot compress, warm wash rag, hot showers, etc.) can help relax muscles and soothe muscle aches. Cold therapy (ice packs) can be used to help swelling both after injury and after prolonged use of areas of chronic pain/aches.  For pain: naproxen twice daily with food  May take muscle relaxer as needed for severe pain / spasm.  (This medication may cause you to become tired so it is important you do not drink alcohol or operate heavy machinery while on this medication.  Recommend your first dose to be taken before bedtime to monitor for side effects safely)
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
## 2019-05-20 ENCOUNTER — Encounter: Payer: Self-pay | Admitting: Family Medicine

## 2019-05-23 ENCOUNTER — Other Ambulatory Visit: Payer: Self-pay

## 2019-05-23 ENCOUNTER — Encounter: Payer: Self-pay | Admitting: Family Medicine

## 2019-05-23 ENCOUNTER — Ambulatory Visit: Payer: BC Managed Care – PPO | Admitting: Family Medicine

## 2019-05-23 DIAGNOSIS — M170 Bilateral primary osteoarthritis of knee: Secondary | ICD-10-CM | POA: Diagnosis not present

## 2019-05-23 NOTE — Assessment & Plan Note (Signed)
Patient likely has some exacerbation from previous exam but I do think that patient may have extended the knee because some mild subluxation of the patella.  Due to patient's body habitus patient would not be able to wear a knee immobilizer and I believe would actually increase the arthritic pain.  Discussed icing regimen and home exercise, which activities to doing which wants to avoid.  Patient can increase activity slowly over the course the next several weeks.  Follow-up with me again in 4 to 8 weeks total time with patient 30 minutes

## 2019-05-23 NOTE — Patient Instructions (Signed)
Good to see you  Ice 20 minutes 2 times daily. Usually after activity and before bed. pennsaid pinkie amount topically 2 times daily as needed.  Or look at arnica lotion  Try to tape the knee as I show you daily for next week at least Keep your other appointment and you know where I am if you need me
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
## 2019-06-02 ENCOUNTER — Encounter: Payer: Self-pay | Admitting: Gastroenterology

## 2019-06-07 ENCOUNTER — Telehealth: Payer: Self-pay

## 2019-06-07 NOTE — Telephone Encounter (Signed)
Pt was scheduled a colonoscopy at [facility] on 06/10/19. That appt was cancelled and pt was then scheduled for a colonoscopy at [facility] for 06/20/19.  The appt at [facility] was cancelled and pt was rescheduled to 06/09/19 @ [facility].  Pt's current BMI is 50.19 at a recent office visit @ [facility] w/ Dr. Tamala Julian on 05/23/19 .  [facility]'s policy states that pt's procedure should be done at the hospital if BMI > 50.  Please advise.

## 2019-06-07 NOTE — Telephone Encounter (Signed)
Reviewed with Osvaldo Angst who reviewed the patient's chart. He does not feel it is safe to proceed with her procedure at the [facility]. Please schedule at the hospital. Thank you.

## 2019-06-07 NOTE — Telephone Encounter (Signed)
Spoke with pt and she thinks she has lost wt and will try and come in for a wt check to see if procedure can still be done in the [place].

## 2019-06-07 NOTE — Telephone Encounter (Signed)
Procedure needs to be scheduled at [facility]. Thank you.

## 2019-06-07 NOTE — Telephone Encounter (Signed)
Spoke with pt and she is aware. Will call pt back to schedule procedure at the hospital.

## 2019-06-09 ENCOUNTER — Encounter: Payer: BC Managed Care – PPO | Admitting: Gastroenterology

## 2019-06-09 ENCOUNTER — Encounter: Payer: Self-pay | Admitting: Family Medicine

## 2019-06-09 ENCOUNTER — Other Ambulatory Visit: Payer: Self-pay

## 2019-06-09 DIAGNOSIS — Z800 Family history of malignant neoplasm of digestive organs: Secondary | ICD-10-CM

## 2019-06-09 NOTE — Telephone Encounter (Signed)
Pt scheduled for previsit 7/1@8am , covid screen at [facility] 7/8@10am , colon at [facility] 7/12@10 :45am. Pt to arrive at [facility] @9 :15am. Pt aware of appts.

## 2019-06-10 ENCOUNTER — Ambulatory Visit: Payer: BC Managed Care – PPO | Admitting: Family Medicine

## 2019-06-10 ENCOUNTER — Encounter: Payer: Self-pay | Admitting: Family Medicine

## 2019-06-10 ENCOUNTER — Other Ambulatory Visit: Payer: Self-pay

## 2019-06-10 ENCOUNTER — Encounter: Payer: BC Managed Care – PPO | Admitting: Gastroenterology

## 2019-06-10 ENCOUNTER — Ambulatory Visit: Payer: Self-pay

## 2019-06-10 VITALS — BP 128/94 | HR 77 | Ht 60.0 in | Wt 257.0 lb

## 2019-06-10 DIAGNOSIS — M170 Bilateral primary osteoarthritis of knee: Secondary | ICD-10-CM

## 2019-06-10 DIAGNOSIS — M79661 Pain in right lower leg: Secondary | ICD-10-CM | POA: Diagnosis not present

## 2019-06-10 MED ORDER — TIZANIDINE HCL 4 MG PO TABS
4.0000 mg | ORAL_TABLET | Freq: Every day | ORAL | 0 refills | Status: AC
Start: 2019-06-10 — End: ?

## 2019-06-10 MED ORDER — KETOROLAC TROMETHAMINE 30 MG/ML IJ SOLN
30.0000 mg | Freq: Once | INTRAMUSCULAR | Status: AC
  Administered 2019-06-10: 30 mg via INTRAMUSCULAR

## 2019-06-10 NOTE — Progress Notes (Signed)
[place] Phone: [phone] Subjective:   Lynn Fields, am serving as a scribe for Dr. Hulan Saas. This visit occurred during the SARS-CoV-2 public health emergency.  Safety protocols were in place, including screening questions prior to the visit, additional usage of staff PPE, and extensive cleaning of exam room while observing appropriate contact time as indicated for disinfecting solutions.   I'm seeing this patient by the request  of:  Isaac Bliss, Rayford Halsted, MD  CC: Bilateral knee pain and worsening calf pain  [id number]   05/23/2019 Patient likely has some exacerbation from previous exam but I do think that patient may have extended the knee because some mild subluxation of the patella.  Due to patient's body habitus patient would not be able to wear a knee immobilizer and I believe would actually increase the arthritic pain.  Discussed icing regimen and home exercise, which activities to doing which wants to avoid.  Patient can increase activity slowly over the course the next several weeks.  Follow-up with me again in 4 to 8 weeks total time with patient 30 minutes  Update 06/10/2019 Lynn Fields is a 49 y.o. female coming in with complaint of bilateral knee pain. Patient states that for past 2 weeks she has been having right calf pain. Patient has been massaging area. Patient has been getting bruises on her skin for the past month. Chronic, constant knee pain.        Past Medical History:  Diagnosis Date  . Arthritis    knees  . Depression   . Diabetes mellitus without complication ([place])   . Hypertension   . Migraines   . Ulcer    Past Surgical History:  Procedure Laterality Date  . CESAREAN SECTION  1993  . HYSTEROSCOPY WITH NOVASURE N/A 06/02/2016   Procedure: HYSTEROSCOPY WITH NOVASURE;  Surgeon: Paula Compton, MD;  Location: [facility];  Service: Gynecology;  Laterality: N/A;  .  TUBAL LIGATION  1993   Social History   Socioeconomic History  . Marital status: Married    Spouse name: Not on file  . Number of children: Not on file  . Years of education: 72  . Highest education level: Not on file  Occupational History  . Occupation: Product manager: [employer]  Tobacco Use  . Smoking status: Never Smoker  . Smokeless tobacco: Never Used  Substance and Sexual Activity  . Alcohol use: Yes    Comment: occ  . Drug use: Fields  . Sexual activity: Yes    Birth control/protection: Surgical  Other Topics Concern  . Not on file  Social History Narrative   Regular exercise-yes   Caffeine Use-Fields   Social Determinants of Health   Financial Resource Strain:   . Difficulty of Paying Living Expenses:   Food Insecurity:   . Worried About Charity fundraiser in the Last Year:   . Arboriculturist in the Last Year:   Transportation Needs:   . Film/video editor (Medical):   Marland Kitchen Lack of Transportation (Non-Medical):   Physical Activity:   . Days of Exercise per Week:   . Minutes of Exercise per Session:   Stress:   . Feeling of Stress :   Social Connections:   . Frequency of Communication with Friends and Family:   . Frequency of Social Gatherings with Friends and Family:   . Attends Religious Services:   .  Active Member of Clubs or Organizations:   . Attends Archivist Meetings:   Marland Kitchen Marital Status:    Allergies  Allergen Reactions  . Iodine Anaphylaxis  . Other Anaphylaxis    Hair dye  . Shellfish Allergy Anaphylaxis    Closes throat  . Codeine Itching  . Menthol Hives  . Morphine And Related Itching  . Penicillins Swelling    Has patient had a PCN reaction causing immediate rash, facial/tongue/throat swelling, SOB or lightheadedness with hypotension: Yes Has patient had a PCN reaction causing severe rash involving mucus membranes or skin necrosis: Fields Has patient had a PCN reaction that required hospitalization Fields Has  patient had a PCN reaction occurring within the last 10 years: Fields If all of the above answers are "Fields", then may proceed with Cephalosporin use.  Ebbie Ridge [Pseudoephedrine Hcl] Swelling  . Azithromycin Rash  . Erythromycin Rash   Family History  Problem Relation Age of Onset  . Diabetes Mother   . Hypertension Mother   . Heart disease Mother   . Hyperlipidemia Mother   . Diabetes Father   . Hypertension Father   . Heart disease Father   . Hyperlipidemia Father   . Cervical cancer Sister   . Heart disease Sister   . Diabetes Sister   . Heart disease Sister   . Diabetes Sister     Current Outpatient Medications (Endocrine & Metabolic):  .  glipiZIDE (GLUCOTROL) 10 MG tablet, Take 1 tablet (10 mg total) by mouth daily before breakfast. .  metFORMIN (GLUCOPHAGE) 1000 MG tablet, Take 1 tablet (1,000 mg total) by mouth 2 (two) times daily with a meal.  Current Outpatient Medications (Cardiovascular):  .  atorvastatin (LIPITOR) 40 MG tablet, TAKE 1 TABLET BY MOUTH ONCE DAILY AT  6  PM .  EPINEPHrine (EPIPEN JR) 0.15 MG/0.3ML injection, Inject 0.6 mLs (0.3 mg total) into the muscle once as needed for up to 1 dose for anaphylaxis. .  hydrochlorothiazide (HYDRODIURIL) 25 MG tablet, Take 1 tablet (25 mg total) by mouth daily. Marland Kitchen  losartan (COZAAR) 50 MG tablet, Take 1 tablet (50 mg total) by mouth daily.  Current Outpatient Medications (Respiratory):  .  montelukast (SINGULAIR) 10 MG tablet, Take 1 tablet (10 mg total) by mouth at bedtime.  Current Outpatient Medications (Analgesics):  .  acetaminophen (TYLENOL) 500 MG tablet, Take 1,000 mg by mouth every 6 (six) hours as needed for headache. .  meloxicam (MOBIC) 15 MG tablet, Take 1 tablet (15 mg total) by mouth daily. .  naproxen (NAPROSYN) 500 MG tablet, Take 1 tablet (500 mg total) by mouth 2 (two) times daily. .  SUMAtriptan (IMITREX) 50 MG tablet, Take 1 tablet (50 mg total) by mouth once as needed for up to 1 dose for migraine.  May repeat in 2 hours if headache persists or recurs.   Current Outpatient Medications (Other):  Marland Kitchen  buPROPion (WELLBUTRIN XL) 300 MG 24 hr tablet, Take 1 tablet (300 mg total) by mouth daily. Marland Kitchen  CINNAMON PO, Take 1 tablet by mouth daily. .  ondansetron (ZOFRAN ODT) 4 MG disintegrating tablet, Take 1 tablet (4 mg total) by mouth every 8 (eight) hours as needed for nausea or vomiting. .  Vitamin D, Ergocalciferol, (DRISDOL) 1.25 MG (50000 UNIT) CAPS capsule, Take 1 capsule (50,000 Units total) by mouth every 7 (seven) days for 12 doses.   Reviewed prior external information including notes and imaging from  primary care provider As well as notes that were  available from care everywhere and other healthcare systems.  Past medical history, social, surgical and family history all reviewed in electronic medical record.  Fields pertanent information unless stated regarding to the chief complaint.   Review of Systems:  Fields headache, visual changes, nausea, vomiting, diarrhea, constipation, dizziness, abdominal pain, skin rash, fevers, chills, night sweats, weight loss, swollen lymph nodes,  chest pain, shortness of breath, mood changes. POSITIVE muscle aches, body aches, joint swelling  Objective  There were Fields vitals taken for this visit.   General: Fields apparent distress alert and oriented x3 mood and affect normal, dressed appropriately.  Morbidly obese HEENT: Pupils equal, extraocular movements intact  Respiratory: Patient's speak in full sentences and does not appear short of breath  Cardiovascular: 2+ lower extremity edema, non tender, Fields erythema  Neuro: Cranial nerves II through XII are intact, neurovascularly intact in all extremities with 2+ DTRs and 2+ pulses.  Gait severely antalgic and worsening MSK:   Knee: Bilateral valgus deformity noted. Large thigh to calf ratio.  Tender to palpation over medial and PF joint line.  Patient has severe tenderness to compression of the leg calf. ROM  full in flexion and extension and lower leg rotation. instability with valgus force.  painful patellar compression. Patellar glide with moderate crepitus. Patellar and quadriceps tendons unremarkable. Hamstring and quadriceps strength is normal.  After informed written and verbal consent, patient was seated on exam table. Right knee was prepped with alcohol swab and utilizing anterolateral approach, patient's right knee space was injected with 4:1  marcaine 0.5%: Kenalog 40mg /dL. Patient tolerated the procedure well without immediate complications.  After informed written and verbal consent, patient was seated on exam table. Left knee was prepped with alcohol swab and utilizing anterolateral approach, patient's left knee space was injected with 4:1  marcaine 0.5%: Kenalog 40mg /dL. Patient tolerated the procedure well without immediate complications.  Limited musculoskeletal ultrasound was performed and interpreted by Lyndal Pulley  Limited ultrasound of patient's calf is limited secondary to patient habitus.  Difficult to assess patient's popliteal artery secondary to her size with the ultrasound equipment we have here.    Impression and Recommendations:     This case required medical decision making of moderate complexity. The above documentation has been reviewed and is accurate and complete Lyndal Pulley, DO       Note: This dictation was prepared with Dragon dictation along with smaller phrase technology. Any transcriptional errors that result from this process are unintentional.

## 2019-06-10 NOTE — Patient Instructions (Addendum)
[facility] [address], [place], [place] [address] Arrive at 3:45p, Appt at 4:00pm  Aspirin daily until doppler Zanaflex at night Injected knees today See me in 6-8 weeks

## 2019-06-10 NOTE — Assessment & Plan Note (Signed)
Bilateral injections once again given today.  Worsening pain.  Could be contributing to some of the leg pain.  I am concerned though with patient's family history of a potential DVT.  Patient has taken an aspirin and encouraged her to continue to do so until she does have a Doppler done.  Follow-up again 6 to 8 weeks otherwise

## 2019-06-13 ENCOUNTER — Other Ambulatory Visit: Payer: Self-pay

## 2019-06-13 ENCOUNTER — Ambulatory Visit (HOSPITAL_COMMUNITY)
Admission: RE | Admit: 2019-06-13 | Discharge: 2019-06-13 | Disposition: A | Discharge status: Home / Self Care | Payer: BC Managed Care – PPO | Source: Ambulatory Visit | Attending: Cardiovascular Disease | Admitting: Cardiovascular Disease

## 2019-06-13 ENCOUNTER — Encounter: Payer: Self-pay | Admitting: Family Medicine

## 2019-06-13 DIAGNOSIS — M79661 Pain in right lower leg: Secondary | ICD-10-CM | POA: Insufficient documentation

## 2019-06-20 ENCOUNTER — Ambulatory Visit (HOSPITAL_COMMUNITY): Admit: 2019-06-20 | Payer: BC Managed Care – PPO | Admitting: Gastroenterology

## 2019-06-20 ENCOUNTER — Encounter (HOSPITAL_COMMUNITY): Payer: Self-pay

## 2019-06-20 SURGERY — COLONOSCOPY WITH PROPOFOL
Anesthesia: Monitor Anesthesia Care

## 2019-07-05 ENCOUNTER — Other Ambulatory Visit: Payer: Self-pay

## 2019-07-06 ENCOUNTER — Encounter: Payer: Self-pay | Admitting: Internal Medicine

## 2019-07-06 ENCOUNTER — Other Ambulatory Visit (INDEPENDENT_AMBULATORY_CARE_PROVIDER_SITE_OTHER): Payer: BC Managed Care – PPO

## 2019-07-06 ENCOUNTER — Ambulatory Visit (INDEPENDENT_AMBULATORY_CARE_PROVIDER_SITE_OTHER): Payer: BC Managed Care – PPO | Admitting: Internal Medicine

## 2019-07-06 VITALS — BP 120/82 | HR 99 | Temp 97.7°F | Ht 60.0 in | Wt 261.0 lb

## 2019-07-06 DIAGNOSIS — T148XXA Other injury of unspecified body region, initial encounter: Secondary | ICD-10-CM | POA: Diagnosis not present

## 2019-07-06 DIAGNOSIS — E11649 Type 2 diabetes mellitus with hypoglycemia without coma: Secondary | ICD-10-CM | POA: Diagnosis not present

## 2019-07-06 DIAGNOSIS — E162 Hypoglycemia, unspecified: Secondary | ICD-10-CM

## 2019-07-06 LAB — CBC WITH DIFFERENTIAL/PLATELET
Basophils Absolute: 0.1 10*3/uL (ref 0.0–0.1)
Basophils Relative: 0.9 % (ref 0.0–3.0)
Eosinophils Absolute: 0.2 10*3/uL (ref 0.0–0.7)
Eosinophils Relative: 2 % (ref 0.0–5.0)
HCT: 40.1 % (ref 36.0–46.0)
Hemoglobin: 13.7 g/dL (ref 12.0–15.0)
Lymphocytes Relative: 34.7 % (ref 12.0–46.0)
Lymphs Abs: 2.7 10*3/uL (ref 0.7–4.0)
MCHC: 34.1 g/dL (ref 30.0–36.0)
MCV: 87 fl (ref 78.0–100.0)
Monocytes Absolute: 0.5 10*3/uL (ref 0.1–1.0)
Monocytes Relative: 6.7 % (ref 3.0–12.0)
Neutro Abs: 4.3 10*3/uL (ref 1.4–7.7)
Neutrophils Relative %: 55.7 % (ref 43.0–77.0)
Platelets: 312 10*3/uL (ref 150.0–400.0)
RBC: 4.61 Mil/uL (ref 3.87–5.11)
RDW: 14 % (ref 11.5–15.5)
WBC: 7.8 10*3/uL (ref 4.0–10.5)

## 2019-07-06 LAB — PROTIME-INR
INR: 0.9 ratio (ref 0.8–1.0)
Prothrombin Time: 9.8 s (ref 9.6–13.1)

## 2019-07-06 LAB — APTT: aPTT: 27.8 s (ref 23.4–32.7)

## 2019-07-06 LAB — GLUCOSE, POCT (MANUAL RESULT ENTRY): POC Glucose: 70 mg/dl (ref 70–99)

## 2019-07-06 NOTE — Progress Notes (Signed)
Acute office Visit     This visit occurred during the SARS-CoV-2 public health emergency.  Safety protocols were in place, including screening questions prior to the visit, additional usage of staff PPE, and extensive cleaning of exam room while observing appropriate contact time as indicated for disinfecting solutions.    CC/Reason for Visit: Low blood sugar  HPI: Lynn Fields is a 49 y.o. female who is coming in today for the above mentioned reasons.  She has type 2 diabetes and is on Metformin 1000 mg twice daily as well as glipizide 10 mg daily.  Her most recent A1c was 7.3 on April 1.  She scheduled this visit because she has been having frequent episodes of hypoglycemia in the 50s and 60s.  She gets the shakes, becomes diaphoretic, lightheaded, nauseous and feels like she is in a mental fog.  It has gotten to the point to where she carries snacks and glucose tablets at work at home and in her car.  She has continued to take her medications as prescribed.  She is also concerned about frequent bruising.  She will wake up with bruises but does not recall any trauma.  She did take aspirin for 2 days about 3 weeks ago but stopped taking it.   Past Medical/Surgical History: Past Medical History:  Diagnosis Date  . Arthritis    knees  . Depression   . Diabetes mellitus without complication ([place])   . Hypertension   . Migraines   . Ulcer     Past Surgical History:  Procedure Laterality Date  . CESAREAN SECTION  1993  . HYSTEROSCOPY WITH NOVASURE N/A 06/02/2016   Procedure: HYSTEROSCOPY WITH NOVASURE;  Surgeon: Paula Compton, MD;  Location: [facility];  Service: Gynecology;  Laterality: N/A;  . TUBAL LIGATION  1993    Social History:  reports that she has never smoked. She has never used smokeless tobacco. She reports current alcohol use. She reports that she does not use drugs.  Allergies: Allergies  Allergen Reactions  . Iodine Anaphylaxis  . Other Anaphylaxis    Hair  dye  . Shellfish Allergy Anaphylaxis    Closes throat  . Codeine Itching  . Menthol Hives  . Morphine And Related Itching  . Penicillins Swelling    Has patient had a PCN reaction causing immediate rash, facial/tongue/throat swelling, SOB or lightheadedness with hypotension: Yes Has patient had a PCN reaction causing severe rash involving mucus membranes or skin necrosis: No Has patient had a PCN reaction that required hospitalization No Has patient had a PCN reaction occurring within the last 10 years: No If all of the above answers are "NO", then may proceed with Cephalosporin use.  [place] [Pseudoephedrine Hcl] Swelling  . Azithromycin Rash  . Erythromycin Rash    Family History:  Family History  Problem Relation Age of Onset  . Diabetes Mother   . Hypertension Mother   . Heart disease Mother   . Hyperlipidemia Mother   . Diabetes Father   . Hypertension Father   . Heart disease Father   . Hyperlipidemia Father   . Cervical cancer Sister   . Heart disease Sister   . Diabetes Sister   . Heart disease Sister   . Diabetes Sister      Current Outpatient Medications:  .  acetaminophen (TYLENOL) 500 MG tablet, Take 1,000 mg by mouth every 6 (six) hours as needed for headache., Disp: , Rfl:  .  atorvastatin (LIPITOR) 40 MG tablet,  TAKE 1 TABLET BY MOUTH ONCE DAILY AT  6  PM, Disp: 90 tablet, Rfl: 1 .  buPROPion (WELLBUTRIN XL) 300 MG 24 hr tablet, Take 1 tablet (300 mg total) by mouth daily., Disp: 90 tablet, Rfl: 1 .  CINNAMON PO, Take 1 tablet by mouth daily., Disp: , Rfl:  .  EPINEPHrine (EPIPEN JR) 0.15 MG/0.3ML injection, Inject 0.6 mLs (0.3 mg total) into the muscle once as needed for up to 1 dose for anaphylaxis., Disp: 2 each, Rfl: 0 .  hydrochlorothiazide (HYDRODIURIL) 25 MG tablet, Take 1 tablet (25 mg total) by mouth daily., Disp: 90 tablet, Rfl: 1 .  losartan (COZAAR) 50 MG tablet, Take 1 tablet (50 mg total) by mouth daily., Disp: 90 tablet, Rfl: 1 .  meloxicam  (MOBIC) 15 MG tablet, Take 1 tablet (15 mg total) by mouth daily., Disp: 90 tablet, Rfl: 1 .  metFORMIN (GLUCOPHAGE) 1000 MG tablet, Take 1 tablet (1,000 mg total) by mouth 2 (two) times daily with a meal., Disp: 180 tablet, Rfl: 1 .  montelukast (SINGULAIR) 10 MG tablet, Take 1 tablet (10 mg total) by mouth at bedtime., Disp: 90 tablet, Rfl: 1 .  naproxen (NAPROSYN) 500 MG tablet, Take 1 tablet (500 mg total) by mouth 2 (two) times daily., Disp: 30 tablet, Rfl: 0 .  ondansetron (ZOFRAN ODT) 4 MG disintegrating tablet, Take 1 tablet (4 mg total) by mouth every 8 (eight) hours as needed for nausea or vomiting., Disp: 20 tablet, Rfl: 0 .  SUMAtriptan (IMITREX) 50 MG tablet, Take 1 tablet (50 mg total) by mouth once as needed for up to 1 dose for migraine. May repeat in 2 hours if headache persists or recurs., Disp: 10 tablet, Rfl: 0 .  tiZANidine (ZANAFLEX) 4 MG tablet, Take 1 tablet (4 mg total) by mouth at bedtime., Disp: 30 tablet, Rfl: 0 .  Vitamin D, Ergocalciferol, (DRISDOL) 1.25 MG (50000 UNIT) CAPS capsule, Take 1 capsule (50,000 Units total) by mouth every 7 (seven) days for 12 doses., Disp: 12 capsule, Rfl: 0  Review of Systems:  Constitutional: Denies fever, chills, diaphoresis, appetite change and fatigue.  HEENT: Denies photophobia, eye pain, redness, hearing loss, ear pain, congestion, sore throat, rhinorrhea, sneezing, mouth sores, trouble swallowing, neck pain, neck stiffness and tinnitus.   Respiratory: Denies SOB, DOE, cough, chest tightness,  and wheezing.   Cardiovascular: Denies chest pain, palpitations and leg swelling.  Gastrointestinal: Denies nausea, vomiting, abdominal pain, diarrhea, constipation, blood in stool and abdominal distention.  Genitourinary: Denies dysuria, urgency, frequency, hematuria, flank pain and difficulty urinating.  Endocrine: Denies: hot or cold intolerance, sweats, changes in hair or nails, polyuria, polydipsia. Musculoskeletal: Denies myalgias, back  pain, joint swelling, arthralgias and gait problem.  Skin: Denies pallor, rash and wound.  Neurological: Denies dizziness, seizures, syncope, weakness, light-headedness, numbness and headaches.  Hematological: Denies adenopathy. Easy bruising, personal or family bleeding history  Psychiatric/Behavioral: Denies suicidal ideation, mood changes, confusion, nervousness, sleep disturbance and agitation    Physical Exam: Vitals:   07/06/19 1439  BP: 120/82  Pulse: 99  Temp: 97.7 F (36.5 C)  TempSrc: Other (Comment)  SpO2: 99%  Weight: 261 lb (118.4 kg)  Height: 5' (1.524 m)    Body mass index is 50.97 kg/m.   Constitutional: NAD, calm, comfortable, morbidly obese Eyes: PERRL, lids and conjunctivae normal, wears corrective lenses ENMT: Mucous membranes are moist.  Respiratory: clear to auscultation bilaterally, no wheezing, no crackles. Normal respiratory effort. No accessory muscle use.  Cardiovascular: Regular rate  and rhythm, no murmurs / rubs / gallops. No extremity edema.  Neurologic: Grossly intact and nonfocal Psychiatric: Normal judgment and insight. Alert and oriented x 3. Normal mood.    Impression and Plan:  Uncontrolled type 2 diabetes mellitus with hypoglycemia without coma ([place]) -CBG in office today is 70 and this is after orange juice and 2 glucose tablets about 30 minutes ago. -Effective immediately she will discontinue glipizide, continue Metformin. -She will be vigilant with her blood sugars and contact me if still has episodes of hypoglycemia after 1 week.  Frequent bruising. -This is her second time bringing this up with me. -For completeness sake will order a CBC with differential, PT and PTT. -Further work-up pending results.    Patient Instructions  -Nice seeing you today!!  -Lab work today; will notify you once results are available.  -Stop taking glipizide.  -Schedule follow up in 3 months.  -Call me if still low blood sugars after 1  week.     Lelon Frohlich, MD [facility]

## 2019-07-06 NOTE — Patient Instructions (Signed)
-  Nice seeing you today!!  -Lab work today; will notify you once results are available.  -Stop taking glipizide.  -Schedule follow up in 3 months.  -Call me if still low blood sugars after 1 week.

## 2019-07-12 ENCOUNTER — Ambulatory Visit
Admission: EM | Admit: 2019-07-12 | Discharge: 2019-07-12 | Disposition: A | Discharge status: Home / Self Care | Payer: BC Managed Care – PPO | Attending: Emergency Medicine | Admitting: Emergency Medicine

## 2019-07-12 DIAGNOSIS — G44209 Tension-type headache, unspecified, not intractable: Secondary | ICD-10-CM

## 2019-07-12 DIAGNOSIS — I10 Essential (primary) hypertension: Secondary | ICD-10-CM | POA: Diagnosis not present

## 2019-07-12 DIAGNOSIS — M62838 Other muscle spasm: Secondary | ICD-10-CM | POA: Diagnosis not present

## 2019-07-12 MED ORDER — KETOROLAC TROMETHAMINE 30 MG/ML IJ SOLN
30.0000 mg | Freq: Once | INTRAMUSCULAR | Status: AC
  Administered 2019-07-12: 30 mg via INTRAMUSCULAR

## 2019-07-12 MED ORDER — CYCLOBENZAPRINE HCL 5 MG PO TABS
5.0000 mg | ORAL_TABLET | Freq: Two times a day (BID) | ORAL | 0 refills | Status: AC | PRN
Start: 2019-07-12 — End: 2019-07-17

## 2019-07-12 MED ORDER — ONDANSETRON 4 MG PO TBDP
4.0000 mg | ORAL_TABLET | Freq: Once | ORAL | Status: AC
  Administered 2019-07-12: 4 mg via ORAL

## 2019-07-12 MED ORDER — DEXAMETHASONE SODIUM PHOSPHATE 10 MG/ML IJ SOLN
10.0000 mg | Freq: Once | INTRAMUSCULAR | Status: AC
  Administered 2019-07-12: 10 mg via INTRAMUSCULAR

## 2019-07-12 NOTE — ED Provider Notes (Signed)
[facility]    CSN: [id number] Arrival date & time: 07/12/19  1805      History   Chief Complaint Chief Complaint  Patient presents with  . Headache    HPI Lynn Fields is a 49 y.o. female with history of obesity, hypertension, migraines, diabetes presenting for elevated blood pressure reading, tension headache, left trapezius pain.  Pain is described as a pushing sensation.  No upper extremity numbness or weakness.  Denies history of stroke or heart attack.  Reports compliance with BP medications at home and is able to check them daily.  No chest pain, shortness of breath, lower leg swelling, abdominal pain, change in vision or hearing, dizziness.  Took Aleve around 3 PM without relief.   Past Medical History:  Diagnosis Date  . Arthritis    knees  . Depression   . Diabetes mellitus without complication ([place])   . Hypertension   . Migraines   . Ulcer     Patient Active Problem List   Diagnosis Date Noted  . Unilateral primary osteoarthritis, left knee 11/17/2018  . Unilateral primary osteoarthritis, right knee 11/17/2018  . Vitamin D deficiency 10/26/2018  . Fatty liver 10/05/2018  . Hot flashes 08/24/2018  . Poor appetite 07/27/2018  . Chronic headaches 07/27/2018  . Right hand pain 07/06/2018  . Hyperlipidemia associated with type 2 diabetes mellitus ([place]) 08/10/2017  . Allergic reaction to food 02/04/2017  . Adjustment disorder with mixed anxiety and depressed mood 08/07/2016  . Dysmenorrhea 04/15/2016  . Menorrhagia 04/15/2016  . Degenerative arthritis of knee, bilateral 02/11/2016  . Routine general medical examination at a health care facility 08/16/2015  . Essential hypertension 06/14/2012  . Uncontrolled diabetes mellitus ([place]) 06/14/2012  . Obesity, morbid ([place]) 06/14/2012    Past Surgical History:  Procedure Laterality Date  . CESAREAN SECTION  1993  . HYSTEROSCOPY WITH NOVASURE N/A 06/02/2016   Procedure: HYSTEROSCOPY WITH NOVASURE;   Surgeon: Paula Compton, MD;  Location: [facility];  Service: Gynecology;  Laterality: N/A;  . TUBAL LIGATION  1993    OB History   No obstetric history on file.      Home Medications    Prior to Admission medications   Medication Sig Start Date End Date Taking? Authorizing Provider  acetaminophen (TYLENOL) 500 MG tablet Take 1,000 mg by mouth every 6 (six) hours as needed for headache.    [provider]  atorvastatin (LIPITOR) 40 MG tablet TAKE 1 TABLET BY MOUTH ONCE DAILY AT  6  PM 04/28/19   Isaac Bliss, Rayford Halsted, MD  buPROPion (WELLBUTRIN XL) 300 MG 24 hr tablet Take 1 tablet (300 mg total) by mouth daily. 04/28/19   Isaac Bliss, Rayford Halsted, MD  CINNAMON PO Take 1 tablet by mouth daily.    [provider]  cyclobenzaprine (FLEXERIL) 5 MG tablet Take 1 tablet (5 mg total) by mouth 2 (two) times daily as needed for up to 5 days for muscle spasms. 07/12/19 07/17/19  Hall-Potvin, [place], PA-C  EPINEPHrine (EPIPEN JR) 0.15 MG/0.3ML injection Inject 0.6 mLs (0.3 mg total) into the muscle once as needed for up to 1 dose for anaphylaxis. 10/26/18   Isaac Bliss, Rayford Halsted, MD  hydrochlorothiazide (HYDRODIURIL) 25 MG tablet Take 1 tablet (25 mg total) by mouth daily. 04/28/19   Isaac Bliss, Rayford Halsted, MD  losartan (COZAAR) 50 MG tablet Take 1 tablet (50 mg total) by mouth daily. 04/28/19   Isaac Bliss, Rayford Halsted, MD  meloxicam (MOBIC) 15 MG  tablet Take 1 tablet (15 mg total) by mouth daily. 04/28/19   Isaac Bliss, Rayford Halsted, MD  metFORMIN (GLUCOPHAGE) 1000 MG tablet Take 1 tablet (1,000 mg total) by mouth 2 (two) times daily with a meal. 04/28/19   Isaac Bliss, Rayford Halsted, MD  montelukast (SINGULAIR) 10 MG tablet Take 1 tablet (10 mg total) by mouth at bedtime. 04/28/19   Isaac Bliss, Rayford Halsted, MD  naproxen (NAPROSYN) 500 MG tablet Take 1 tablet (500 mg total) by mouth 2 (two) times daily. 05/19/19   Hall-Potvin, [place], PA-C  ondansetron (ZOFRAN ODT) 4 MG  disintegrating tablet Take 1 tablet (4 mg total) by mouth every 8 (eight) hours as needed for nausea or vomiting. 04/28/19   Isaac Bliss, Rayford Halsted, MD  SUMAtriptan (IMITREX) 50 MG tablet Take 1 tablet (50 mg total) by mouth once as needed for up to 1 dose for migraine. May repeat in 2 hours if headache persists or recurs. 04/28/19   Isaac Bliss, Rayford Halsted, MD  tiZANidine (ZANAFLEX) 4 MG tablet Take 1 tablet (4 mg total) by mouth at bedtime. 06/10/19   Lyndal Pulley, DO  Vitamin D, Ergocalciferol, (DRISDOL) 1.25 MG (50000 UNIT) CAPS capsule Take 1 capsule (50,000 Units total) by mouth every 7 (seven) days for 12 doses. 05/03/19 07/20/19  Erline Hau, MD    Family History Family History  Problem Relation Age of Onset  . Diabetes Mother   . Hypertension Mother   . Heart disease Mother   . Hyperlipidemia Mother   . Diabetes Father   . Hypertension Father   . Heart disease Father   . Hyperlipidemia Father   . Cervical cancer Sister   . Heart disease Sister   . Diabetes Sister   . Heart disease Sister   . Diabetes Sister     Social History Social History   Tobacco Use  . Smoking status: Never Smoker  . Smokeless tobacco: Never Used  Vaping Use  . Vaping Use: Never used  Substance Use Topics  . Alcohol use: Not Currently    Comment: occ  . Drug use: No     Allergies   Iodine, Other, Shellfish allergy, Codeine, Menthol, Morphine and related, Penicillins, Sudafed [pseudoephedrine hcl], Azithromycin, and Erythromycin   Review of Systems As per HPI   Physical Exam Triage Vital Signs ED Triage Vitals  Enc Vitals Group     BP      Pulse      Resp      Temp      Temp src      SpO2      Weight      Height      Head Circumference      Peak Flow      Pain Score      Pain Loc      Pain Edu?      Excl. in [place]?    No data found.  Updated Vital Signs BP (!) 151/95 (BP Location: Left Arm)   Pulse 83   Temp 98.2 F (36.8 C) (Oral)   Resp 18   SpO2  99%   Visual Acuity Right Eye Distance:   Left Eye Distance:   Bilateral Distance:    Right Eye Near:   Left Eye Near:    Bilateral Near:     Physical Exam Constitutional:      General: She is not in acute distress.    Appearance: She is well-developed. She is obese. She  is not ill-appearing.  HENT:     Head: Normocephalic and atraumatic.     Right Ear: Tympanic membrane, ear canal and external ear normal.     Left Ear: Tympanic membrane, ear canal and external ear normal.     Mouth/Throat:     Mouth: Mucous membranes are moist.     Pharynx: Oropharynx is clear.  Eyes:     General: No scleral icterus.    Extraocular Movements: Extraocular movements intact.     Pupils: Pupils are equal, round, and reactive to light.  Cardiovascular:     Rate and Rhythm: Normal rate and regular rhythm.  Pulmonary:     Effort: Pulmonary effort is normal. No respiratory distress.     Breath sounds: No wheezing.  Musculoskeletal:        General: Tenderness present.     Comments: Left trapezius with tension  Skin:    Coloration: Skin is not jaundiced or pale.  Neurological:     Mental Status: She is alert and oriented to person, place, and time.     Cranial Nerves: No cranial nerve deficit, dysarthria or facial asymmetry.     Sensory: No sensory deficit.     Motor: No weakness.     Gait: Gait normal.     Deep Tendon Reflexes: Reflexes normal.  Psychiatric:        Mood and Affect: Mood normal.        Speech: Speech normal.      UC Treatments / Results  Labs (all labs ordered are listed, but only abnormal results are displayed) Labs Reviewed - No data to display  EKG   Radiology No results found.  Procedures Procedures (including critical care time)  Medications Ordered in UC Medications  ketorolac (TORADOL) 30 MG/ML injection 30 mg (30 mg Intramuscular Given 07/12/19 1935)  dexamethasone (DECADRON) injection 10 mg (10 mg Intramuscular Given 07/12/19 1935)  ondansetron  (ZOFRAN-ODT) disintegrating tablet 4 mg (4 mg Oral Given 07/12/19 1935)    Initial Impression / Assessment and Plan / UC Course  I have reviewed the triage vital signs and the nursing notes.  Pertinent labs & imaging results that were available during my care of the patient were reviewed by me and considered in my medical decision making (see chart for details).     Afebrile, nontoxic, and hemodynamically stable in office.  Blood pressure is elevated from baseline.  EKG done office, reviewed by me and to compared to previous from 06/14/2018: NSR with ventricular rate 72 bpm.  No QTC prolongation, ST elevation or depression.  Waveforms stable in all leads: Nonacute EKG.  Reviewed findings with patient verbalized understanding. Tension headache due to significant tension in left trapezius.  Patient given headache cocktail in office which she has tolerated well in the past-tolerated well today.  We will treat supportively as outlined below.  Return precautions discussed, patient verbalized understanding and is agreeable to plan. Final Clinical Impressions(s) / UC Diagnoses   Final diagnoses:  Elevated blood pressure reading with diagnosis of hypertension  Tension headache  Trapezius muscle spasm     Discharge Instructions     Recommend RICE: rest, ice, compression, elevation as needed for pain.    Heat therapy (hot compress, warm wash rag, hot showers, etc.) can help relax muscles and soothe muscle aches. Cold therapy (ice packs) can be used to help swelling both after injury and after prolonged use of areas of chronic pain/aches.  For pain: recommend 350 mg-1000 mg of Tylenol (acetaminophen)  and/or 200 mg - 800 mg of Advil (ibuprofen, Motrin) every 8 hours as needed.  May alternate between the two throughout the day as they are generally safe to take together.  DO NOT exceed more than 3000 mg of Tylenol or 3200 mg of ibuprofen in a 24 hour period as this could damage your stomach, kidneys,  liver, or increase your bleeding risk.  May take muscle relaxer as needed for severe pain / spasm.  (This medication may cause you to become tired so it is important you do not drink alcohol or operate heavy machinery while on this medication.  Recommend your first dose to be taken before bedtime to monitor for side effects safely)    ED Prescriptions    Medication Sig Dispense Auth. Provider   cyclobenzaprine (FLEXERIL) 5 MG tablet Take 1 tablet (5 mg total) by mouth 2 (two) times daily as needed for up to 5 days for muscle spasms. 10 tablet Hall-Potvin, [place], PA-C     PDMP not reviewed this encounter.   Hall-Potvin, [place], [place] 07/12/19 1953

## 2019-07-12 NOTE — ED Triage Notes (Signed)
Pt states has been going to bed and waking up with a headache x 2days. States today having lt arm heaviness and states her lt lip would quiver when smiling. States having an ache to lt sided chest x1hr. No facial droop or pronator drip noted. Decreased strength to Lt hand grip and lt foot.

## 2019-07-12 NOTE — Discharge Instructions (Signed)
Recommend RICE: rest, ice, compression, elevation as needed for pain.    Heat therapy (hot compress, warm wash rag, hot showers, etc.) can help relax muscles and soothe muscle aches. Cold therapy (ice packs) can be used to help swelling both after injury and after prolonged use of areas of chronic pain/aches.  For pain: recommend 350 mg-1000 mg of Tylenol (acetaminophen) and/or 200 mg - 800 mg of Advil (ibuprofen, Motrin) every 8 hours as needed.  May alternate between the two throughout the day as they are generally safe to take together.  DO NOT exceed more than 3000 mg of Tylenol or 3200 mg of ibuprofen in a 24 hour period as this could damage your stomach, kidneys, liver, or increase your bleeding risk.  May take muscle relaxer as needed for severe pain / spasm.  (This medication may cause you to become tired so it is important you do not drink alcohol or operate heavy machinery while on this medication.  Recommend your first dose to be taken before bedtime to monitor for side effects safely) 

## 2019-07-22 ENCOUNTER — Encounter: Payer: Self-pay | Admitting: Family Medicine

## 2019-07-22 ENCOUNTER — Ambulatory Visit: Payer: BC Managed Care – PPO | Admitting: Family Medicine

## 2019-07-22 ENCOUNTER — Other Ambulatory Visit: Payer: Self-pay

## 2019-07-22 DIAGNOSIS — M170 Bilateral primary osteoarthritis of knee: Secondary | ICD-10-CM

## 2019-07-22 NOTE — Assessment & Plan Note (Signed)
Bilateral knee pain.  Severe arthritic changes that are bone-on-bone.  Chronic problem with exacerbation.  Continue the anti-inflammatories.  Discussed with patient about icing regimen and home exercises discussed importance of weight loss which patient is attempting.  Follow-up again in 8 to 10 weeks

## 2019-07-22 NOTE — Progress Notes (Signed)
[facility] [place] Phone: [phone] Subjective:   Rito Ehrlich, am serving as a scribe for Dr. Hulan Saas.  This visit occurred during the SARS-CoV-2 public health emergency.  Safety protocols were in place, including screening questions prior to the visit, additional usage of staff PPE, and extensive cleaning of exam room while observing appropriate contact time as indicated for disinfecting solutions.   I'm seeing this patient by the request  of:  Lynn Fields, Lynn Halsted, MD  CC: Bilateral knee pain  SKA:[id number]   06/10/2019 Bilateral injections once again given today.  Worsening pain.  Could be contributing to some of the leg pain.  I am concerned though with patient's family history of a potential DVT.  Patient has taken an aspirin and encouraged her to continue to do so until she does have a Doppler done.  Follow-up again 6 to 8 weeks otherwise  07/22/2019 Lynn Fields is a 49 y.o. female coming in with complaint of bilateral knee and right calf pain. Patient states that the pain is the same states when she got the DVT done the lady never checked her calf only checked the front of her leg. Knees are hurting would like injections as it has been hurting her to walk.        Past Medical History:  Diagnosis Date  . Arthritis    knees  . Depression   . Diabetes mellitus without complication ([place])   . Hypertension   . Migraines   . Ulcer    Past Surgical History:  Procedure Laterality Date  . CESAREAN SECTION  1993  . HYSTEROSCOPY WITH NOVASURE N/A 06/02/2016   Procedure: HYSTEROSCOPY WITH NOVASURE;  Surgeon: Paula Compton, MD;  Location: [facility];  Service: Gynecology;  Laterality: N/A;  . TUBAL LIGATION  1993   Social History   Socioeconomic History  . Marital status: Married    Spouse name: Not on file  . Number of children: Not on file  . Years of education: 59  . Highest education level:  Not on file  Occupational History  . Occupation: Product manager: [employer]  Tobacco Use  . Smoking status: Never Smoker  . Smokeless tobacco: Never Used  Vaping Use  . Vaping Use: Never used  Substance and Sexual Activity  . Alcohol use: Not Currently    Comment: occ  . Drug use: No  . Sexual activity: Yes    Birth control/protection: Surgical  Other Topics Concern  . Not on file  Social History Narrative   Regular exercise-yes   Caffeine Use-no   Social Determinants of Health   Financial Resource Strain:   . Difficulty of Paying Living Expenses:   Food Insecurity:   . Worried About Charity fundraiser in the Last Year:   . Arboriculturist in the Last Year:   Transportation Needs:   . Film/video editor (Medical):   Marland Kitchen Lack of Transportation (Non-Medical):   Physical Activity:   . Days of Exercise per Week:   . Minutes of Exercise per Session:   Stress:   . Feeling of Stress :   Social Connections:   . Frequency of Communication with Friends and Family:   . Frequency of Social Gatherings with Friends and Family:   . Attends Religious Services:   . Active Member of Clubs or Organizations:   . Attends Archivist Meetings:   Marland Kitchen Marital  Status:    Allergies  Allergen Reactions  . Iodine Anaphylaxis  . Other Anaphylaxis    Hair dye  . Shellfish Allergy Anaphylaxis    Closes throat  . Codeine Itching  . Menthol Hives  . Morphine And Related Itching  . Penicillins Swelling    Has patient had a PCN reaction causing immediate rash, facial/tongue/throat swelling, SOB or lightheadedness with hypotension: Yes Has patient had a PCN reaction causing severe rash involving mucus membranes or skin necrosis: No Has patient had a PCN reaction that required hospitalization No Has patient had a PCN reaction occurring within the last 10 years: No If all of the above answers are "NO", then may proceed with Cephalosporin use.  [facility]  [Pseudoephedrine Hcl] Swelling  . Azithromycin Rash  . Erythromycin Rash   Family History  Problem Relation Age of Onset  . Diabetes Mother   . Hypertension Mother   . Heart disease Mother   . Hyperlipidemia Mother   . Diabetes Father   . Hypertension Father   . Heart disease Father   . Hyperlipidemia Father   . Cervical cancer Sister   . Heart disease Sister   . Diabetes Sister   . Heart disease Sister   . Diabetes Sister     Current Outpatient Medications (Endocrine & Metabolic):  .  metFORMIN (GLUCOPHAGE) 1000 MG tablet, Take 1 tablet (1,000 mg total) by mouth 2 (two) times daily with a meal.  Current Outpatient Medications (Cardiovascular):  .  atorvastatin (LIPITOR) 40 MG tablet, TAKE 1 TABLET BY MOUTH ONCE DAILY AT  6  PM .  EPINEPHrine (EPIPEN JR) 0.15 MG/0.3ML injection, Inject 0.6 mLs (0.3 mg total) into the muscle once as needed for up to 1 dose for anaphylaxis. .  hydrochlorothiazide (HYDRODIURIL) 25 MG tablet, Take 1 tablet (25 mg total) by mouth daily. Marland Kitchen  losartan (COZAAR) 50 MG tablet, Take 1 tablet (50 mg total) by mouth daily.  Current Outpatient Medications (Respiratory):  .  montelukast (SINGULAIR) 10 MG tablet, Take 1 tablet (10 mg total) by mouth at bedtime.  Current Outpatient Medications (Analgesics):  .  acetaminophen (TYLENOL) 500 MG tablet, Take 1,000 mg by mouth every 6 (six) hours as needed for headache. .  meloxicam (MOBIC) 15 MG tablet, Take 1 tablet (15 mg total) by mouth daily. .  naproxen (NAPROSYN) 500 MG tablet, Take 1 tablet (500 mg total) by mouth 2 (two) times daily. .  SUMAtriptan (IMITREX) 50 MG tablet, Take 1 tablet (50 mg total) by mouth once as needed for up to 1 dose for migraine. May repeat in 2 hours if headache persists or recurs.   Current Outpatient Medications (Other):  Marland Kitchen  buPROPion (WELLBUTRIN XL) 300 MG 24 hr tablet, Take 1 tablet (300 mg total) by mouth daily. .  ondansetron (ZOFRAN ODT) 4 MG disintegrating tablet, Take 1  tablet (4 mg total) by mouth every 8 (eight) hours as needed for nausea or vomiting. Marland Kitchen  tiZANidine (ZANAFLEX) 4 MG tablet, Take 1 tablet (4 mg total) by mouth at bedtime. Marland Kitchen  CINNAMON PO, Take 1 tablet by mouth daily.   Reviewed prior external information including notes and imaging from  primary care provider As well as notes that were available from care everywhere and other healthcare systems.  Past medical history, social, surgical and family history all reviewed in electronic medical record.  No pertanent information unless stated regarding to the chief complaint.   Review of Systems:  No headache, visual changes, nausea, vomiting,  diarrhea, constipation, dizziness, abdominal pain, skin rash, fevers, chills, night sweats, weight loss, swollen lymph nodes, body aches, joint swelling, chest pain, shortness of breath, mood changes. POSITIVE muscle aches  Objective  Blood pressure 130/82, pulse 86, height 5' (1.524 m), SpO2 98 %.   General: No apparent distress alert and oriented x3 mood and affect normal, dressed appropriately.  Morbidly obese HEENT: Pupils equal, extraocular movements intact  Respiratory: Patient's speak in full sentences and does not appear short of breath  Cardiovascular: Trace lower extremity edema, non tender, no erythema  Neuro: Cranial nerves II through XII are intact, neurovascularly intact in all extremities with 2+ DTRs and 2+ pulses.  Gait severely antalgic MSK: Knee: Bilateral valgus deformity noted. Large thigh to calf ratio.  Tender to palpation over medial and PF joint line.  Limited range of motion in flexion and extension instability with valgus force.  painful patellar compression. Patellar glide with moderate crepitus. Patellar and quadriceps tendons unremarkable. Hamstring and quadriceps strength is normal.  After informed written and verbal consent, patient was seated on exam table. Right knee was prepped with alcohol swab and utilizing  anterolateral approach, patient's right knee space was injected with 4:1  marcaine 0.5%: Kenalog 40mg /dL. Patient tolerated the procedure well without immediate complications.  After informed written and verbal consent, patient was seated on exam table. Left knee was prepped with alcohol swab and utilizing anterolateral approach, patient's left knee space was injected with 4:1  marcaine 0.5%: Kenalog 40mg /dL. Patient tolerated the procedure well without immediate complications.   Impression and Recommendations:     The above documentation has been reviewed and is accurate and complete Lyndal Pulley, DO       Note: This dictation was prepared with Dragon dictation along with smaller phrase technology. Any transcriptional errors that result from this process are unintentional.

## 2019-07-23 ENCOUNTER — Encounter: Payer: Self-pay | Admitting: Family Medicine

## 2019-07-28 ENCOUNTER — Ambulatory Visit: Payer: BC Managed Care – PPO | Admitting: *Deleted

## 2019-07-28 ENCOUNTER — Other Ambulatory Visit: Payer: Self-pay

## 2019-07-28 VITALS — Ht 60.0 in | Wt 260.4 lb

## 2019-07-28 DIAGNOSIS — Z1211 Encounter for screening for malignant neoplasm of colon: Secondary | ICD-10-CM

## 2019-07-28 DIAGNOSIS — Z01818 Encounter for other preprocedural examination: Secondary | ICD-10-CM

## 2019-07-28 NOTE — Progress Notes (Signed)

## 2019-08-03 ENCOUNTER — Ambulatory Visit: Payer: BC Managed Care – PPO | Admitting: Internal Medicine

## 2019-08-04 ENCOUNTER — Other Ambulatory Visit (HOSPITAL_COMMUNITY)
Admission: RE | Admit: 2019-08-04 | Discharge: 2019-08-04 | Disposition: A | Discharge status: Home / Self Care | Payer: BC Managed Care – PPO | Source: Ambulatory Visit | Attending: Gastroenterology | Admitting: Gastroenterology

## 2019-08-04 DIAGNOSIS — Z01812 Encounter for preprocedural laboratory examination: Secondary | ICD-10-CM | POA: Diagnosis not present

## 2019-08-04 DIAGNOSIS — Z20822 Contact with and (suspected) exposure to covid-19: Secondary | ICD-10-CM | POA: Diagnosis not present

## 2019-08-04 LAB — SARS CORONAVIRUS 2 (TAT 6-24 HRS): SARS Coronavirus 2: NEGATIVE

## 2019-08-07 ENCOUNTER — Encounter (HOSPITAL_COMMUNITY): Payer: Self-pay | Admitting: Gastroenterology

## 2019-08-08 ENCOUNTER — Ambulatory Visit (HOSPITAL_COMMUNITY)
Admission: RE | Admit: 2019-08-08 | Discharge: 2019-08-08 | Disposition: A | Discharge status: Home / Self Care | Payer: BC Managed Care – PPO | Attending: Gastroenterology | Admitting: Gastroenterology

## 2019-08-08 ENCOUNTER — Ambulatory Visit (HOSPITAL_COMMUNITY): Payer: BC Managed Care – PPO | Admitting: Anesthesiology

## 2019-08-08 ENCOUNTER — Encounter (HOSPITAL_COMMUNITY): Payer: Self-pay | Admitting: Gastroenterology

## 2019-08-08 ENCOUNTER — Encounter (HOSPITAL_COMMUNITY)
Admission: RE | Disposition: A | Discharge status: Home / Self Care | Payer: Self-pay | Source: Home / Self Care | Attending: Gastroenterology

## 2019-08-08 ENCOUNTER — Other Ambulatory Visit: Payer: Self-pay

## 2019-08-08 DIAGNOSIS — E119 Type 2 diabetes mellitus without complications: Secondary | ICD-10-CM | POA: Diagnosis not present

## 2019-08-08 DIAGNOSIS — Z91013 Allergy to seafood: Secondary | ICD-10-CM | POA: Insufficient documentation

## 2019-08-08 DIAGNOSIS — K5730 Diverticulosis of large intestine without perforation or abscess without bleeding: Secondary | ICD-10-CM | POA: Diagnosis not present

## 2019-08-08 DIAGNOSIS — Z1211 Encounter for screening for malignant neoplasm of colon: Secondary | ICD-10-CM | POA: Diagnosis not present

## 2019-08-08 DIAGNOSIS — Z885 Allergy status to narcotic agent status: Secondary | ICD-10-CM | POA: Insufficient documentation

## 2019-08-08 DIAGNOSIS — Z6841 Body Mass Index (BMI) 40.0 and over, adult: Secondary | ICD-10-CM | POA: Insufficient documentation

## 2019-08-08 DIAGNOSIS — I10 Essential (primary) hypertension: Secondary | ICD-10-CM | POA: Diagnosis not present

## 2019-08-08 DIAGNOSIS — Z8371 Family history of colonic polyps: Secondary | ICD-10-CM | POA: Insufficient documentation

## 2019-08-08 DIAGNOSIS — Z8249 Family history of ischemic heart disease and other diseases of the circulatory system: Secondary | ICD-10-CM | POA: Insufficient documentation

## 2019-08-08 DIAGNOSIS — Z880 Allergy status to penicillin: Secondary | ICD-10-CM | POA: Diagnosis not present

## 2019-08-08 DIAGNOSIS — Z888 Allergy status to other drugs, medicaments and biological substances status: Secondary | ICD-10-CM | POA: Diagnosis not present

## 2019-08-08 DIAGNOSIS — Z833 Family history of diabetes mellitus: Secondary | ICD-10-CM | POA: Insufficient documentation

## 2019-08-08 DIAGNOSIS — M1990 Unspecified osteoarthritis, unspecified site: Secondary | ICD-10-CM | POA: Insufficient documentation

## 2019-08-08 DIAGNOSIS — Z881 Allergy status to other antibiotic agents status: Secondary | ICD-10-CM | POA: Diagnosis not present

## 2019-08-08 DIAGNOSIS — Z800 Family history of malignant neoplasm of digestive organs: Secondary | ICD-10-CM

## 2019-08-08 HISTORY — PX: COLONOSCOPY WITH PROPOFOL: SHX5780

## 2019-08-08 LAB — GLUCOSE, CAPILLARY: Glucose-Capillary: 120 mg/dL — ABNORMAL HIGH (ref 70–99)

## 2019-08-08 SURGERY — COLONOSCOPY WITH PROPOFOL
Anesthesia: Monitor Anesthesia Care

## 2019-08-08 MED ORDER — MIDAZOLAM HCL 5 MG/5ML IJ SOLN
INTRAMUSCULAR | Status: DC | PRN
  Administered 2019-08-08: 2 mg via INTRAVENOUS

## 2019-08-08 MED ORDER — MIDAZOLAM HCL 2 MG/2ML IJ SOLN
INTRAMUSCULAR | Status: AC
  Filled 2019-08-08: qty 2

## 2019-08-08 MED ORDER — SODIUM CHLORIDE 0.9 % IV SOLN: INTRAVENOUS | Status: DC

## 2019-08-08 MED ORDER — LACTATED RINGERS IV SOLN
INTRAVENOUS | Status: AC | PRN
  Administered 2019-08-08: 1000 mL via INTRAVENOUS

## 2019-08-08 MED ORDER — PROPOFOL 500 MG/50ML IV EMUL
INTRAVENOUS | Status: DC | PRN
  Administered 2019-08-08: 125 ug/kg/min via INTRAVENOUS

## 2019-08-08 MED ORDER — PROPOFOL 500 MG/50ML IV EMUL
INTRAVENOUS | Status: DC | PRN
  Administered 2019-08-08: 30 mg via INTRAVENOUS

## 2019-08-08 MED ORDER — ONDANSETRON HCL 4 MG/2ML IJ SOLN
INTRAMUSCULAR | Status: DC | PRN
  Administered 2019-08-08: 4 mg via INTRAVENOUS

## 2019-08-08 SURGICAL SUPPLY — 22 items

## 2019-08-08 NOTE — Op Note (Signed)
[facility] Patient Name: Lynn Fields Procedure Date: 08/08/2019 MRN: [id number] Attending MD: Thornton Park MD, MD Date of Birth: April 19, 1970 CSN: [id number] Age: 49 Admit Type: Outpatient Procedure:                Colonoscopy Indications:              Colon cancer screening in patient at increased                            risk: Family history of colon polyps in multiple                            1st-degree relatives                           No prior colon cancer screening                           Family history of colon polyps (father)                           Family history of colon cancer (maternal aunt in                            her 33s) Providers:                Thornton Park MD, MD, Cleda Daub, RN, Cletis Athens, Technician, Arnoldo Hooker, CRNA Referring MD:              Medicines:                Monitored Anesthesia Care Complications:            No immediate complications. Estimated Blood Loss:     Estimated blood loss: none. Procedure:                Pre-Anesthesia Assessment:                           - Prior to the procedure, a History and Physical                            was performed, and patient medications and                            allergies were reviewed. The patient's tolerance of                            previous anesthesia was also reviewed. The risks                            and benefits of the procedure and the sedation                            options and risks were discussed with the patient.  All questions were answered, and informed consent                            was obtained. Prior Anticoagulants: The patient has                            taken no previous anticoagulant or antiplatelet                            agents. ASA Grade Assessment: III - A patient with                            severe systemic disease. After reviewing the risks                             and benefits, the patient was deemed in                            satisfactory condition to undergo the procedure.                           After obtaining informed consent, the colonoscope                            was passed under direct vision. Throughout the                            procedure, the patient's blood pressure, pulse, and                            oxygen saturations were monitored continuously. The                            PCF-H190DL ([id number]) Olympus pediatric colonscope                            was introduced through the anus and advanced to the                            4 cm into the ileum. A second forward view of the                            right colon was performed. The colonoscopy was                            performed without difficulty. The patient tolerated                            the procedure well. The quality of the bowel                            preparation was good. Scope In: 9:41:34 AM Scope Out: 9:54:54 AM Scope Withdrawal Time: 0 hours 8 minutes 56 seconds  Total Procedure Duration: 0 hours 13 minutes 20 seconds  Findings:      The perianal and digital rectal examinations were normal.      A few small-mouthed diverticula were found in the sigmoid colon and       distal descending colon.      The exam was otherwise without abnormality on direct and retroflexion       views. Impression:               - Diverticulosis in the sigmoid colon and distal                            descending colon.                           - The examination was otherwise normal on direct                            and retroflexion views.                           - No specimens collected. Moderate Sedation:      Not Applicable - Patient had care per Anesthesia. Recommendation:           - Patient has a contact number available for                            emergencies. The signs and symptoms of potential                            delayed  complications were discussed with the                            patient. Return to normal activities tomorrow.                            Written discharge instructions were provided to the                            patient.                           - Resume previous diet.                           - Continue present medications.                           - Repeat colonoscopy in 5 years for surveillance                            given the family history.                           - Emerging evidence supports eating a diet of  fruits, vegetables, grains, calcium, and yogurt                            while reducing red meat and alcohol may reduce the                            risk of colon cancer.                           - Thank you for allowing me to be involved in your                            colon cancer prevention. Procedure Code(s):        --- Professional ---                           F2902, Colorectal cancer screening; colonoscopy on                            individual at high risk Diagnosis Code(s):        --- Professional ---                           Z83.71, Family history of colonic polyps                           K57.30, Diverticulosis of large intestine without                            perforation or abscess without bleeding CPT copyright 2019 American Medical Association. All rights reserved. The codes documented in this report are preliminary and upon coder review may  be revised to meet current compliance requirements. Thornton Park MD, MD 08/08/2019 10:05:55 AM This report has been signed electronically. Number of Addenda: 0

## 2019-08-08 NOTE — Transfer of Care (Signed)
Immediate Anesthesia Transfer of Care Note  Patient: Lynn Fields  Procedure(s) Performed: Procedure(s): COLONOSCOPY WITH PROPOFOL (N/A)  Patient Location: PACU  Anesthesia Type:MAC  Level of Consciousness:  sedated, patient cooperative and responds to stimulation  Airway & Oxygen Therapy:Patient Spontanous Breathing and Patient connected to face mask oxgen  Post-op Assessment:  Report given to PACU RN and Post -op Vital signs reviewed and stable  Post vital signs:  Reviewed and stable  Last Vitals:  Vitals:   08/08/19 0824  BP: (!) 160/97  Pulse: 75  Resp: 18  Temp: 37.1 C  SpO2: 976%    Complications: No apparent anesthesia complications

## 2019-08-08 NOTE — H&P (Signed)
Referring Provider: No ref. provider found Primary Care Physician:  Isaac Bliss, Rayford Halsted, MD  Reason for Consultation: Cancer screening   IMPRESSION:  No prior colon cancer screening Family history of colon polyps (father) Family history of colon cancer (maternal aunt in her 21s) BMI 49.2  Colonoscopy recommended for colon cancer screening.   PLAN: Colonoscopy   HPI: Lynn Fields is a 49 y.o. female referred by Dr. Isaac Bliss for colon cancer screening.  The patient wished to come in for consultation prior to her endoscopy.  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  Her father died of Covid in 2022-03-13. She has an identical twin. Her husband accompanies her to this appointment. She has had the Covid vaccine.   Labs from 10/26/2018 showing normal CBC with a hemoglobin of 14, MCV 85.6, RDW 13.4  There is no dysphagia, odynophagia, regurgitation,  heartburn, nausea, abdominal pain, change in bowel habits, melena, hematochezia, or bright red blood per rectum. There is no anorexia or recent change in weight.   Father with colon polyps. Paternal aunt with colon cancer in her 56s. Sisters with diverticulitis. No other known family history of colon cancer or polyps. No family history of uterine/endometrial cancer, pancreatic cancer or gastric/stomach cancer.   Past Medical History:  Diagnosis Date  . Allergy   . Arthritis    knees  . Depression   . Diabetes mellitus without complication ([place])   . Hypertension   . Migraines   . Ulcer     Past Surgical History:  Procedure Laterality Date  . CESAREAN SECTION  1993  . HYSTEROSCOPY WITH NOVASURE N/A 06/02/2016   Procedure: HYSTEROSCOPY WITH NOVASURE;  Surgeon: Paula Compton, MD;  Location: [facility];  Service: Gynecology;  Laterality: N/A;  . TUBAL LIGATION  1993    Current  Facility-Administered Medications  Medication Dose Route Frequency Provider Last Rate Last Admin  . 0.9 %  sodium chloride infusion   Intravenous Continuous Thornton Park, MD        Allergies as of 06/09/2019 - Review Complete 05/23/2019  Allergen Reaction Noted  . Iodine Anaphylaxis 04/10/2019  . Other Anaphylaxis 10/19/2017  . Shellfish allergy Anaphylaxis 05/23/2016  . Codeine Itching 12/11/2011  . Menthol Hives 05/23/2016  . Morphine and related Itching 12/11/2011  . Penicillins Swelling 12/11/2011  . Sudafed [pseudoephedrine hcl] Swelling 12/11/2011  . Azithromycin Rash 12/11/2011  . Erythromycin Rash 12/11/2011    Family History  Problem Relation Age of Onset  . Diabetes Mother   . Hypertension Mother   . Heart disease Mother   . Hyperlipidemia Mother   . Diabetes Father   . Hypertension Father   . Heart disease Father   . Hyperlipidemia Father   . Crohn's disease Father   . Cervical cancer Sister   . Heart disease Sister   . Diabetes Sister   . Heart disease Sister   . Diabetes Sister   . Colon polyps Neg Hx   . Esophageal cancer Neg Hx   . Stomach cancer Neg Hx   . Rectal cancer Neg Hx     Social History   Socioeconomic History  . Marital status: Married    Spouse name: Not on file  . Number of children: Not on file  . Years of education: 56  . Highest education level: Not on file  Occupational History  . Occupation: Product manager: [employer]  Tobacco Use  . Smoking status: Never Smoker  . Smokeless tobacco: Never Used  Vaping Use  . Vaping Use: Never used  Substance and Sexual Activity  . Alcohol use: Not Currently    Comment: occ  . Drug use: No  . Sexual activity: Yes    Birth control/protection: Surgical  Other Topics Concern  . Not on file  Social History Narrative   Regular exercise-yes   Caffeine Use-no   Social Determinants of Health   Financial Resource Strain:   . Difficulty of Paying Living Expenses:     Food Insecurity:   . Worried About Charity fundraiser in the Last Year:   . Arboriculturist in the Last Year:   Transportation Needs:   . Film/video editor (Medical):   Marland Kitchen Lack of Transportation (Non-Medical):   Physical Activity:   . Days of Exercise per Week:   . Minutes of Exercise per Session:   Stress:   . Feeling of Stress :   Social Connections:   . Frequency of Communication with Friends and Family:   . Frequency of Social Gatherings with Friends and Family:   . Attends Religious Services:   . Active Member of Clubs or Organizations:   . Attends Archivist Meetings:   Marland Kitchen Marital Status:   Intimate Partner Violence:   . Fear of Current or Ex-Partner:   . Emotionally Abused:   Marland Kitchen Physically Abused:   . Sexually Abused:      Physical Exam: General:   Alert,  well-nourished, pleasant and cooperative in NAD Head:  Normocephalic and atraumatic. Eyes:  Sclera clear, no icterus.   Conjunctiva pink. Ears:  Normal auditory acuity. Nose:  No deformity, discharge,  or lesions. Mouth:  No deformity or lesions.   Neck:  Supple; no masses or thyromegaly. Lungs:  Clear throughout to auscultation.   No wheezes. Heart:  Regular rate and rhythm; no murmurs. Abdomen:  Soft, central obesity, nontender, nondistended, normal bowel sounds, no rebound or guarding. No hepatosplenomegaly.   Rectal:  Deferred  Msk:  Symmetrical. No boney deformities LAD: No inguinal or umbilical LAD Extremities:  No clubbing or edema. Neurologic:  Alert and  oriented x4;  grossly nonfocal Skin:  Intact without significant lesions or rashes. Psych:  Alert and cooperative. Normal mood and affect.    Kazuo Durnil L. Tarri Glenn, MD, MPH 08/08/2019, 8:25 AM

## 2019-08-08 NOTE — Anesthesia Postprocedure Evaluation (Signed)
Anesthesia Post Note  Patient: Lynn Fields  Procedure(s) Performed: COLONOSCOPY WITH PROPOFOL (N/A )     Patient location during evaluation: Endoscopy Anesthesia Type: MAC Level of consciousness: awake Pain management: pain level controlled Vital Signs Assessment: post-procedure vital signs reviewed and stable Respiratory status: spontaneous breathing Cardiovascular status: stable Postop Assessment: no apparent nausea or vomiting Anesthetic complications: no   No complications documented.  Last Vitals:  Vitals:   08/08/19 1007 08/08/19 1015  BP: (!) 156/83 (!) 158/90  Pulse: 77 76  Resp: (!) 25 17  Temp: 36.7 C   SpO2: 100% 100%    Last Pain:  Vitals:   08/08/19 1025  TempSrc:   PainSc: 0-No pain   Pain Goal:                   Huston Foley

## 2019-08-08 NOTE — Discharge Instructions (Signed)

## 2019-08-08 NOTE — Anesthesia Preprocedure Evaluation (Signed)
Anesthesia Evaluation  Patient identified by MRN, date of birth, ID band Patient awake    Reviewed: Allergy & Precautions, NPO status , Patient's Chart, lab work & pertinent test results  Airway Mallampati: I       Dental no notable dental hx.    Pulmonary    Pulmonary exam normal breath sounds clear to auscultation       Cardiovascular hypertension, Pt. on medications Normal cardiovascular exam Rhythm:Regular Rate:Normal     Neuro/Psych    GI/Hepatic negative GI ROS, Neg liver ROS,   Endo/Other  diabetes, Poorly Controlled, Type 2, Oral Hypoglycemic AgentsMorbid obesity  Renal/GU negative Renal ROS     Musculoskeletal   Abdominal (+) + obese,   Peds  Hematology   Anesthesia Other Findings   Reproductive/Obstetrics                             Anesthesia Physical  Anesthesia Plan  ASA: III  Anesthesia Plan: MAC   Post-op Pain Management:    Induction:   PONV Risk Score and Plan:   Airway Management Planned: Natural Airway, Simple Face Mask and Nasal Cannula  Additional Equipment:   Intra-op Plan:   Post-operative Plan:   Informed Consent: I have reviewed the patients History and Physical, chart, labs and discussed the procedure including the risks, benefits and alternatives for the proposed anesthesia with the patient or authorized representative who has indicated his/her understanding and acceptance.       Plan Discussed with: CRNA  Anesthesia Plan Comments:         Anesthesia Quick Evaluation

## 2019-08-09 ENCOUNTER — Encounter (HOSPITAL_COMMUNITY): Payer: Self-pay | Admitting: Gastroenterology

## 2019-08-16 ENCOUNTER — Ambulatory Visit
Admission: EM | Admit: 2019-08-16 | Discharge: 2019-08-16 | Disposition: A | Discharge status: Home / Self Care | Payer: BC Managed Care – PPO | Attending: Physician Assistant | Admitting: Physician Assistant

## 2019-08-16 DIAGNOSIS — M79672 Pain in left foot: Secondary | ICD-10-CM

## 2019-08-16 MED ORDER — DICLOFENAC SODIUM 1 % EX GEL
2.0000 g | Freq: Four times a day (QID) | CUTANEOUS | 0 refills | Status: DC
Start: 2019-08-16 — End: 2021-05-28

## 2019-08-16 NOTE — Discharge Instructions (Signed)
Restart mobic daily for 5-10 days. Voltaren gel as needed. Ice compress, elevation, rest, ace wrap during activity. Follow up with sports medicine if symptoms not improving.

## 2019-08-16 NOTE — ED Triage Notes (Addendum)
Patient is here today with complaints of left foot pain (bottom) that began a couple of weeks ago without any injury to the foot. Patient states when she stands on her feet the left foot begins to hurt on the bottom. Patient states it feels like there is no cushion on the bottom, just skin and bone. Patient states she has not seen an orthopedic for the pain. Patient states she has tried soaking the left foot with no relief.  Patient denies: falling, injury

## 2019-08-16 NOTE — ED Provider Notes (Signed)
[facility]    CSN: [id number] Arrival date & time: 08/16/19  1437      History   Chief Complaint Chief Complaint  Patient presents with  . Foot Pain    HPI Lynn Fields is a 49 y.o. female.   49 year old female with history of DM, HTN comes in for few week history of left foot pain. Denies injury/trauma. Pain is to the lateral bottom foot without swelling, erythema, warmth. No pain at rest, pain with weightbearing/ROM. Denies numbness/tingling. Tylenol without relief.      Past Medical History:  Diagnosis Date  . Allergy   . Arthritis    knees  . Depression   . Diabetes mellitus without complication ([place])   . Hypertension   . Migraines   . Ulcer     Patient Active Problem List   Diagnosis Date Noted  . Family history of colon cancer   . Unilateral primary osteoarthritis, left knee 11/17/2018  . Unilateral primary osteoarthritis, right knee 11/17/2018  . Vitamin D deficiency 10/26/2018  . Fatty liver 10/05/2018  . Hot flashes 08/24/2018  . Poor appetite 07/27/2018  . Chronic headaches 07/27/2018  . Right hand pain 07/06/2018  . Hyperlipidemia associated with type 2 diabetes mellitus ([place]) 08/10/2017  . Allergic reaction to food 02/04/2017  . Adjustment disorder with mixed anxiety and depressed mood 08/07/2016  . Dysmenorrhea 04/15/2016  . Menorrhagia 04/15/2016  . Degenerative arthritis of knee, bilateral 02/11/2016  . Routine general medical examination at a health care facility 08/16/2015  . Essential hypertension 06/14/2012  . Uncontrolled diabetes mellitus ([place]) 06/14/2012  . Obesity, morbid ([place]) 06/14/2012    Past Surgical History:  Procedure Laterality Date  . CESAREAN SECTION  1993  . COLONOSCOPY WITH PROPOFOL N/A 08/08/2019   Procedure: COLONOSCOPY WITH PROPOFOL;  Surgeon: Thornton Park, MD;  Location: [facility];  Service: Gastroenterology;  Laterality: N/A;  . HYSTEROSCOPY WITH NOVASURE N/A 06/02/2016   Procedure:  HYSTEROSCOPY WITH NOVASURE;  Surgeon: Paula Compton, MD;  Location: [facility];  Service: Gynecology;  Laterality: N/A;  . TUBAL LIGATION  1993    OB History   No obstetric history on file.      Home Medications    Prior to Admission medications   Medication Sig Start Date End Date Taking? Authorizing Provider  meloxicam (MOBIC) 15 MG tablet Take 1 tablet (15 mg total) by mouth daily. 04/28/19  Yes Isaac Bliss, Rayford Halsted, MD  montelukast (SINGULAIR) 10 MG tablet Take 1 tablet (10 mg total) by mouth at bedtime. 04/28/19  Yes Isaac Bliss, Rayford Halsted, MD  acetaminophen (TYLENOL) 500 MG tablet Take 1,000 mg by mouth every 6 (six) hours as needed for headache.    [provider]  atorvastatin (LIPITOR) 40 MG tablet TAKE 1 TABLET BY MOUTH ONCE DAILY AT  6  PM Patient taking differently: Take 40 mg by mouth daily. TAKE 1 TABLET BY MOUTH ONCE DAILY AT  6  PM 04/28/19   Isaac Bliss, Rayford Halsted, MD  buPROPion (WELLBUTRIN XL) 300 MG 24 hr tablet Take 1 tablet (300 mg total) by mouth daily. 04/28/19   Isaac Bliss, Rayford Halsted, MD  diclofenac Sodium (VOLTAREN) 1 % GEL Apply 2 g topically 4 (four) times daily. 08/16/19   Tasia Catchings, Jaslyn Bansal V, PA-C  EPINEPHrine (EPIPEN JR) 0.15 MG/0.3ML injection Inject 0.6 mLs (0.3 mg total) into the muscle once as needed for up to 1 dose for anaphylaxis. 10/26/18   Isaac Bliss, Rayford Halsted, MD  hydrochlorothiazide (  HYDRODIURIL) 25 MG tablet Take 1 tablet (25 mg total) by mouth daily. 04/28/19   Isaac Bliss, Rayford Halsted, MD  losartan (COZAAR) 50 MG tablet Take 1 tablet (50 mg total) by mouth daily. 04/28/19   Isaac Bliss, Rayford Halsted, MD  metFORMIN (GLUCOPHAGE) 1000 MG tablet Take 1 tablet (1,000 mg total) by mouth 2 (two) times daily with a meal. 04/28/19   Isaac Bliss, Rayford Halsted, MD  Multiple Vitamins-Minerals (MULTIVITAMIN WITH MINERALS) tablet Take 1 tablet by mouth daily.    [provider]  ondansetron (ZOFRAN ODT) 4 MG disintegrating tablet Take 1  tablet (4 mg total) by mouth every 8 (eight) hours as needed for nausea or vomiting. 04/28/19   Isaac Bliss, Rayford Halsted, MD  SUMAtriptan (IMITREX) 50 MG tablet Take 1 tablet (50 mg total) by mouth once as needed for up to 1 dose for migraine. May repeat in 2 hours if headache persists or recurs. 04/28/19   Isaac Bliss, Rayford Halsted, MD  tiZANidine (ZANAFLEX) 4 MG tablet Take 1 tablet (4 mg total) by mouth at bedtime. 06/10/19   Lyndal Pulley, DO    Family History Family History  Problem Relation Age of Onset  . Diabetes Mother   . Hypertension Mother   . Heart disease Mother   . Hyperlipidemia Mother   . Diabetes Father   . Hypertension Father   . Heart disease Father   . Hyperlipidemia Father   . Crohn's disease Father   . Cervical cancer Sister   . Heart disease Sister   . Diabetes Sister   . Heart disease Sister   . Diabetes Sister   . Colon polyps Neg Hx   . Esophageal cancer Neg Hx   . Stomach cancer Neg Hx   . Rectal cancer Neg Hx     Social History Social History   Tobacco Use  . Smoking status: Never Smoker  . Smokeless tobacco: Never Used  Vaping Use  . Vaping Use: Never used  Substance Use Topics  . Alcohol use: Not Currently    Comment: occ  . Drug use: No     Allergies   Iodine, Other, Shellfish allergy, Codeine, Menthol, Morphine and related, Penicillins, Sudafed [pseudoephedrine hcl], Azithromycin, and Erythromycin   Review of Systems Review of Systems  Reason unable to perform ROS: See HPI as above.     Physical Exam Triage Vital Signs ED Triage Vitals  Enc Vitals Group     BP 08/16/19 1533 (!) 133/95     Pulse Rate 08/16/19 1533 77     Resp 08/16/19 1533 18     Temp 08/16/19 1533 98.1 F (36.7 C)     Temp Source 08/16/19 1533 Oral     SpO2 08/16/19 1533 100 %     Weight --      Height --      Head Circumference --      Peak Flow --      Pain Score 08/16/19 1540 8     Pain Loc --      Pain Edu? --      Excl. in [place]? --    No  data found.  Updated Vital Signs BP (!) 133/95 (BP Location: Left Arm)   Pulse 77   Temp 98.1 F (36.7 C) (Oral)   Resp 18   SpO2 100%   Physical Exam Constitutional:      General: She is not in acute distress.    Appearance: Normal appearance. She is well-developed.  She is not toxic-appearing or diaphoretic.  HENT:     Head: Normocephalic and atraumatic.  Eyes:     Conjunctiva/sclera: Conjunctivae normal.     Pupils: Pupils are equal, round, and reactive to light.  Pulmonary:     Effort: Pulmonary effort is normal. No respiratory distress.  Musculoskeletal:     Cervical back: Normal range of motion and neck supple.     Comments: No swelling, erythema, warmth, contusion.  No open wounds.  Tenderness to palpation along distal fifth MTP.  Full range of motion of ankle and toes NVI  Skin:    General: Skin is warm and dry.  Neurological:     Mental Status: She is alert and oriented to person, place, and time.      UC Treatments / Results  Labs (all labs ordered are listed, but only abnormal results are displayed) Labs Reviewed - No data to display  EKG   Radiology No results found.  Procedures Procedures (including critical care time)  Medications Ordered in UC Medications - No data to display  Initial Impression / Assessment and Plan / UC Course  I have reviewed the triage vital signs and the nursing notes.  Pertinent labs & imaging results that were available during my care of the patient were reviewed by me and considered in my medical decision making (see chart for details).    Atraumatic left foot pain without open wound/erythema/warmth. Likely inflammatory in nature. Patient to restart mobic. Other symptomatic treatment discussed. To follow up with sports medicine if symptoms not improving.  Final Clinical Impressions(s) / UC Diagnoses   Final diagnoses:  Left foot pain    ED Prescriptions    Medication Sig Dispense Auth. Provider   diclofenac Sodium  (VOLTAREN) 1 % GEL Apply 2 g topically 4 (four) times daily. 100 g Ok Edwards, PA-C     PDMP not reviewed this encounter.   Ok Edwards, PA-C 08/16/19 1629

## 2019-08-30 ENCOUNTER — Encounter: Payer: Self-pay | Admitting: Family Medicine

## 2019-08-30 ENCOUNTER — Other Ambulatory Visit: Payer: Self-pay

## 2019-08-30 ENCOUNTER — Encounter: Payer: Self-pay | Admitting: Internal Medicine

## 2019-08-30 ENCOUNTER — Ambulatory Visit: Payer: BC Managed Care – PPO | Admitting: Internal Medicine

## 2019-08-30 VITALS — BP 110/80 | HR 69 | Temp 98.5°F | Wt 258.3 lb

## 2019-08-30 DIAGNOSIS — I10 Essential (primary) hypertension: Secondary | ICD-10-CM | POA: Diagnosis not present

## 2019-08-30 DIAGNOSIS — E559 Vitamin D deficiency, unspecified: Secondary | ICD-10-CM

## 2019-08-30 DIAGNOSIS — D1803 Hemangioma of intra-abdominal structures: Secondary | ICD-10-CM

## 2019-08-30 DIAGNOSIS — R413 Other amnesia: Secondary | ICD-10-CM | POA: Diagnosis not present

## 2019-08-30 DIAGNOSIS — E11649 Type 2 diabetes mellitus with hypoglycemia without coma: Secondary | ICD-10-CM | POA: Diagnosis not present

## 2019-08-30 LAB — POCT GLYCOSYLATED HEMOGLOBIN (HGB A1C): Hemoglobin A1C: 6.2 % — AB (ref 4.0–5.6)

## 2019-08-30 NOTE — Patient Instructions (Signed)
-  Nice seeing you today!!  -Lab work today; will notify you once results are available.  -Liver MRI has been ordered.  -Schedule follow up in 3 months.

## 2019-08-30 NOTE — Progress Notes (Signed)
Established Patient Office Visit     This visit occurred during the SARS-CoV-2 public health emergency.  Safety protocols were in place, including screening questions prior to the visit, additional usage of staff PPE, and extensive cleaning of exam room while observing appropriate contact time as indicated for disinfecting solutions.    CC/Reason for Visit: Follow-up diabetes, discuss some acute concerns  HPI: Lynn Fields is a 49 y.o. female who is coming in today for the above mentioned reasons.  She is here today to follow-up on her diabetes.  At last visit she was having significant hypoglycemia and so she was taken off the glipizide.  She tells me that she has had no further lows, her lowest CBG has been 126.  Interestingly her A1c has improved from 7.3-6.2.  She had an MRI of the lumbar in August 2020 at [facility] due to some liver lesions.  There were thought to be benign hemangiomas, and a 64-month follow-up MRI was recommended and she is requesting this today.  She also continues to have issues with memory and her hand shaking.  Both her husband and her coworkers have commented on her asking the same question multiple times within a short period of time.   Past Medical/Surgical History: Past Medical History:  Diagnosis Date  . Allergy   . Arthritis    knees  . Depression   . Diabetes mellitus without complication ([place])   . Hypertension   . Migraines   . Ulcer     Past Surgical History:  Procedure Laterality Date  . CESAREAN SECTION  1993  . COLONOSCOPY WITH PROPOFOL N/A 08/08/2019   Procedure: COLONOSCOPY WITH PROPOFOL;  Surgeon: Thornton Park, MD;  Location: [facility];  Service: Gastroenterology;  Laterality: N/A;  . HYSTEROSCOPY WITH NOVASURE N/A 06/02/2016   Procedure: HYSTEROSCOPY WITH NOVASURE;  Surgeon: Paula Compton, MD;  Location: [facility];  Service: Gynecology;  Laterality: N/A;  . TUBAL LIGATION  1993    Social History:  reports that she  has never smoked. She has never used smokeless tobacco. She reports previous alcohol use. She reports that she does not use drugs.  Allergies: Allergies  Allergen Reactions  . Iodine Anaphylaxis  . Other Anaphylaxis    Hair dye  . Shellfish Allergy Anaphylaxis    Closes throat  . Codeine Itching  . Menthol Hives  . Morphine And Related Itching  . Penicillins Swelling    Has patient had a PCN reaction causing immediate rash, facial/tongue/throat swelling, SOB or lightheadedness with hypotension: Yes Has patient had a PCN reaction causing severe rash involving mucus membranes or skin necrosis: No Has patient had a PCN reaction that required hospitalization No Has patient had a PCN reaction occurring within the last 10 years: No If all of the above answers are "NO", then may proceed with Cephalosporin use.  [place] [Pseudoephedrine Hcl] Swelling  . Azithromycin Rash  . Erythromycin Rash    Family History:  Family History  Problem Relation Age of Onset  . Diabetes Mother   . Hypertension Mother   . Heart disease Mother   . Hyperlipidemia Mother   . Diabetes Father   . Hypertension Father   . Heart disease Father   . Hyperlipidemia Father   . Crohn's disease Father   . Cervical cancer Sister   . Heart disease Sister   . Diabetes Sister   . Heart disease Sister   . Diabetes Sister   . Colon polyps Neg Hx   .  Esophageal cancer Neg Hx   . Stomach cancer Neg Hx   . Rectal cancer Neg Hx      Current Outpatient Medications:  .  acetaminophen (TYLENOL) 500 MG tablet, Take 1,000 mg by mouth every 6 (six) hours as needed for headache., Disp: , Rfl:  .  atorvastatin (LIPITOR) 40 MG tablet, TAKE 1 TABLET BY MOUTH ONCE DAILY AT  6  PM (Patient taking differently: Take 40 mg by mouth daily. TAKE 1 TABLET BY MOUTH ONCE DAILY AT  6  PM), Disp: 90 tablet, Rfl: 1 .  buPROPion (WELLBUTRIN XL) 300 MG 24 hr tablet, Take 1 tablet (300 mg total) by mouth daily., Disp: 90 tablet, Rfl: 1 .   diclofenac Sodium (VOLTAREN) 1 % GEL, Apply 2 g topically 4 (four) times daily., Disp: 100 g, Rfl: 0 .  EPINEPHrine (EPIPEN JR) 0.15 MG/0.3ML injection, Inject 0.6 mLs (0.3 mg total) into the muscle once as needed for up to 1 dose for anaphylaxis., Disp: 2 each, Rfl: 0 .  hydrochlorothiazide (HYDRODIURIL) 25 MG tablet, Take 1 tablet (25 mg total) by mouth daily., Disp: 90 tablet, Rfl: 1 .  losartan (COZAAR) 50 MG tablet, Take 1 tablet (50 mg total) by mouth daily., Disp: 90 tablet, Rfl: 1 .  meloxicam (MOBIC) 15 MG tablet, Take 1 tablet (15 mg total) by mouth daily., Disp: 90 tablet, Rfl: 1 .  metFORMIN (GLUCOPHAGE) 1000 MG tablet, Take 1 tablet (1,000 mg total) by mouth 2 (two) times daily with a meal., Disp: 180 tablet, Rfl: 1 .  montelukast (SINGULAIR) 10 MG tablet, Take 1 tablet (10 mg total) by mouth at bedtime., Disp: 90 tablet, Rfl: 1 .  Multiple Vitamins-Minerals (MULTIVITAMIN WITH MINERALS) tablet, Take 1 tablet by mouth daily., Disp: , Rfl:  .  ondansetron (ZOFRAN ODT) 4 MG disintegrating tablet, Take 1 tablet (4 mg total) by mouth every 8 (eight) hours as needed for nausea or vomiting., Disp: 20 tablet, Rfl: 0 .  SUMAtriptan (IMITREX) 50 MG tablet, Take 1 tablet (50 mg total) by mouth once as needed for up to 1 dose for migraine. May repeat in 2 hours if headache persists or recurs., Disp: 10 tablet, Rfl: 0 .  tiZANidine (ZANAFLEX) 4 MG tablet, Take 1 tablet (4 mg total) by mouth at bedtime., Disp: 30 tablet, Rfl: 0  Review of Systems:  Constitutional: Denies fever, chills, diaphoresis, appetite change and fatigue.  HEENT: Denies photophobia, eye pain, redness, hearing loss, ear pain, congestion, sore throat, rhinorrhea, sneezing, mouth sores, trouble swallowing, neck pain, neck stiffness and tinnitus.   Respiratory: Denies SOB, DOE, cough, chest tightness,  and wheezing.   Cardiovascular: Denies chest pain, palpitations and leg swelling.  Gastrointestinal: Denies nausea, vomiting,  abdominal pain, diarrhea, constipation, blood in stool and abdominal distention.  Genitourinary: Denies dysuria, urgency, frequency, hematuria, flank pain and difficulty urinating.  Endocrine: Denies: hot or cold intolerance, sweats, changes in hair or nails, polyuria, polydipsia. Musculoskeletal: Denies myalgias, back pain, joint swelling, arthralgias and gait problem.  Skin: Denies pallor, rash and wound.  Neurological: Denies dizziness, seizures, syncope, weakness, light-headedness, numbness and headaches.  Hematological: Denies adenopathy. Easy bruising, personal or family bleeding history  Psychiatric/Behavioral: Denies suicidal ideation, mood changes, confusion, nervousness, sleep disturbance and agitation    Physical Exam: Vitals:   08/30/19 1141  BP: 110/80  Pulse: 69  Temp: 98.5 F (36.9 C)  TempSrc: Oral  SpO2: 97%  Weight: 258 lb 4.8 oz (117.2 kg)    Body mass index is 50.45  kg/m.   Constitutional: NAD, calm, comfortable, obese Eyes: PERRL, lids and conjunctivae normal, wears corrective lenses ENMT: Mucous membranes are moist.  Respiratory: clear to auscultation bilaterally, no wheezing, no crackles. Normal respiratory effort. No accessory muscle use.  Cardiovascular: Regular rate and rhythm, no murmurs / rubs / gallops. No extremity edema. .  Neurologic: Grossly intact and nonfocal Psychiatric: Normal judgment and insight. Alert and oriented x 3. Normal mood.    Impression and Plan:  Uncontrolled type 2 diabetes mellitus with hypoglycemia without coma ([place])  -Improved control, she is no longer hypoglycemic. -Her A1c has actually improved to 6.2 today.  Essential hypertension  -Well-controlled.  Obesity, morbid ([place]) -Discussed healthy lifestyle, including increased physical activity and better food choices to promote weight loss.  Memory loss  - Plan: TSH, Vitamin B12, RPR -If above negative, consider referral to neurology for further work-up.  Liver  hemangioma  - Plan: MR LIVER W WO CONTRAST  Vitamin D deficiency  - Plan: VITAMIN D 25 Hydroxy (Vit-D Deficiency, Fractures)    Patient Instructions  -Nice seeing you today!!  -Lab work today; will notify you once results are available.  -Liver MRI has been ordered.  -Schedule follow up in 3 months.     Lelon Frohlich, MD [facility]

## 2019-08-31 ENCOUNTER — Encounter: Payer: Self-pay | Admitting: Internal Medicine

## 2019-08-31 ENCOUNTER — Other Ambulatory Visit: Payer: Self-pay

## 2019-08-31 ENCOUNTER — Ambulatory Visit: Payer: BC Managed Care – PPO

## 2019-08-31 ENCOUNTER — Other Ambulatory Visit: Payer: Self-pay | Admitting: Internal Medicine

## 2019-08-31 ENCOUNTER — Ambulatory Visit: Payer: BC Managed Care – PPO | Admitting: Family Medicine

## 2019-08-31 ENCOUNTER — Ambulatory Visit: Payer: Self-pay

## 2019-08-31 ENCOUNTER — Ambulatory Visit (INDEPENDENT_AMBULATORY_CARE_PROVIDER_SITE_OTHER): Payer: BC Managed Care – PPO

## 2019-08-31 ENCOUNTER — Encounter: Payer: Self-pay | Admitting: Family Medicine

## 2019-08-31 VITALS — BP 132/86 | HR 90 | Ht 60.0 in | Wt 260.0 lb

## 2019-08-31 DIAGNOSIS — M79672 Pain in left foot: Secondary | ICD-10-CM

## 2019-08-31 DIAGNOSIS — E559 Vitamin D deficiency, unspecified: Secondary | ICD-10-CM

## 2019-08-31 LAB — CBC WITH DIFFERENTIAL/PLATELET
Absolute Monocytes: 391 cells/uL (ref 200–950)
Basophils Absolute: 38 cells/uL (ref 0–200)
Basophils Relative: 0.6 %
Eosinophils Absolute: 101 cells/uL (ref 15–500)
Eosinophils Relative: 1.6 %
HCT: 38 % (ref 35.0–45.0)
Hemoglobin: 13 g/dL (ref 11.7–15.5)
Lymphs Abs: 1814 cells/uL (ref 850–3900)
MCH: 29.6 pg (ref 27.0–33.0)
MCHC: 34.2 g/dL (ref 32.0–36.0)
MCV: 86.6 fL (ref 80.0–100.0)
MPV: 10.5 fL (ref 7.5–12.5)
Monocytes Relative: 6.2 %
Neutro Abs: 3956 cells/uL (ref 1500–7800)
Neutrophils Relative %: 62.8 %
Platelets: 286 10*3/uL (ref 140–400)
RBC: 4.39 10*6/uL (ref 3.80–5.10)
RDW: 13 % (ref 11.0–15.0)
Total Lymphocyte: 28.8 %
WBC: 6.3 10*3/uL (ref 3.8–10.8)

## 2019-08-31 LAB — TSH: TSH: 0.96 mIU/L

## 2019-08-31 LAB — COMPREHENSIVE METABOLIC PANEL
AG Ratio: 1.5 (calc) (ref 1.0–2.5)
ALT: 12 U/L (ref 6–29)
AST: 13 U/L (ref 10–35)
Albumin: 3.6 g/dL (ref 3.6–5.1)
Alkaline phosphatase (APISO): 60 U/L (ref 31–125)
BUN: 12 mg/dL (ref 7–25)
CO2: 29 mmol/L (ref 20–32)
Calcium: 8.9 mg/dL (ref 8.6–10.2)
Chloride: 101 mmol/L (ref 98–110)
Creat: 0.92 mg/dL (ref 0.50–1.10)
Globulin: 2.4 g/dL (calc) (ref 1.9–3.7)
Glucose, Bld: 109 mg/dL — ABNORMAL HIGH (ref 65–99)
Potassium: 3.7 mmol/L (ref 3.5–5.3)
Sodium: 138 mmol/L (ref 135–146)
Total Bilirubin: 0.6 mg/dL (ref 0.2–1.2)
Total Protein: 6 g/dL — ABNORMAL LOW (ref 6.1–8.1)

## 2019-08-31 LAB — RPR: RPR Ser Ql: NONREACTIVE

## 2019-08-31 LAB — VITAMIN B12: Vitamin B-12: 450 pg/mL (ref 200–1100)

## 2019-08-31 LAB — VITAMIN D 25 HYDROXY (VIT D DEFICIENCY, FRACTURES): Vit D, 25-Hydroxy: 20 ng/mL — ABNORMAL LOW (ref 30–100)

## 2019-08-31 MED ORDER — VITAMIN D (ERGOCALCIFEROL) 1.25 MG (50000 UNIT) PO CAPS: 50000.0000 [IU] | ORAL_CAPSULE | ORAL | 0 refills | Status: AC

## 2019-08-31 NOTE — Assessment & Plan Note (Signed)
Left foot exam shows the patient does have what appears to be more of a soft tissue swelling noted.  No cortical irregularity of the foot noted.  Postop boot given today.  Warned that this may take some time to fully heal.  Is able to do activity in the boot better.  Follow-up with me again 2 to 4 weeks

## 2019-08-31 NOTE — Patient Instructions (Signed)
Post op shoe  See me at next appt August 23rd

## 2019-08-31 NOTE — Progress Notes (Signed)
[place] Phone: [phone] Subjective:   Lynn Fields, am serving as a scribe for Dr. Hulan Saas. This visit occurred during the SARS-CoV-2 public health emergency.  Safety protocols were in place, including screening questions prior to the visit, additional usage of staff PPE, and extensive cleaning of exam room while observing appropriate contact time as indicated for disinfecting solutions.   I'm seeing this patient by the request  of:  Isaac Bliss, Rayford Halsted, MD  CC: Left foot pain  [id number]  Lynn Fields is a 49 y.o. female coming in with complaint of left foot pain for 3 weeks. Hit fifth metatarsal on metal leg of bed. Patient's pain has been increasing from a dull ache to more sharp pain. Pain is constant.  Patient states it does not matter if she puts pressure on it or not but worse with pressure on it.  Seems to be more on the lateral and bottom part of the foot than the top.  Points more towards the toes.    Past Medical History:  Diagnosis Date  . Allergy   . Arthritis    knees  . Depression   . Diabetes mellitus without complication ([place])   . Hypertension   . Migraines   . Ulcer    Past Surgical History:  Procedure Laterality Date  . CESAREAN SECTION  1993  . COLONOSCOPY WITH PROPOFOL N/A 08/08/2019   Procedure: COLONOSCOPY WITH PROPOFOL;  Surgeon: Thornton Park, MD;  Location: [facility];  Service: Gastroenterology;  Laterality: N/A;  . HYSTEROSCOPY WITH NOVASURE N/A 06/02/2016   Procedure: HYSTEROSCOPY WITH NOVASURE;  Surgeon: Paula Compton, MD;  Location: [facility];  Service: Gynecology;  Laterality: N/A;  . TUBAL LIGATION  1993   Social History   Socioeconomic History  . Marital status: Married    Spouse name: Not on file  . Number of children: Not on file  . Years of education: 34  . Highest education level: Not on file  Occupational History  . Occupation:  Product manager: [employer]  Tobacco Use  . Smoking status: Never Smoker  . Smokeless tobacco: Never Used  Vaping Use  . Vaping Use: Never used  Substance and Sexual Activity  . Alcohol use: Not Currently    Comment: occ  . Drug use: Fields  . Sexual activity: Yes    Birth control/protection: Surgical  Other Topics Concern  . Not on file  Social History Narrative   Regular exercise-yes   Caffeine Use-Fields   Social Determinants of Health   Financial Resource Strain:   . Difficulty of Paying Living Expenses:   Food Insecurity:   . Worried About Charity fundraiser in the Last Year:   . Arboriculturist in the Last Year:   Transportation Needs:   . Film/video editor (Medical):   Marland Kitchen Lack of Transportation (Non-Medical):   Physical Activity:   . Days of Exercise per Week:   . Minutes of Exercise per Session:   Stress:   . Feeling of Stress :   Social Connections:   . Frequency of Communication with Friends and Family:   . Frequency of Social Gatherings with Friends and Family:   . Attends Religious Services:   . Active Member of Clubs or Organizations:   . Attends Archivist Meetings:   Marland Kitchen Marital Status:    Allergies  Allergen Reactions  . Iodine Anaphylaxis  .  Other Anaphylaxis    Hair dye  . Shellfish Allergy Anaphylaxis    Closes throat  . Codeine Itching  . Menthol Hives  . Morphine And Related Itching  . Penicillins Swelling    Has patient had a PCN reaction causing immediate rash, facial/tongue/throat swelling, SOB or lightheadedness with hypotension: Yes Has patient had a PCN reaction causing severe rash involving mucus membranes or skin necrosis: Fields Has patient had a PCN reaction that required hospitalization Fields Has patient had a PCN reaction occurring within the last 10 years: Fields If all of the above answers are "Fields", then may proceed with Cephalosporin use.  Ebbie Ridge [Pseudoephedrine Hcl] Swelling  . Azithromycin Rash  .  Erythromycin Rash   Family History  Problem Relation Age of Onset  . Diabetes Mother   . Hypertension Mother   . Heart disease Mother   . Hyperlipidemia Mother   . Diabetes Father   . Hypertension Father   . Heart disease Father   . Hyperlipidemia Father   . Crohn's disease Father   . Cervical cancer Sister   . Heart disease Sister   . Diabetes Sister   . Heart disease Sister   . Diabetes Sister   . Colon polyps Neg Hx   . Esophageal cancer Neg Hx   . Stomach cancer Neg Hx   . Rectal cancer Neg Hx     Current Outpatient Medications (Endocrine & Metabolic):  .  metFORMIN (GLUCOPHAGE) 1000 MG tablet, Take 1 tablet (1,000 mg total) by mouth 2 (two) times daily with a meal.  Current Outpatient Medications (Cardiovascular):  .  atorvastatin (LIPITOR) 40 MG tablet, TAKE 1 TABLET BY MOUTH ONCE DAILY AT  6  PM (Patient taking differently: Take 40 mg by mouth daily. TAKE 1 TABLET BY MOUTH ONCE DAILY AT  6  PM) .  EPINEPHrine (EPIPEN JR) 0.15 MG/0.3ML injection, Inject 0.6 mLs (0.3 mg total) into the muscle once as needed for up to 1 dose for anaphylaxis. .  hydrochlorothiazide (HYDRODIURIL) 25 MG tablet, Take 1 tablet (25 mg total) by mouth daily. Marland Kitchen  losartan (COZAAR) 50 MG tablet, Take 1 tablet (50 mg total) by mouth daily.  Current Outpatient Medications (Respiratory):  .  montelukast (SINGULAIR) 10 MG tablet, Take 1 tablet (10 mg total) by mouth at bedtime.  Current Outpatient Medications (Analgesics):  .  acetaminophen (TYLENOL) 500 MG tablet, Take 1,000 mg by mouth every 6 (six) hours as needed for headache. .  meloxicam (MOBIC) 15 MG tablet, Take 1 tablet (15 mg total) by mouth daily. .  SUMAtriptan (IMITREX) 50 MG tablet, Take 1 tablet (50 mg total) by mouth once as needed for up to 1 dose for migraine. May repeat in 2 hours if headache persists or recurs.   Current Outpatient Medications (Other):  Marland Kitchen  buPROPion (WELLBUTRIN XL) 300 MG 24 hr tablet, Take 1 tablet (300 mg total)  by mouth daily. .  diclofenac Sodium (VOLTAREN) 1 % GEL, Apply 2 g topically 4 (four) times daily. .  Multiple Vitamins-Minerals (MULTIVITAMIN WITH MINERALS) tablet, Take 1 tablet by mouth daily. .  ondansetron (ZOFRAN ODT) 4 MG disintegrating tablet, Take 1 tablet (4 mg total) by mouth every 8 (eight) hours as needed for nausea or vomiting. Marland Kitchen  tiZANidine (ZANAFLEX) 4 MG tablet, Take 1 tablet (4 mg total) by mouth at bedtime. .  Vitamin D, Ergocalciferol, (DRISDOL) 1.25 MG (50000 UNIT) CAPS capsule, Take 1 capsule (50,000 Units total) by mouth every 7 (seven) days for  12 doses.   Reviewed prior external information including notes and imaging from  primary care provider As well as notes that were available from care everywhere and other healthcare systems.  Past medical history, social, surgical and family history all reviewed in electronic medical record.  Fields pertanent information unless stated regarding to the chief complaint.   Review of Systems:  Fields headache, visual changes, nausea, vomiting, diarrhea, constipation, dizziness, abdominal pain, skin rash, fevers, chills, night sweats, weight loss, swollen lymph nodes, body aches, joint swelling, chest pain, shortness of breath, mood changes. POSITIVE muscle aches  Objective  Blood pressure 132/86, pulse 90, height 5' (1.524 m), weight 260 lb (117.9 kg), SpO2 98 %.   General: Fields apparent distress alert and oriented x3 mood and affect normal, dressed appropriately.  HEENT: Pupils equal, extraocular movements intact   Severely antalgic.  Only focused on patient's foot.  Fields swelling noted specifically.  Patient was severely tender to palpation over the lateral aspect of the fifth metatarsal distally.  Not quite to the phalanx.  Patient did have x-rays of the foot done that were independently visualized by me showing Fields bony abnormality.  Limited musculoskeletal ultrasound was performed and interpreted by Lyndal Pulley  Limited  ultrasound shows the patient's foot does have what appears to be soft tissue swelling noted.  It is compressible but it seems to be in the subcutaneous tissue.  Fields cortical irregularity noted.  Does not appear to be communicating with any of the joints Impression: Soft tissue contusion   Impression and Recommendations:     The above documentation has been reviewed and is accurate and complete Lyndal Pulley, DO       Note: This dictation was prepared with Dragon dictation along with smaller phrase technology. Any transcriptional errors that result from this process are unintentional.

## 2019-09-19 ENCOUNTER — Encounter: Payer: Self-pay | Admitting: Family Medicine

## 2019-09-19 ENCOUNTER — Other Ambulatory Visit: Payer: Self-pay

## 2019-09-19 ENCOUNTER — Ambulatory Visit: Payer: BC Managed Care – PPO | Admitting: Family Medicine

## 2019-09-19 DIAGNOSIS — M79672 Pain in left foot: Secondary | ICD-10-CM

## 2019-09-19 DIAGNOSIS — M170 Bilateral primary osteoarthritis of knee: Secondary | ICD-10-CM

## 2019-09-19 NOTE — Progress Notes (Signed)
[place] [address] [place] Phone: [phone] Subjective:   I Lynn Fields am serving as a Education administrator for Dr. Hulan Saas.  This visit occurred during the SARS-CoV-2 public health emergency.  Safety protocols were in place, including screening questions prior to the visit, additional usage of staff PPE, and extensive cleaning of exam room while observing appropriate contact time as indicated for disinfecting solutions.   I'm seeing this patient by the request  of:  Isaac Bliss, Rayford Halsted, MD  CC: Bilateral knee pain  [id number]   08/31/2019 Left foot exam shows the patient does have what appears to be more of a soft tissue swelling noted.  No cortical irregularity of the foot noted.  Postop boot given today.  Warned that this may take some time to fully heal.  Is able to do activity in the boot better.  Follow-up with me again 2 to 4 weeks  Update 09/19/2019 Lynn Fields is a 49 y.o. female coming in with complaint of bilateral knee and left foot pain. Patient states she would like knee injections. States she still can't wear a tennis shoe on the left side. States she feels like she is standing on something sharp.  Patient states that it is only in the lateral aspect of the foot.  Feels like the rest of the foot is seems to be doing much better though.    Past Medical History:  Diagnosis Date  . Allergy   . Arthritis    knees  . Depression   . Diabetes mellitus without complication ([place])   . Hypertension   . Migraines   . Ulcer    Past Surgical History:  Procedure Laterality Date  . CESAREAN SECTION  1993  . COLONOSCOPY WITH PROPOFOL N/A 08/08/2019   Procedure: COLONOSCOPY WITH PROPOFOL;  Surgeon: Thornton Park, MD;  Location: [facility];  Service: Gastroenterology;  Laterality: N/A;  . HYSTEROSCOPY WITH NOVASURE N/A 06/02/2016   Procedure: HYSTEROSCOPY WITH NOVASURE;  Surgeon: Paula Compton, MD;  Location: [facility];  Service: Gynecology;  Laterality: N/A;  . TUBAL LIGATION  1993   Social History   Socioeconomic History  . Marital status: Married    Spouse name: Not on file  . Number of children: Not on file  . Years of education: 73  . Highest education level: Not on file  Occupational History  . Occupation: Product manager: [employer]  Tobacco Use  . Smoking status: Never Smoker  . Smokeless tobacco: Never Used  Vaping Use  . Vaping Use: Never used  Substance and Sexual Activity  . Alcohol use: Not Currently    Comment: occ  . Drug use: No  . Sexual activity: Yes    Birth control/protection: Surgical  Other Topics Concern  . Not on file  Social History Narrative   Regular exercise-yes   Caffeine Use-no   Social Determinants of Health   Financial Resource Strain:   . Difficulty of Paying Living Expenses: Not on file  Food Insecurity:   . Worried About Charity fundraiser in the Last Year: Not on file  . Ran Out of Food in the Last Year: Not on file  Transportation Needs:   . Lack of Transportation (Medical): Not on file  . Lack of Transportation (Non-Medical): Not on file  Physical Activity:   . Days of Exercise per Week: Not on file  . Minutes of Exercise per Session: Not on file  Stress:   . Feeling of Stress : Not on file  Social Connections:   . Frequency of Communication with Friends and Family: Not on file  . Frequency of Social Gatherings with Friends and Family: Not on file  . Attends Religious Services: Not on file  . Active Member of Clubs or Organizations: Not on file  . Attends Archivist Meetings: Not on file  . Marital Status: Not on file   Allergies  Allergen Reactions  . Iodine Anaphylaxis  . Other Anaphylaxis    Hair dye  . Shellfish Allergy Anaphylaxis    Closes throat  . Codeine Itching  . Menthol Hives  . Morphine And Related Itching  . Penicillins Swelling    Has patient had a PCN reaction causing immediate  rash, facial/tongue/throat swelling, SOB or lightheadedness with hypotension: Yes Has patient had a PCN reaction causing severe rash involving mucus membranes or skin necrosis: No Has patient had a PCN reaction that required hospitalization No Has patient had a PCN reaction occurring within the last 10 years: No If all of the above answers are "NO", then may proceed with Cephalosporin use.  Ebbie Ridge [Pseudoephedrine Hcl] Swelling  . Azithromycin Rash  . Erythromycin Rash   Family History  Problem Relation Age of Onset  . Diabetes Mother   . Hypertension Mother   . Heart disease Mother   . Hyperlipidemia Mother   . Diabetes Father   . Hypertension Father   . Heart disease Father   . Hyperlipidemia Father   . Crohn's disease Father   . Cervical cancer Sister   . Heart disease Sister   . Diabetes Sister   . Heart disease Sister   . Diabetes Sister   . Colon polyps Neg Hx   . Esophageal cancer Neg Hx   . Stomach cancer Neg Hx   . Rectal cancer Neg Hx     Current Outpatient Medications (Endocrine & Metabolic):  .  metFORMIN (GLUCOPHAGE) 1000 MG tablet, Take 1 tablet (1,000 mg total) by mouth 2 (two) times daily with a meal.  Current Outpatient Medications (Cardiovascular):  .  atorvastatin (LIPITOR) 40 MG tablet, TAKE 1 TABLET BY MOUTH ONCE DAILY AT  6  PM (Patient taking differently: Take 40 mg by mouth daily. TAKE 1 TABLET BY MOUTH ONCE DAILY AT  6  PM) .  EPINEPHrine (EPIPEN JR) 0.15 MG/0.3ML injection, Inject 0.6 mLs (0.3 mg total) into the muscle once as needed for up to 1 dose for anaphylaxis. .  hydrochlorothiazide (HYDRODIURIL) 25 MG tablet, Take 1 tablet (25 mg total) by mouth daily. Marland Kitchen  losartan (COZAAR) 50 MG tablet, Take 1 tablet (50 mg total) by mouth daily.  Current Outpatient Medications (Respiratory):  .  montelukast (SINGULAIR) 10 MG tablet, Take 1 tablet (10 mg total) by mouth at bedtime.  Current Outpatient Medications (Analgesics):  .  acetaminophen  (TYLENOL) 500 MG tablet, Take 1,000 mg by mouth every 6 (six) hours as needed for headache. .  meloxicam (MOBIC) 15 MG tablet, Take 1 tablet (15 mg total) by mouth daily. .  SUMAtriptan (IMITREX) 50 MG tablet, Take 1 tablet (50 mg total) by mouth once as needed for up to 1 dose for migraine. May repeat in 2 hours if headache persists or recurs.   Current Outpatient Medications (Other):  Marland Kitchen  buPROPion (WELLBUTRIN XL) 300 MG 24 hr tablet, Take 1 tablet (300 mg total) by mouth daily. .  diclofenac Sodium (VOLTAREN) 1 % GEL, Apply 2 g  topically 4 (four) times daily. .  Multiple Vitamins-Minerals (MULTIVITAMIN WITH MINERALS) tablet, Take 1 tablet by mouth daily. .  ondansetron (ZOFRAN ODT) 4 MG disintegrating tablet, Take 1 tablet (4 mg total) by mouth every 8 (eight) hours as needed for nausea or vomiting. Marland Kitchen  tiZANidine (ZANAFLEX) 4 MG tablet, Take 1 tablet (4 mg total) by mouth at bedtime. .  Vitamin D, Ergocalciferol, (DRISDOL) 1.25 MG (50000 UNIT) CAPS capsule, Take 1 capsule (50,000 Units total) by mouth every 7 (seven) days for 12 doses.   Reviewed prior external information including notes and imaging from  primary care provider As well as notes that were available from care everywhere and other healthcare systems.  Past medical history, social, surgical and family history all reviewed in electronic medical record.  No pertanent information unless stated regarding to the chief complaint.   Review of Systems:  No headache, visual changes, nausea, vomiting, diarrhea, constipation, dizziness, abdominal pain, skin rash, fevers, chills, night sweats, weight loss, swollen lymph nodes, body aches, joint swelling, chest pain, shortness of breath, mood changes. POSITIVE muscle aches  Objective  There were no vitals taken for this visit.   General: No apparent distress alert and oriented x3 mood and affect normal, dressed appropriately.  Overweight HEENT: Pupils equal, extraocular movements intact    Respiratory: Patient's speak in full sentences and does not appear short of breath  Gait antalgic Left foot shows that patient does have a plantar wart on the plantar aspect of the foot.  Patient is tender to palpation of this area.  Not as much tenderness over the midfoot that patient was having previously.  No swelling noted at this point.  Knee: valgus deformity noted. Large thigh to calf ratio.  Tender to palpation over medial and PF joint line.  ROM full in flexion and extension and lower leg rotation. instability with valgus force.  painful patellar compression. Patellar glide with moderate crepitus. Patellar and quadriceps tendons unremarkable. Hamstring and quadriceps strength is normal. Contralateral knee shows  After informed written and verbal consent, patient was seated on exam table. Right knee was prepped with alcohol swab and utilizing anterolateral approach, patient's right knee space was injected with 4:1  marcaine 0.5%: Kenalog 40mg /dL. Patient tolerated the procedure well without immediate complications.  After informed written and verbal consent, patient was seated on exam table. Left knee was prepped with alcohol swab and utilizing anterolateral approach, patient's left knee space was injected with 4:1  marcaine 0.5%: Kenalog 40mg /dL. Patient tolerated the procedure well without immediate complications.    Impression and Recommendations:     The above documentation has been reviewed and is accurate and complete Jacqualin Combes       Note: This dictation was prepared with Dragon dictation along with smaller phrase technology. Any transcriptional errors that result from this process are unintentional.

## 2019-09-19 NOTE — Patient Instructions (Signed)
Wart remover cream daily for the next week See me again in 8 weeks

## 2019-09-19 NOTE — Assessment & Plan Note (Signed)
Bilateral injections given again today.  Encourage weight loss, can repeat every 8 to 10 weeks.  Patient is still working on the weight and if she can lose more we will consider the possibility of replacement.  Follow-up otherwise 8 to 10 weeks

## 2019-09-19 NOTE — Assessment & Plan Note (Signed)
Left foot pain seems to be more of a dull, throbbing aching pain.  Patient seems to be doing better but has had no more of a plantar wart likely now from something else.  Patient will try wart remover cream otherwise we will do manual cut down if necessary at follow-up

## 2019-10-06 ENCOUNTER — Ambulatory Visit: Payer: BC Managed Care – PPO | Admitting: Internal Medicine

## 2019-10-26 DIAGNOSIS — Z20822 Contact with and (suspected) exposure to covid-19: Secondary | ICD-10-CM | POA: Diagnosis not present

## 2019-10-26 DIAGNOSIS — Z03818 Encounter for observation for suspected exposure to other biological agents ruled out: Secondary | ICD-10-CM | POA: Diagnosis not present

## 2019-11-08 ENCOUNTER — Encounter: Payer: Self-pay | Admitting: Family Medicine

## 2019-11-10 ENCOUNTER — Encounter: Payer: Self-pay | Admitting: Family Medicine

## 2019-11-10 ENCOUNTER — Ambulatory Visit: Payer: BC Managed Care – PPO | Admitting: Family Medicine

## 2019-11-10 ENCOUNTER — Other Ambulatory Visit: Payer: Self-pay

## 2019-11-10 ENCOUNTER — Ambulatory Visit: Payer: Self-pay

## 2019-11-10 VITALS — BP 130/84 | Ht 60.0 in | Wt 260.0 lb

## 2019-11-10 DIAGNOSIS — M170 Bilateral primary osteoarthritis of knee: Secondary | ICD-10-CM

## 2019-11-10 DIAGNOSIS — G8929 Other chronic pain: Secondary | ICD-10-CM | POA: Diagnosis not present

## 2019-11-10 DIAGNOSIS — M25561 Pain in right knee: Secondary | ICD-10-CM

## 2019-11-10 MED ORDER — VITAMIN D (ERGOCALCIFEROL) 1.25 MG (50000 UNIT) PO CAPS: 50000.0000 [IU] | ORAL_CAPSULE | ORAL | 0 refills | Status: AC

## 2019-11-10 NOTE — Patient Instructions (Signed)
Once weekly Vit D Arnica lotion pennsaid Try not to fall See me in 3 weeks for bilateral injections

## 2019-11-10 NOTE — Assessment & Plan Note (Addendum)
Patient has bilateral knee pain.  Right recently worse than the left.  Did have a fall and on ultrasound seems to have a contusion.  Does have some mild irregularity of the anterior medial aspect of the knee that is difficult to assess secondary to patient's body habitus on ultrasound.  Unable to get x-ray today because we do not have it open and patient does not want to drive to another facility.  We discussed that still we could do further work-up such as an MRI but I do not think it would change management secondary to patient being able to ambulate just fine and patient does need a knee replacement secondary to the severity of the arthritis.  Patient still is frustrated and has been trying to lose weight but is not to the point where replacement is possible.  Follow-up in 3 weeks for the injections.

## 2019-11-10 NOTE — Progress Notes (Signed)
[facility] [place] Phone: [phone] Subjective:   [name], am serving as a scribe for Dr. Hulan Saas. This visit occurred during the SARS-CoV-2 public health emergency.  Safety protocols were in place, including screening questions prior to the visit, additional usage of staff PPE, and extensive cleaning of exam room while observing appropriate contact time as indicated for disinfecting solutions.  I'm seeing this patient by the request  of:  Isaac Bliss, Rayford Halsted, MD  CC: bilateral knee pain   TDV:[id number]   09/19/2019 Bilateral injections given again today.  Encourage weight loss, can repeat every 8 to 10 weeks.  Patient is still working on the weight and if she can lose more we will consider the possibility of replacement.  Follow-up otherwise 8 to 10 weeks  Left foot pain seems to be more of a dull, throbbing aching pain.  Patient seems to be doing better but has had no more of a plantar wart likely now from something else.  Patient will try wart remover cream otherwise we will do manual cut down if necessary at follow-up  Update 11/10/2019 Nicola Heinemann is a 49 y.o. female coming in with complaint of bilateral knee pain. Patient would like an US of the right knee to see if she injured any structures from a previous fall. Constant pain. Fell on knee twice since we last saw her.      Past Medical History:  Diagnosis Date  . Allergy   . Arthritis    knees  . Depression   . Diabetes mellitus without complication ([place])   . Hypertension   . Migraines   . Ulcer    Past Surgical History:  Procedure Laterality Date  . CESAREAN SECTION  1993  . COLONOSCOPY WITH PROPOFOL N/A 08/08/2019   Procedure: COLONOSCOPY WITH PROPOFOL;  Surgeon: Thornton Park, MD;  Location: [facility];  Service: Gastroenterology;  Laterality: N/A;  . HYSTEROSCOPY WITH NOVASURE N/A 06/02/2016   Procedure: HYSTEROSCOPY WITH  NOVASURE;  Surgeon: Paula Compton, MD;  Location: [facility];  Service: Gynecology;  Laterality: N/A;  . TUBAL LIGATION  1993   Social History   Socioeconomic History  . Marital status: Married    Spouse name: Not on file  . Number of children: Not on file  . Years of education: 57  . Highest education level: Not on file  Occupational History  . Occupation: Product manager: [employer]  Tobacco Use  . Smoking status: Never Smoker  . Smokeless tobacco: Never Used  Vaping Use  . Vaping Use: Never used  Substance and Sexual Activity  . Alcohol use: Not Currently    Comment: occ  . Drug use: No  . Sexual activity: Yes    Birth control/protection: Surgical  Other Topics Concern  . Not on file  Social History Narrative   Regular exercise-yes   Caffeine Use-no   Social Determinants of Health   Financial Resource Strain:   . Difficulty of Paying Living Expenses: Not on file  Food Insecurity:   . Worried About Charity fundraiser in the Last Year: Not on file  . Ran Out of Food in the Last Year: Not on file  Transportation Needs:   . Lack of Transportation (Medical): Not on file  . Lack of Transportation (Non-Medical): Not on file  Physical Activity:   . Days of Exercise per Week: Not on file  . Minutes of Exercise  per Session: Not on file  Stress:   . Feeling of Stress : Not on file  Social Connections:   . Frequency of Communication with Friends and Family: Not on file  . Frequency of Social Gatherings with Friends and Family: Not on file  . Attends Religious Services: Not on file  . Active Member of Clubs or Organizations: Not on file  . Attends Archivist Meetings: Not on file  . Marital Status: Not on file   Allergies  Allergen Reactions  . Iodine Anaphylaxis  . Other Anaphylaxis    Hair dye  . Shellfish Allergy Anaphylaxis    Closes throat  . Codeine Itching  . Menthol Hives  . Morphine And Related Itching  . Penicillins Swelling     Has patient had a PCN reaction causing immediate rash, facial/tongue/throat swelling, SOB or lightheadedness with hypotension: Yes Has patient had a PCN reaction causing severe rash involving mucus membranes or skin necrosis: No Has patient had a PCN reaction that required hospitalization No Has patient had a PCN reaction occurring within the last 10 years: No If all of the above answers are "NO", then may proceed with Cephalosporin use.  Ebbie Ridge [Pseudoephedrine Hcl] Swelling  . Azithromycin Rash  . Erythromycin Rash   Family History  Problem Relation Age of Onset  . Diabetes Mother   . Hypertension Mother   . Heart disease Mother   . Hyperlipidemia Mother   . Diabetes Father   . Hypertension Father   . Heart disease Father   . Hyperlipidemia Father   . Crohn's disease Father   . Cervical cancer Sister   . Heart disease Sister   . Diabetes Sister   . Heart disease Sister   . Diabetes Sister   . Colon polyps Neg Hx   . Esophageal cancer Neg Hx   . Stomach cancer Neg Hx   . Rectal cancer Neg Hx     Current Outpatient Medications (Endocrine & Metabolic):  .  metFORMIN (GLUCOPHAGE) 1000 MG tablet, Take 1 tablet (1,000 mg total) by mouth 2 (two) times daily with a meal.  Current Outpatient Medications (Cardiovascular):  .  atorvastatin (LIPITOR) 40 MG tablet, TAKE 1 TABLET BY MOUTH ONCE DAILY AT  6  PM (Patient taking differently: Take 40 mg by mouth daily. TAKE 1 TABLET BY MOUTH ONCE DAILY AT  6  PM) .  EPINEPHrine (EPIPEN JR) 0.15 MG/0.3ML injection, Inject 0.6 mLs (0.3 mg total) into the muscle once as needed for up to 1 dose for anaphylaxis. .  hydrochlorothiazide (HYDRODIURIL) 25 MG tablet, Take 1 tablet (25 mg total) by mouth daily. Marland Kitchen  losartan (COZAAR) 50 MG tablet, Take 1 tablet (50 mg total) by mouth daily.  Current Outpatient Medications (Respiratory):  .  montelukast (SINGULAIR) 10 MG tablet, Take 1 tablet (10 mg total) by mouth at bedtime.  Current Outpatient  Medications (Analgesics):  .  acetaminophen (TYLENOL) 500 MG tablet, Take 1,000 mg by mouth every 6 (six) hours as needed for headache. .  meloxicam (MOBIC) 15 MG tablet, Take 1 tablet (15 mg total) by mouth daily. .  SUMAtriptan (IMITREX) 50 MG tablet, Take 1 tablet (50 mg total) by mouth once as needed for up to 1 dose for migraine. May repeat in 2 hours if headache persists or recurs.   Current Outpatient Medications (Other):  Marland Kitchen  buPROPion (WELLBUTRIN XL) 300 MG 24 hr tablet, Take 1 tablet (300 mg total) by mouth daily. .  diclofenac Sodium (VOLTAREN)  1 % GEL, Apply 2 g topically 4 (four) times daily. .  Multiple Vitamins-Minerals (MULTIVITAMIN WITH MINERALS) tablet, Take 1 tablet by mouth daily. .  ondansetron (ZOFRAN ODT) 4 MG disintegrating tablet, Take 1 tablet (4 mg total) by mouth every 8 (eight) hours as needed for nausea or vomiting. Marland Kitchen  tiZANidine (ZANAFLEX) 4 MG tablet, Take 1 tablet (4 mg total) by mouth at bedtime. .  Vitamin D, Ergocalciferol, (DRISDOL) 1.25 MG (50000 UNIT) CAPS capsule, Take 1 capsule (50,000 Units total) by mouth every 7 (seven) days for 12 doses. .  Vitamin D, Ergocalciferol, (DRISDOL) 1.25 MG (50000 UNIT) CAPS capsule, Take 1 capsule (50,000 Units total) by mouth every 7 (seven) days.   Reviewed prior external information including notes and imaging from  primary care provider As well as notes that were available from care everywhere and other healthcare systems.  Past medical history, social, surgical and family history all reviewed in electronic medical record.  No pertanent information unless stated regarding to the chief complaint.   Review of Systems:  No headache, visual changes, nausea, vomiting, diarrhea, constipation, dizziness, abdominal pain, skin rash, fevers, chills, night sweats, weight loss, swollen lymph nodes,  chest pain, shortness of breath, mood changes. POSITIVE muscle aches  Objective  Blood pressure 130/84, height 5' (1.524 m),  weight 260 lb (117.9 kg).   General: No apparent distress alert and oriented x3 mood and affect normal, dressed appropriately.  HEENT: Pupils equal, extraocular movements intact  Respiratory: Patient's speak in full sentences and does not appear short of breath  Cardiovascular: No lower extremity edema, non tender, no erythema  Gait antalgic.  MSK:   Knee: Bilateral valgus deformity noted. Large thigh to calf ratio.  Tender to palpation over medial and PF joint line.  Significantly more tender on the right medial aspect of the knee than usual.  Just inferior and medial to the tibial tuberosity ROM decreased bilaterally with right lacking an extra 5 degrees of flexion. instability with valgus force.  painful patellar compression. Patellar glide with moderate crepitus. Patellar and quadriceps tendons unremarkable. Hamstring and quadriceps strength is normal.   Limited musculoskeletal ultrasound was performed and interpreted by Lyndal Pulley  Limited ultrasound shows the patient does have a mild cortical irregularity that looks more like a bone contusion of the tibia.  Patient also of the patella itself has what appears to be a contusion.  Patella tendon is intact.  Significant arthritic changes of the medial joint space    Impression and Recommendations:     The above documentation has been reviewed and is accurate and complete Lyndal Pulley, DO

## 2019-12-01 ENCOUNTER — Ambulatory Visit: Payer: BC Managed Care – PPO | Admitting: Family Medicine

## 2019-12-01 NOTE — Progress Notes (Deleted)
[facility] [place] [facility] Phone: [phone] Subjective:    I'm seeing this patient by the request  of:  Erline Hau, MD  CC: Bilateral knee pain  ACZ:[id number]   11/10/2019 Patient has bilateral knee pain.  Right recently worse than the left.  Did have a fall and on ultrasound seems to have a contusion.  Does have some mild irregularity of the anterior medial aspect of the knee that is difficult to assess secondary to patient's body habitus on ultrasound.  Unable to get x-ray today because we do not have it open and patient does not want to drive to another facility.  We discussed that still we could do further work-up such as an MRI but I do not think it would change management secondary to patient being able to ambulate just fine and patient does need a knee replacement secondary to the severity of the arthritis.  Patient still is frustrated and has been trying to lose weight but is not to the point where replacement is possible.  Follow-up in 3 weeks for the injections.   Update 12/01/2019 Lynn Fields is a 49 y.o. female coming in with complaint of bilateral knee pain. Patient states       Past Medical History:  Diagnosis Date  . Allergy   . Arthritis    knees  . Depression   . Diabetes mellitus without complication ([place])   . Hypertension   . Migraines   . Ulcer    Past Surgical History:  Procedure Laterality Date  . CESAREAN SECTION  1993  . COLONOSCOPY WITH PROPOFOL N/A 08/08/2019   Procedure: COLONOSCOPY WITH PROPOFOL;  Surgeon: Thornton Park, MD;  Location: [facility];  Service: Gastroenterology;  Laterality: N/A;  . HYSTEROSCOPY WITH NOVASURE N/A 06/02/2016   Procedure: HYSTEROSCOPY WITH NOVASURE;  Surgeon: Paula Compton, MD;  Location: [facility];  Service: Gynecology;  Laterality: N/A;  . TUBAL LIGATION  1993   Social History   Socioeconomic History  . Marital status: Married    Spouse  name: Not on file  . Number of children: Not on file  . Years of education: 56  . Highest education level: Not on file  Occupational History  . Occupation: Product manager: [employer]  Tobacco Use  . Smoking status: Never Smoker  . Smokeless tobacco: Never Used  Vaping Use  . Vaping Use: Never used  Substance and Sexual Activity  . Alcohol use: Not Currently    Comment: occ  . Drug use: No  . Sexual activity: Yes    Birth control/protection: Surgical  Other Topics Concern  . Not on file  Social History Narrative   Regular exercise-yes   Caffeine Use-no   Social Determinants of Health   Financial Resource Strain:   . Difficulty of Paying Living Expenses: Not on file  Food Insecurity:   . Worried About Charity fundraiser in the Last Year: Not on file  . Ran Out of Food in the Last Year: Not on file  Transportation Needs:   . Lack of Transportation (Medical): Not on file  . Lack of Transportation (Non-Medical): Not on file  Physical Activity:   . Days of Exercise per Week: Not on file  . Minutes of Exercise per Session: Not on file  Stress:   . Feeling of Stress : Not on file  Social Connections:   . Frequency of Communication with Friends and Family:  Not on file  . Frequency of Social Gatherings with Friends and Family: Not on file  . Attends Religious Services: Not on file  . Active Member of Clubs or Organizations: Not on file  . Attends Archivist Meetings: Not on file  . Marital Status: Not on file   Allergies  Allergen Reactions  . Iodine Anaphylaxis  . Other Anaphylaxis    Hair dye  . Shellfish Allergy Anaphylaxis    Closes throat  . Codeine Itching  . Menthol Hives  . Morphine And Related Itching  . Penicillins Swelling    Has patient had a PCN reaction causing immediate rash, facial/tongue/throat swelling, SOB or lightheadedness with hypotension: Yes Has patient had a PCN reaction causing severe rash involving mucus  membranes or skin necrosis: No Has patient had a PCN reaction that required hospitalization No Has patient had a PCN reaction occurring within the last 10 years: No If all of the above answers are "NO", then may proceed with Cephalosporin use.  Ebbie Ridge [Pseudoephedrine Hcl] Swelling  . Azithromycin Rash  . Erythromycin Rash   Family History  Problem Relation Age of Onset  . Diabetes Mother   . Hypertension Mother   . Heart disease Mother   . Hyperlipidemia Mother   . Diabetes Father   . Hypertension Father   . Heart disease Father   . Hyperlipidemia Father   . Crohn's disease Father   . Cervical cancer Sister   . Heart disease Sister   . Diabetes Sister   . Heart disease Sister   . Diabetes Sister   . Colon polyps Neg Hx   . Esophageal cancer Neg Hx   . Stomach cancer Neg Hx   . Rectal cancer Neg Hx     Current Outpatient Medications (Endocrine & Metabolic):  .  metFORMIN (GLUCOPHAGE) 1000 MG tablet, Take 1 tablet (1,000 mg total) by mouth 2 (two) times daily with a meal.  Current Outpatient Medications (Cardiovascular):  .  atorvastatin (LIPITOR) 40 MG tablet, TAKE 1 TABLET BY MOUTH ONCE DAILY AT  6  PM (Patient taking differently: Take 40 mg by mouth daily. TAKE 1 TABLET BY MOUTH ONCE DAILY AT  6  PM) .  EPINEPHrine (EPIPEN JR) 0.15 MG/0.3ML injection, Inject 0.6 mLs (0.3 mg total) into the muscle once as needed for up to 1 dose for anaphylaxis. .  hydrochlorothiazide (HYDRODIURIL) 25 MG tablet, Take 1 tablet (25 mg total) by mouth daily. Marland Kitchen  losartan (COZAAR) 50 MG tablet, Take 1 tablet (50 mg total) by mouth daily.  Current Outpatient Medications (Respiratory):  .  montelukast (SINGULAIR) 10 MG tablet, Take 1 tablet (10 mg total) by mouth at bedtime.  Current Outpatient Medications (Analgesics):  .  acetaminophen (TYLENOL) 500 MG tablet, Take 1,000 mg by mouth every 6 (six) hours as needed for headache. .  meloxicam (MOBIC) 15 MG tablet, Take 1 tablet (15 mg total) by  mouth daily. .  SUMAtriptan (IMITREX) 50 MG tablet, Take 1 tablet (50 mg total) by mouth once as needed for up to 1 dose for migraine. May repeat in 2 hours if headache persists or recurs.   Current Outpatient Medications (Other):  Marland Kitchen  buPROPion (WELLBUTRIN XL) 300 MG 24 hr tablet, Take 1 tablet (300 mg total) by mouth daily. .  diclofenac Sodium (VOLTAREN) 1 % GEL, Apply 2 g topically 4 (four) times daily. .  Multiple Vitamins-Minerals (MULTIVITAMIN WITH MINERALS) tablet, Take 1 tablet by mouth daily. .  ondansetron (ZOFRAN ODT)  4 MG disintegrating tablet, Take 1 tablet (4 mg total) by mouth every 8 (eight) hours as needed for nausea or vomiting. Marland Kitchen  tiZANidine (ZANAFLEX) 4 MG tablet, Take 1 tablet (4 mg total) by mouth at bedtime. .  Vitamin D, Ergocalciferol, (DRISDOL) 1.25 MG (50000 UNIT) CAPS capsule, Take 1 capsule (50,000 Units total) by mouth every 7 (seven) days.   Reviewed prior external information including notes and imaging from  primary care provider As well as notes that were available from care everywhere and other healthcare systems.  Past medical history, social, surgical and family history all reviewed in electronic medical record.  No pertanent information unless stated regarding to the chief complaint.   Review of Systems:  No headache, visual changes, nausea, vomiting, diarrhea, constipation, dizziness, abdominal pain, skin rash, fevers, chills, night sweats, weight loss, swollen lymph nodes, body aches, joint swelling, chest pain, shortness of breath, mood changes. POSITIVE muscle aches  Objective  There were no vitals taken for this visit.   General: No apparent distress alert and oriented x3 mood and affect normal, dressed appropriately.  HEENT: Pupils equal, extraocular movements intact  Respiratory: Patient's speak in full sentences and does not appear short of breath  Cardiovascular: No lower extremity edema, non tender, no erythema  Knee: Bilateral valgus  deformity noted. Large thigh to calf ratio.  Tender to palpation over medial and PF joint line.  ROM full in flexion and extension and lower leg rotation. instability with valgus force.  painful patellar compression. Patellar glide with moderate crepitus. Patellar and quadriceps tendons unremarkable. Hamstring and quadriceps strength is normal.     Impression and Recommendations:     The above documentation has been reviewed and is accurate and complete Lyndal Pulley, DO

## 2019-12-12 ENCOUNTER — Encounter: Payer: Self-pay | Admitting: Family Medicine

## 2019-12-19 NOTE — Progress Notes (Signed)
[place] Phone: [phone] Subjective:   Lynn [name], am serving as a scribe for Dr. Hulan Saas. This visit occurred during the SARS-CoV-2 public health emergency.  Safety protocols were in place, including screening questions prior to the visit, additional usage of staff PPE, and extensive cleaning of exam room while observing appropriate contact time as indicated for disinfecting solutions.   I'm seeing this patient by the request  of:  Isaac Bliss, Rayford Halsted, MD  CC: Bilateral knee pain follow-up  [id number]   11/10/2019 Patient has bilateral knee pain.  Right recently worse than the left.  Did have a fall and on ultrasound seems to have a contusion.  Does have some mild irregularity of the anterior medial aspect of the knee that is difficult to assess secondary to patient's body habitus on ultrasound.  Unable to get x-ray today because we do not have it open and patient does not want to drive to another facility.  We discussed that still we could do further work-up such as an MRI but I do not think it would change management secondary to patient being able to ambulate just fine and patient does need a knee replacement secondary to the severity of the arthritis.  Patient still is frustrated and has been trying to lose weight but is not to the point where replacement is possible.  Follow-up in 3 weeks for the injections.  Update 12/19/2019 Lynn [name] is a 49 y.o. female coming in with complaint of bilateral knee injections. States that she has been active and moved to a new house. R>L.  Patient has severe bone osteoarthritic changes.  Patient is still attempting to lose weight at this time.  Patient is trying to be active.  Pain [name] pain be unrelenting.  It does affect daily activities.      Past Medical History:  Diagnosis Date  . Allergy   . Arthritis    knees  . Depression   . Diabetes mellitus  without complication ([place])   . Hypertension   . Migraines   . Ulcer    Past Surgical History:  Procedure Laterality Date  . CESAREAN SECTION  1993  . COLONOSCOPY WITH PROPOFOL N/A 08/08/2019   Procedure: COLONOSCOPY WITH PROPOFOL;  Surgeon: Thornton Park, MD;  Location: [facility];  Service: Gastroenterology;  Laterality: N/A;  . HYSTEROSCOPY WITH NOVASURE N/A 06/02/2016   Procedure: HYSTEROSCOPY WITH NOVASURE;  Surgeon: Paula Compton, MD;  Location: [facility];  Service: Gynecology;  Laterality: N/A;  . TUBAL LIGATION  1993   Social History   Socioeconomic History  . Marital status: Married    Spouse name: Not on file  . Number of children: Not on file  . Years of education: 25  . Highest education level: Not on file  Occupational History  . Occupation: Product manager: [employer]  Tobacco Use  . Smoking status: Never Smoker  . Smokeless tobacco: Never Used  Vaping Use  . Vaping Use: Never used  Substance and Sexual Activity  . Alcohol use: Not Currently    Comment: occ  . Drug use: [name]  . Sexual activity: Yes    Birth control/protection: Surgical  Other Topics Concern  . Not on file  Social History Narrative   Regular exercise-yes   Caffeine Use-[name]   Social Determinants of Health   Financial Resource Strain:   . Difficulty of Paying Living Expenses: Not on file  Food Insecurity:   . Worried About Charity fundraiser in the Last Year: Not on file  . Ran Out of Food in the Last Year: Not on file  Transportation Needs:   . Lack of Transportation (Medical): Not on file  . Lack of Transportation (Non-Medical): Not on file  Physical Activity:   . Days of Exercise per Week: Not on file  . Minutes of Exercise per Session: Not on file  Stress:   . Feeling of Stress : Not on file  Social Connections:   . Frequency of Communication with Friends and Family: Not on file  . Frequency of Social Gatherings with Friends and Family: Not on file  .  Attends Religious Services: Not on file  . Active Member of Clubs or Organizations: Not on file  . Attends Archivist Meetings: Not on file  . Marital Status: Not on file   Allergies  Allergen Reactions  . Iodine Anaphylaxis  . Other Anaphylaxis    Hair dye  . Shellfish Allergy Anaphylaxis    Closes throat  . Codeine Itching  . Menthol Hives  . Morphine And Related Itching  . Penicillins Swelling    Has patient had a PCN reaction causing immediate rash, facial/tongue/throat swelling, SOB or lightheadedness with hypotension: Yes Has patient had a PCN reaction causing severe rash involving mucus membranes or skin necrosis: [name] Has patient had a PCN reaction that required hospitalization [name] Has patient had a PCN reaction occurring within the last 10 years: [name] If all of the above answers are "[name]", then may proceed with Cephalosporin use.  [place] [Pseudoephedrine Hcl] Swelling  . Azithromycin Rash  . Erythromycin Rash   Family History  Problem Relation Age of Onset  . Diabetes Mother   . Hypertension Mother   . Heart disease Mother   . Hyperlipidemia Mother   . Diabetes Father   . Hypertension Father   . Heart disease Father   . Hyperlipidemia Father   . Crohn's disease Father   . Cervical cancer Sister   . Heart disease Sister   . Diabetes Sister   . Heart disease Sister   . Diabetes Sister   . Colon polyps Neg Hx   . Esophageal cancer Neg Hx   . Stomach cancer Neg Hx   . Rectal cancer Neg Hx     Current Outpatient Medications (Endocrine & Metabolic):  .  metFORMIN (GLUCOPHAGE) 1000 MG tablet, Take 1 tablet (1,000 mg total) by mouth 2 (two) times daily with a meal.  Current Outpatient Medications (Cardiovascular):  .  atorvastatin (LIPITOR) 40 MG tablet, TAKE 1 TABLET BY MOUTH ONCE DAILY AT  6  PM (Patient taking differently: Take 40 mg by mouth daily. TAKE 1 TABLET BY MOUTH ONCE DAILY AT  6  PM) .  EPINEPHrine (EPIPEN JR) 0.15 MG/0.3ML injection, Inject  0.6 mLs (0.3 mg total) into the muscle once as needed for up to 1 dose for anaphylaxis. .  hydrochlorothiazide (HYDRODIURIL) 25 MG tablet, Take 1 tablet (25 mg total) by mouth daily. Marland Kitchen  losartan (COZAAR) 50 MG tablet, Take 1 tablet (50 mg total) by mouth daily.  Current Outpatient Medications (Respiratory):  .  montelukast (SINGULAIR) 10 MG tablet, Take 1 tablet (10 mg total) by mouth at bedtime.  Current Outpatient Medications (Analgesics):  .  acetaminophen (TYLENOL) 500 MG tablet, Take 1,000 mg by mouth every 6 (six) hours as needed for headache. .  meloxicam (MOBIC) 15 MG tablet, Take 1 tablet (  15 mg total) by mouth daily. .  SUMAtriptan (IMITREX) 50 MG tablet, Take 1 tablet (50 mg total) by mouth once as needed for up to 1 dose for migraine. May repeat in 2 hours if headache persists or recurs.   Current Outpatient Medications (Other):  Marland Kitchen  buPROPion (WELLBUTRIN XL) 300 MG 24 hr tablet, Take 1 tablet (300 mg total) by mouth daily. .  diclofenac Sodium (VOLTAREN) 1 % GEL, Apply 2 g topically 4 (four) times daily. .  Multiple Vitamins-Minerals (MULTIVITAMIN WITH MINERALS) tablet, Take 1 tablet by mouth daily. .  ondansetron (ZOFRAN ODT) 4 MG disintegrating tablet, Take 1 tablet (4 mg total) by mouth every 8 (eight) hours as needed for nausea or vomiting. Marland Kitchen  tiZANidine (ZANAFLEX) 4 MG tablet, Take 1 tablet (4 mg total) by mouth at bedtime. .  Vitamin D, Ergocalciferol, (DRISDOL) 1.25 MG (50000 UNIT) CAPS capsule, Take 1 capsule (50,000 Units total) by mouth every 7 (seven) days.   Reviewed prior external information including notes and imaging from  primary care provider As well as notes that were available from care everywhere and other healthcare systems.  Past medical history, social, surgical and family history all reviewed in electronic medical record.  [name] pertanent information unless stated regarding to the chief complaint.   Review of Systems:  [name] headache, visual changes,  nausea, vomiting, diarrhea, constipation, dizziness, abdominal pain, skin rash, fevers, chills, night sweats, weight loss, swollen lymph nodes, body aches, joint swelling, chest pain, shortness of breath, mood changes. POSITIVE muscle aches  Objective  Blood pressure (!) 122/92, pulse 85, height 5' (1.524 m), weight 260 lb (117.9 kg), SpO2 99 %.   General: [name] apparent distress alert and oriented x3 mood and affect normal, dressed appropriately.  Overweight HEENT: Pupils equal, extraocular movements intact  Respiratory: Patient's speak in full sentences and does not appear short of breath  Cardiovascular: [name] lower extremity edema, non tender, [name] erythema  Antalgic gait Knee: Bilateral valgus deformity noted. Large thigh to calf ratio.  Tender to palpation over medial and PF joint line.  ROM full in flexion and extension and lower leg rotation. instability with valgus force.  painful patellar compression. Patellar glide with moderate crepitus. Patellar and quadriceps tendons unremarkable. Hamstring and quadriceps strength is normal.  After informed written and verbal consent, patient was seated on exam table. Right knee was prepped with alcohol swab and utilizing anterolateral approach, patient's right knee space was injected with 4:1  marcaine 0.5%: Kenalog 40mg /dL. Patient tolerated the procedure well without immediate complications.  After informed written and verbal consent, patient was seated on exam table. Left knee was prepped with alcohol swab and utilizing anterolateral approach, patient's left knee space was injected with 4:1  marcaine 0.5%: Kenalog 40mg /dL. Patient tolerated the procedure well without immediate complications.    Impression and Recommendations:     The above documentation has been reviewed and is accurate and complete Lyndal Pulley, DO

## 2019-12-20 ENCOUNTER — Ambulatory Visit: Payer: BC Managed Care – PPO | Admitting: Family Medicine

## 2019-12-20 ENCOUNTER — Encounter: Payer: Self-pay | Admitting: Family Medicine

## 2019-12-20 ENCOUNTER — Other Ambulatory Visit: Payer: Self-pay

## 2019-12-20 DIAGNOSIS — M170 Bilateral primary osteoarthritis of knee: Secondary | ICD-10-CM

## 2019-12-20 NOTE — Patient Instructions (Addendum)
Injected knees today [employer] See me in 10 weeks

## 2019-12-20 NOTE — Assessment & Plan Note (Signed)
Repeat injection given today.  Tolerated the procedure well, discussed icing regimen and home exercise, discussed which activities to do which wants to avoid.  Increase activity slowly.  Discussed icing follow-up again in 10 weeks

## 2020-01-10 ENCOUNTER — Ambulatory Visit: Payer: BC Managed Care – PPO | Admitting: Family Medicine

## 2020-02-07 DIAGNOSIS — Z124 Encounter for screening for malignant neoplasm of cervix: Secondary | ICD-10-CM | POA: Diagnosis not present

## 2020-02-07 DIAGNOSIS — Z01419 Encounter for gynecological examination (general) (routine) without abnormal findings: Secondary | ICD-10-CM | POA: Diagnosis not present

## 2020-02-07 DIAGNOSIS — Z130 Encounter for screening for diseases of the blood and blood-forming organs and certain disorders involving the immune mechanism: Secondary | ICD-10-CM | POA: Diagnosis not present

## 2020-02-07 DIAGNOSIS — Z1231 Encounter for screening mammogram for malignant neoplasm of breast: Secondary | ICD-10-CM | POA: Diagnosis not present

## 2020-02-07 DIAGNOSIS — Z1151 Encounter for screening for human papillomavirus (HPV): Secondary | ICD-10-CM | POA: Diagnosis not present

## 2020-02-08 DIAGNOSIS — Z124 Encounter for screening for malignant neoplasm of cervix: Secondary | ICD-10-CM | POA: Diagnosis not present

## 2020-02-08 DIAGNOSIS — Z1151 Encounter for screening for human papillomavirus (HPV): Secondary | ICD-10-CM | POA: Diagnosis not present

## 2020-02-09 ENCOUNTER — Ambulatory Visit: Payer: BC Managed Care – PPO | Admitting: Internal Medicine

## 2020-02-20 DIAGNOSIS — R110 Nausea: Secondary | ICD-10-CM | POA: Diagnosis not present

## 2020-02-20 DIAGNOSIS — F321 Major depressive disorder, single episode, moderate: Secondary | ICD-10-CM | POA: Diagnosis not present

## 2020-02-20 DIAGNOSIS — R238 Other skin changes: Secondary | ICD-10-CM | POA: Diagnosis not present

## 2020-02-20 DIAGNOSIS — E785 Hyperlipidemia, unspecified: Secondary | ICD-10-CM | POA: Diagnosis not present

## 2020-02-20 DIAGNOSIS — E1159 Type 2 diabetes mellitus with other circulatory complications: Secondary | ICD-10-CM | POA: Diagnosis not present

## 2020-02-20 DIAGNOSIS — N951 Menopausal and female climacteric states: Secondary | ICD-10-CM | POA: Diagnosis not present

## 2020-02-20 DIAGNOSIS — E1169 Type 2 diabetes mellitus with other specified complication: Secondary | ICD-10-CM | POA: Diagnosis not present

## 2020-02-20 DIAGNOSIS — I152 Hypertension secondary to endocrine disorders: Secondary | ICD-10-CM | POA: Diagnosis not present

## 2020-02-27 NOTE — Progress Notes (Signed)
[place] Phone: [phone] Subjective:   Lynn Fields, am serving as a scribe for Dr. Hulan Saas. This visit occurred during the SARS-CoV-2 public health emergency.  Safety protocols were in place, including screening questions prior to the visit, additional usage of staff PPE, and extensive cleaning of exam room while observing appropriate contact time as indicated for disinfecting solutions.   I'm seeing this patient by the request  of:  Isaac Bliss, Rayford Halsted, MD  CC: bilateral knee pain   [id number]   12/20/2019 Repeat injection given today.  Tolerated the procedure well, discussed icing regimen and home exercise, discussed which activities to do which wants to avoid.  Increase activity slowly.  Discussed icing follow-up again in 10 weeks  Update 02/28/2020 Lynn Fields is a 50 y.o. female coming in with complaint of bilateral knee pain. Patient states that her pain has gotten worse recently. Has been icing for pain relief. Pain with sitting and standing.  Patient is trying to be active and continuing to try to lose weight.  BMI is still greater than 50.       Past Medical History:  Diagnosis Date  . Allergy   . Arthritis    knees  . Depression   . Diabetes mellitus without complication ([place])   . Hypertension   . Migraines   . Ulcer    Past Surgical History:  Procedure Laterality Date  . CESAREAN SECTION  1993  . COLONOSCOPY WITH PROPOFOL N/A 08/08/2019   Procedure: COLONOSCOPY WITH PROPOFOL;  Surgeon: Thornton Park, MD;  Location: [facility];  Service: Gastroenterology;  Laterality: N/A;  . HYSTEROSCOPY WITH NOVASURE N/A 06/02/2016   Procedure: HYSTEROSCOPY WITH NOVASURE;  Surgeon: Paula Compton, MD;  Location: [facility];  Service: Gynecology;  Laterality: N/A;  . TUBAL LIGATION  1993   Social History   Socioeconomic History  . Marital status: Married    Spouse name: Not on  file  . Number of children: Not on file  . Years of education: 51  . Highest education level: Not on file  Occupational History  . Occupation: Product manager: [employer]  Tobacco Use  . Smoking status: Never Smoker  . Smokeless tobacco: Never Used  Vaping Use  . Vaping Use: Never used  Substance and Sexual Activity  . Alcohol use: Not Currently    Comment: occ  . Drug use: Fields  . Sexual activity: Yes    Birth control/protection: Surgical  Other Topics Concern  . Not on file  Social History Narrative   Regular exercise-yes   Caffeine Use-Fields   Social Determinants of Health   Financial Resource Strain: Not on file  Food Insecurity: Not on file  Transportation Needs: Not on file  Physical Activity: Not on file  Stress: Not on file  Social Connections: Not on file   Allergies  Allergen Reactions  . Iodine Anaphylaxis  . Other Anaphylaxis    Hair dye  . Shellfish Allergy Anaphylaxis    Closes throat  . Codeine Itching  . Menthol Hives  . Morphine And Related Itching  . Penicillins Swelling    Has patient had a PCN reaction causing immediate rash, facial/tongue/throat swelling, SOB or lightheadedness with hypotension: Yes Has patient had a PCN reaction causing severe rash involving mucus membranes or skin necrosis: Fields Has patient had a PCN reaction that required hospitalization Fields Has patient had a PCN reaction occurring within  the last 10 years: Fields If all of the above answers are "Fields", then may proceed with Cephalosporin use.  Lynn Fields [Pseudoephedrine Hcl] Swelling  . Azithromycin Rash  . Erythromycin Rash   Family History  Problem Relation Age of Onset  . Diabetes Mother   . Hypertension Mother   . Heart disease Mother   . Hyperlipidemia Mother   . Diabetes Father   . Hypertension Father   . Heart disease Father   . Hyperlipidemia Father   . Crohn's disease Father   . Cervical cancer Sister   . Heart disease Sister   . Diabetes Sister    . Heart disease Sister   . Diabetes Sister   . Colon polyps Neg Hx   . Esophageal cancer Neg Hx   . Stomach cancer Neg Hx   . Rectal cancer Neg Hx     Current Outpatient Medications (Endocrine & Metabolic):  .  metFORMIN (GLUCOPHAGE) 1000 MG tablet, Take 1 tablet (1,000 mg total) by mouth 2 (two) times daily with a meal.  Current Outpatient Medications (Cardiovascular):  .  atorvastatin (LIPITOR) 40 MG tablet, TAKE 1 TABLET BY MOUTH ONCE DAILY AT  6  PM (Patient taking differently: Take 40 mg by mouth daily. TAKE 1 TABLET BY MOUTH ONCE DAILY AT  6  PM) .  EPINEPHrine (EPIPEN JR) 0.15 MG/0.3ML injection, Inject 0.6 mLs (0.3 mg total) into the muscle once as needed for up to 1 dose for anaphylaxis. .  hydrochlorothiazide (HYDRODIURIL) 25 MG tablet, Take 1 tablet (25 mg total) by mouth daily. Marland Kitchen  losartan (COZAAR) 50 MG tablet, Take 1 tablet (50 mg total) by mouth daily.  Current Outpatient Medications (Respiratory):  .  montelukast (SINGULAIR) 10 MG tablet, Take 1 tablet (10 mg total) by mouth at bedtime.  Current Outpatient Medications (Analgesics):  .  acetaminophen (TYLENOL) 500 MG tablet, Take 1,000 mg by mouth every 6 (six) hours as needed for headache. .  meloxicam (MOBIC) 15 MG tablet, Take 1 tablet (15 mg total) by mouth daily. .  SUMAtriptan (IMITREX) 50 MG tablet, Take 1 tablet (50 mg total) by mouth once as needed for up to 1 dose for migraine. May repeat in 2 hours if headache persists or recurs.   Current Outpatient Medications (Other):  .  diclofenac Sodium (VOLTAREN) 1 % GEL, Apply 2 g topically 4 (four) times daily. .  Multiple Vitamins-Minerals (MULTIVITAMIN WITH MINERALS) tablet, Take 1 tablet by mouth daily. .  ondansetron (ZOFRAN ODT) 4 MG disintegrating tablet, Take 1 tablet (4 mg total) by mouth every 8 (eight) hours as needed for nausea or vomiting. Marland Kitchen  tiZANidine (ZANAFLEX) 4 MG tablet, Take 1 tablet (4 mg total) by mouth at bedtime. .  Vitamin D, Ergocalciferol,  (DRISDOL) 1.25 MG (50000 UNIT) CAPS capsule, Take 1 capsule (50,000 Units total) by mouth every 7 (seven) days.   Reviewed prior external information including notes and imaging from  primary care provider As well as notes that were available from care everywhere and other healthcare systems.  Past medical history, social, surgical and family history all reviewed in electronic medical record.  Fields pertanent information unless stated regarding to the chief complaint.   Review of Systems:  Fields headache, visual changes, nausea, vomiting, diarrhea, constipation, dizziness, abdominal pain, skin rash, fevers, chills, night sweats, weight loss, swollen lymph nodes, body aches, joint swelling, chest pain, shortness of breath, mood changes. POSITIVE muscle aches  Objective  Blood pressure 124/86, pulse (!) 102, height 5' (1.524  m), weight 256 lb (116.1 kg), SpO2 97 %.   General: Fields apparent distress alert and oriented x3 mood and affect normal, dressed appropriately.  HEENT: Pupils equal, extraocular movements intact  Respiratory: Patient's speak in full sentences and does not appear short of breath  Cardiovascular: Fields lower extremity edema, non tender, Fields erythema  Gait mild antalgic MSK: Knee: valgus deformity noted. Large thigh to calf ratio.  Difficult to assess secondary to patient's body habitus Tender to palpation over medial and PF joint line.  ROM full in flexion and extension and lower leg rotation. instability with valgus force.  painful patellar compression. Patellar glide with moderate crepitus. Patellar and quadriceps tendons unremarkable. Hamstring and quadriceps strength is normal.  After informed written and verbal consent, patient was seated on exam table. Right knee was prepped with alcohol swab and utilizing anterolateral approach, patient's right knee space was injected with 4:1  marcaine 0.5%: Kenalog 40mg /dL. Patient tolerated the procedure well without immediate  complications.  After informed written and verbal consent, patient was seated on exam table. Left knee was prepped with alcohol swab and utilizing anterolateral approach, patient's left knee space was injected with 4:1  marcaine 0.5%: Kenalog 40mg /dL. Patient tolerated the procedure well without immediate complications.   Impression and Recommendations:     The above documentation has been reviewed and is accurate and complete Lyndal Pulley, DO

## 2020-02-28 ENCOUNTER — Encounter: Payer: Self-pay | Admitting: Family Medicine

## 2020-02-28 ENCOUNTER — Other Ambulatory Visit: Payer: Self-pay

## 2020-02-28 ENCOUNTER — Ambulatory Visit: Payer: BC Managed Care – PPO | Admitting: Family Medicine

## 2020-02-28 DIAGNOSIS — M170 Bilateral primary osteoarthritis of knee: Secondary | ICD-10-CM

## 2020-02-28 NOTE — Patient Instructions (Signed)
Injected both knees today Arnica lotion to add to your mix Keep doing everything else See me in 3 months

## 2020-02-28 NOTE — Assessment & Plan Note (Signed)
Bilateral injections given today.  Tolerated the procedure well, discussed icing regimen and home exercise, which activities to do which wants to avoid.  Increase activity slowly.  Encourage patient to continue to work on her weight loss.  Patient will follow up with me again in 3 months discussed that the patient can lose enough weight surgical intervention would likely be best for this individual.

## 2020-03-01 ENCOUNTER — Encounter: Payer: Self-pay | Admitting: Internal Medicine

## 2020-03-19 DIAGNOSIS — R110 Nausea: Secondary | ICD-10-CM | POA: Diagnosis not present

## 2020-03-19 DIAGNOSIS — I152 Hypertension secondary to endocrine disorders: Secondary | ICD-10-CM | POA: Diagnosis not present

## 2020-03-19 DIAGNOSIS — S6992XA Unspecified injury of left wrist, hand and finger(s), initial encounter: Secondary | ICD-10-CM | POA: Diagnosis not present

## 2020-03-19 DIAGNOSIS — F5101 Primary insomnia: Secondary | ICD-10-CM | POA: Diagnosis not present

## 2020-03-19 DIAGNOSIS — E1159 Type 2 diabetes mellitus with other circulatory complications: Secondary | ICD-10-CM | POA: Diagnosis not present

## 2020-03-19 DIAGNOSIS — E1169 Type 2 diabetes mellitus with other specified complication: Secondary | ICD-10-CM | POA: Diagnosis not present

## 2020-03-19 DIAGNOSIS — E669 Obesity, unspecified: Secondary | ICD-10-CM | POA: Diagnosis not present

## 2020-04-15 DIAGNOSIS — M79642 Pain in left hand: Secondary | ICD-10-CM | POA: Diagnosis not present

## 2020-04-15 DIAGNOSIS — S66912A Strain of unspecified muscle, fascia and tendon at wrist and hand level, left hand, initial encounter: Secondary | ICD-10-CM | POA: Diagnosis not present

## 2020-04-22 ENCOUNTER — Encounter: Payer: Self-pay | Admitting: Family Medicine
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
## 2020-04-27 ENCOUNTER — Other Ambulatory Visit: Payer: Self-pay

## 2020-04-27 ENCOUNTER — Ambulatory Visit: Payer: Self-pay

## 2020-04-27 ENCOUNTER — Encounter: Payer: Self-pay | Admitting: Family Medicine

## 2020-04-27 ENCOUNTER — Ambulatory Visit: Payer: BC Managed Care – PPO | Admitting: Family Medicine

## 2020-04-27 VITALS — BP 106/78 | HR 89 | Ht 60.0 in | Wt 256.0 lb

## 2020-04-27 DIAGNOSIS — M1711 Unilateral primary osteoarthritis, right knee: Secondary | ICD-10-CM | POA: Diagnosis not present

## 2020-04-27 DIAGNOSIS — S63642A Sprain of metacarpophalangeal joint of left thumb, initial encounter: Secondary | ICD-10-CM

## 2020-04-27 DIAGNOSIS — S63602A Unspecified sprain of left thumb, initial encounter: Secondary | ICD-10-CM | POA: Insufficient documentation

## 2020-04-27 DIAGNOSIS — M79642 Pain in left hand: Secondary | ICD-10-CM

## 2020-04-27 NOTE — Assessment & Plan Note (Signed)
Worse pain  Chronic, with exacerbation.  Patient is still attempting to lose weight.  Encouraged her to continue to work hard.  Patient did give an injection today and tolerated the procedure well.  Follow-up with me again as stated

## 2020-04-27 NOTE — Assessment & Plan Note (Signed)
Patient does have swelling noted.  Discussed with patient icing regimen and home exercises, discussed which activities to do at once to avoid.  Patient given a thumb spica splint follow-up again in 4 to 7 weeks

## 2020-04-27 NOTE — Patient Instructions (Signed)
Wrist brace day and night for 2 weeks, nightly 2 weeks Pennsaid  Ice See me again in May

## 2020-04-30 ENCOUNTER — Ambulatory Visit: Payer: BC Managed Care – PPO | Admitting: Family Medicine

## 2020-05-31 ENCOUNTER — Ambulatory Visit: Payer: BC Managed Care – PPO | Admitting: Family Medicine

## 2020-07-04 NOTE — Progress Notes (Signed)
[place] [address] [place] Phone: [phone] Subjective:   I Lynn Fields am serving as a Education administrator for Dr. Hulan Saas.  This visit occurred during the SARS-CoV-2 public health emergency.  Safety protocols were in place, including screening questions prior to the visit, additional usage of staff PPE, and extensive cleaning of exam room while observing appropriate contact time as indicated for disinfecting solutions.   I'm seeing this patient by the request  of:  Isaac Bliss, Rayford Halsted, MD  CC: knee pain   [id number]   04/27/2020 Patient does have swelling noted.  Discussed with patient icing regimen and home exercises, discussed which activities to do at once to avoid.  Patient given a thumb spica splint follow-up again in 4 to 7 weeks  Worse pain  Chronic, with exacerbation.  Patient is still attempting to lose weight.  Encouraged her to continue to work hard.  Patient did give an injection today and tolerated the procedure well.  Follow-up with me again as stated  Update 07/05/2020 Patient here for f/u R knee and L hand pain. Patient states her knees are painful. Would like bilateral injections. Left hand is still painful as well.  Patient has known severe arthritic changes of the knees.       Past Medical History:  Diagnosis Date   Allergy    Arthritis    knees   Depression    Diabetes mellitus without complication ([place])    Hypertension    Migraines    Ulcer    Past Surgical History:  Procedure Laterality Date   CESAREAN SECTION  1993   COLONOSCOPY WITH PROPOFOL N/A 08/08/2019   Procedure: COLONOSCOPY WITH PROPOFOL;  Surgeon: Thornton Park, MD;  Location: [facility];  Service: Gastroenterology;  Laterality: N/A;   HYSTEROSCOPY WITH NOVASURE N/A 06/02/2016   Procedure: HYSTEROSCOPY WITH NOVASURE;  Surgeon: Paula Compton, MD;  Location: [facility];  Service: Gynecology;  Laterality: N/A;   TUBAL LIGATION   1993   Social History   Socioeconomic History   Marital status: Married    Spouse name: Not on file   Number of children: Not on file   Years of education: 16   Highest education level: Not on file  Occupational History   Occupation: Product manager: [employer]  Tobacco Use   Smoking status: Never   Smokeless tobacco: Never  Vaping Use   Vaping Use: Never used  Substance and Sexual Activity   Alcohol use: Not Currently    Comment: occ   Drug use: No   Sexual activity: Yes    Birth control/protection: Surgical  Other Topics Concern   Not on file  Social History Narrative   Regular exercise-yes   Caffeine Use-no   Social Determinants of Health   Financial Resource Strain: Not on file  Food Insecurity: Not on file  Transportation Needs: Not on file  Physical Activity: Not on file  Stress: Not on file  Social Connections: Not on file   Allergies  Allergen Reactions   Iodine Anaphylaxis   Other Anaphylaxis    Hair dye   Shellfish Allergy Anaphylaxis    Closes throat   Codeine Itching   Menthol Hives   Morphine And Related Itching   Penicillins Swelling    Has patient had a PCN reaction causing immediate rash, facial/tongue/throat swelling, SOB or lightheadedness with hypotension: Yes Has patient had a PCN reaction causing severe rash involving mucus membranes  or skin necrosis: No Has patient had a PCN reaction that required hospitalization No Has patient had a PCN reaction occurring within the last 10 years: No If all of the above answers are "NO", then may proceed with Cephalosporin use.   Sudafed [Pseudoephedrine Hcl] Swelling   Azithromycin Rash   Erythromycin Rash   Family History  Problem Relation Age of Onset   Diabetes Mother    Hypertension Mother    Heart disease Mother    Hyperlipidemia Mother    Diabetes Father    Hypertension Father    Heart disease Father    Hyperlipidemia Father    Crohn's disease Father    Cervical  cancer Sister    Heart disease Sister    Diabetes Sister    Heart disease Sister    Diabetes Sister    Colon polyps Neg Hx    Esophageal cancer Neg Hx    Stomach cancer Neg Hx    Rectal cancer Neg Hx      Current Outpatient Medications (Cardiovascular):    atorvastatin (LIPITOR) 40 MG tablet, TAKE 1 TABLET BY MOUTH ONCE DAILY AT  6  PM (Patient taking differently: Take 40 mg by mouth daily. TAKE 1 TABLET BY MOUTH ONCE DAILY AT  6  PM)   EPINEPHrine (EPIPEN JR) 0.15 MG/0.3ML injection, Inject 0.6 mLs (0.3 mg total) into the muscle once as needed for up to 1 dose for anaphylaxis.   hydrochlorothiazide (HYDRODIURIL) 25 MG tablet, Take 1 tablet (25 mg total) by mouth daily.   losartan (COZAAR) 50 MG tablet, Take 1 tablet (50 mg total) by mouth daily.  Current Outpatient Medications (Respiratory):    montelukast (SINGULAIR) 10 MG tablet, Take 1 tablet (10 mg total) by mouth at bedtime.  Current Outpatient Medications (Analgesics):    acetaminophen (TYLENOL) 500 MG tablet, Take 1,000 mg by mouth every 6 (six) hours as needed for headache.   meloxicam (MOBIC) 15 MG tablet, Take 1 tablet (15 mg total) by mouth daily.   SUMAtriptan (IMITREX) 50 MG tablet, Take 1 tablet (50 mg total) by mouth once as needed for up to 1 dose for migraine. May repeat in 2 hours if headache persists or recurs.   Current Outpatient Medications (Other):    diclofenac Sodium (VOLTAREN) 1 % GEL, Apply 2 g topically 4 (four) times daily.   Multiple Vitamins-Minerals (MULTIVITAMIN WITH MINERALS) tablet, Take 1 tablet by mouth daily.   ondansetron (ZOFRAN ODT) 4 MG disintegrating tablet, Take 1 tablet (4 mg total) by mouth every 8 (eight) hours as needed for nausea or vomiting.   tiZANidine (ZANAFLEX) 4 MG tablet, Take 1 tablet (4 mg total) by mouth at bedtime.   Vitamin D, Ergocalciferol, (DRISDOL) 1.25 MG (50000 UNIT) CAPS capsule, Take 1 capsule (50,000 Units total) by mouth every 7 (seven) days.   Reviewed prior  external information including notes and imaging from  primary care provider As well as notes that were available from care everywhere and other healthcare systems.  Past medical history, social, surgical and family history all reviewed in electronic medical record.  No pertanent information unless stated regarding to the chief complaint.   Review of Systems:  No headache, visual changes, nausea, vomiting, diarrhea, constipation, dizziness, abdominal pain, skin rash, fevers, chills, night sweats, weight loss, swollen lymph nodes, chest pain, shortness of breath, mood changes. POSITIVE muscle aches, body aches, joint swelling  Objective  Blood pressure 130/90, pulse 77, height 5' (1.524 m), SpO2 99 %.   General: No  apparent distress alert and oriented x3 mood and affect normal, dressed appropriately.  Obese HEENT: Pupils equal, extraocular movements intact  Respiratory: Patient's speak in full sentences and does not appear short of breath  Cardiovascular: No lower extremity edema, non tender, no erythema  Gait antalgic MSK: Knee: Bilateral valgus deformity noted. Large thigh to calf ratio.  Tender to palpation over medial and PF joint line.  ROM f limited in flexion and extension instability with valgus force.  painful patellar compression. Patellar glide with moderate crepitus. Patellar and quadriceps tendons unremarkable. Hamstring and quadriceps strength is normal.  After informed written and verbal consent, patient was seated on exam table. Right knee was prepped with alcohol swab and utilizing anterolateral approach, patient's right knee space was injected with 4:1  marcaine 0.5%: Kenalog 40mg /dL. Patient tolerated the procedure well without immediate complications.  After informed written and verbal consent, patient was seated on exam table. Left knee was prepped with alcohol swab and utilizing anterolateral approach, patient's left knee space was injected with 4:1  marcaine 0.5%:  Kenalog 40mg /dL. Patient tolerated the procedure well without immediate complications.    Impression and Recommendations:     The above documentation has been reviewed and is accurate and complete Lyndal Pulley, DO

## 2020-07-05 ENCOUNTER — Ambulatory Visit: Payer: BC Managed Care – PPO | Admitting: Family Medicine

## 2020-07-05 ENCOUNTER — Encounter: Payer: Self-pay | Admitting: Family Medicine

## 2020-07-05 ENCOUNTER — Ambulatory Visit (INDEPENDENT_AMBULATORY_CARE_PROVIDER_SITE_OTHER): Payer: BC Managed Care – PPO

## 2020-07-05 ENCOUNTER — Other Ambulatory Visit: Payer: Self-pay

## 2020-07-05 VITALS — BP 130/90 | HR 77 | Ht 60.0 in

## 2020-07-05 DIAGNOSIS — M170 Bilateral primary osteoarthritis of knee: Secondary | ICD-10-CM | POA: Diagnosis not present

## 2020-07-05 DIAGNOSIS — M79642 Pain in left hand: Secondary | ICD-10-CM

## 2020-07-05 DIAGNOSIS — S63642D Sprain of metacarpophalangeal joint of left thumb, subsequent encounter: Secondary | ICD-10-CM | POA: Diagnosis not present

## 2020-07-05 DIAGNOSIS — M7989 Other specified soft tissue disorders: Secondary | ICD-10-CM | POA: Diagnosis not present

## 2020-07-05 NOTE — Assessment & Plan Note (Signed)
We will get x-ray to make sure nothing else.  Do not see anything significant findings are noted today.

## 2020-07-05 NOTE — Assessment & Plan Note (Signed)
Continues to have knee pain bilaterally.  And severe arthritic changes.  Chronic problem with exacerbation.  Encouraged her to continue with the weight loss.  Patient refused taking her weight today.  Patient does not want to consider the possibility of gastric bypass.  Can only repeat injections every 10 to 12 weeks.

## 2020-07-05 NOTE — Patient Instructions (Addendum)
Good to see you Hand xray today Injected the knees today See me again in 8-10 weeks

## 2020-08-03 DIAGNOSIS — R519 Headache, unspecified: Secondary | ICD-10-CM | POA: Diagnosis not present

## 2020-08-03 DIAGNOSIS — G8929 Other chronic pain: Secondary | ICD-10-CM | POA: Diagnosis not present

## 2020-08-28 ENCOUNTER — Encounter: Payer: Self-pay | Admitting: Family Medicine

## 2020-08-29 DIAGNOSIS — E785 Hyperlipidemia, unspecified: Secondary | ICD-10-CM | POA: Diagnosis not present

## 2020-08-29 DIAGNOSIS — F321 Major depressive disorder, single episode, moderate: Secondary | ICD-10-CM | POA: Diagnosis not present

## 2020-08-29 DIAGNOSIS — E1169 Type 2 diabetes mellitus with other specified complication: Secondary | ICD-10-CM | POA: Diagnosis not present

## 2020-08-29 DIAGNOSIS — E1159 Type 2 diabetes mellitus with other circulatory complications: Secondary | ICD-10-CM | POA: Diagnosis not present

## 2020-08-29 DIAGNOSIS — Z79899 Other long term (current) drug therapy: Secondary | ICD-10-CM | POA: Diagnosis not present

## 2020-08-29 DIAGNOSIS — E559 Vitamin D deficiency, unspecified: Secondary | ICD-10-CM | POA: Diagnosis not present

## 2020-08-29 DIAGNOSIS — I152 Hypertension secondary to endocrine disorders: Secondary | ICD-10-CM | POA: Diagnosis not present

## 2020-08-29 NOTE — Progress Notes (Signed)
[name] [place] Phone: [phone] Subjective:   Lynn Fields, am serving as a scribe for Dr. Hulan Saas.  I'm seeing this patient by the request  of:  Isaac Bliss, Rayford Halsted, MD  CC: Bilateral knee pain  [id number]  07/05/2020 Continues to have knee pain bilaterally.  And severe arthritic changes.  Chronic problem with exacerbation.  Encouraged her to continue with the weight loss.  Patient refused taking her weight today.  Patient does not want to consider the possibility of gastric bypass.  Can only repeat injections every 10 to 12 weeks.  Update 08/30/2020 Lynn Fields is a 50 y.o. female coming in with complaint of B knee pain. Knees are worse than the last visit. 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 Patient states that she feels like her knees were hit by a 2X4. Patient states on the 27th she could not get out of bed on the 27th.  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  Patient is not a surgical candidate secondary to weight.  Continues to try to lose weight.  Patient continues to have instability      Past Medical History:  Diagnosis Date   Allergy    Arthritis    knees   Depression    Diabetes mellitus without complication ([place])    Hypertension    Migraines    Ulcer    Past Surgical History:  Procedure Laterality Date   CESAREAN SECTION  1993   COLONOSCOPY WITH PROPOFOL N/A 08/08/2019   Procedure: COLONOSCOPY WITH PROPOFOL;  Surgeon: Thornton Park, MD;  Location: [facility];  Service: Gastroenterology;  Laterality: N/A;   HYSTEROSCOPY WITH NOVASURE N/A 06/02/2016   Procedure: HYSTEROSCOPY WITH NOVASURE;  Surgeon: Paula Compton, MD;  Location: [facility];  Service: Gynecology;  Laterality: N/A;   TUBAL LIGATION  1993   Social History   Socioeconomic History   Marital status: Married    Spouse  name: Not on file   Number of children: Not on file   Years of education: 16   Highest education level: Not on file  Occupational History   Occupation: Product manager: [employer]  Tobacco Use   Smoking status: Never   Smokeless tobacco: Never  Vaping Use   Vaping Use: Never used  Substance and Sexual Activity   Alcohol use: Not Currently    Comment: occ   Drug use: No   Sexual activity: Yes    Birth control/protection: Surgical  Other Topics Concern   Not on file  Social History Narrative   Regular exercise-yes   Caffeine Use-no   Social Determinants of Health   Financial Resource Strain: Not on file  Food Insecurity: Not on file  Transportation Needs: Not on file  Physical Activity: Not on file  Stress: Not on file  Social Connections: Not on file   Allergies  Allergen Reactions   Iodine Anaphylaxis   Other Anaphylaxis    Hair dye   Shellfish Allergy Anaphylaxis    Closes throat   Codeine Itching   Menthol Hives   Morphine And Related Itching   Penicillins Swelling    Has patient had a PCN reaction causing immediate rash, facial/tongue/throat swelling, SOB or lightheadedness with hypotension: Yes Has patient had a PCN reaction causing severe rash involving mucus membranes or skin necrosis: No Has patient had a PCN reaction  that required hospitalization No Has patient had a PCN reaction occurring within the last 10 years: No If all of the above answers are "NO", then may proceed with Cephalosporin use.   Sudafed [Pseudoephedrine Hcl] Swelling   Azithromycin Rash   Erythromycin Rash   Family History  Problem Relation Age of Onset   Diabetes Mother    Hypertension Mother    Heart disease Mother    Hyperlipidemia Mother    Diabetes Father    Hypertension Father    Heart disease Father    Hyperlipidemia Father    Crohn's disease Father    Cervical cancer Sister    Heart disease Sister    Diabetes Sister    Heart disease Sister     Diabetes Sister    Colon polyps Neg Hx    Esophageal cancer Neg Hx    Stomach cancer Neg Hx    Rectal cancer Neg Hx      Current Outpatient Medications (Cardiovascular):    atorvastatin (LIPITOR) 40 MG tablet, TAKE 1 TABLET BY MOUTH ONCE DAILY AT  6  PM (Patient taking differently: Take 40 mg by mouth daily. TAKE 1 TABLET BY MOUTH ONCE DAILY AT  6  PM)   EPINEPHrine (EPIPEN JR) 0.15 MG/0.3ML injection, Inject 0.6 mLs (0.3 mg total) into the muscle once as needed for up to 1 dose for anaphylaxis.   hydrochlorothiazide (HYDRODIURIL) 25 MG tablet, Take 1 tablet (25 mg total) by mouth daily.   losartan (COZAAR) 50 MG tablet, Take 1 tablet (50 mg total) by mouth daily.  Current Outpatient Medications (Respiratory):    montelukast (SINGULAIR) 10 MG tablet, Take 1 tablet (10 mg total) by mouth at bedtime.  Current Outpatient Medications (Analgesics):    acetaminophen (TYLENOL) 500 MG tablet, Take 1,000 mg by mouth every 6 (six) hours as needed for headache.   meloxicam (MOBIC) 15 MG tablet, Take 1 tablet (15 mg total) by mouth daily.   SUMAtriptan (IMITREX) 50 MG tablet, Take 1 tablet (50 mg total) by mouth once as needed for up to 1 dose for migraine. May repeat in 2 hours if headache persists or recurs.   Current Outpatient Medications (Other):    diclofenac Sodium (VOLTAREN) 1 % GEL, Apply 2 g topically 4 (four) times daily.   Multiple Vitamins-Minerals (MULTIVITAMIN WITH MINERALS) tablet, Take 1 tablet by mouth daily.   ondansetron (ZOFRAN ODT) 4 MG disintegrating tablet, Take 1 tablet (4 mg total) by mouth every 8 (eight) hours as needed for nausea or vomiting.   tiZANidine (ZANAFLEX) 4 MG tablet, Take 1 tablet (4 mg total) by mouth at bedtime.   Vitamin D, Ergocalciferol, (DRISDOL) 1.25 MG (50000 UNIT) CAPS capsule, Take 1 capsule (50,000 Units total) by mouth every 7 (seven) days.   Reviewed prior external information including notes and imaging from  primary care provider As well as  notes that were available from care everywhere and other healthcare systems.  Past medical history, social, surgical and family history all reviewed in electronic medical record.  No pertanent information unless stated regarding to the chief complaint.   Review of Systems:  No headache, visual changes, nausea, vomiting, diarrhea, constipation, dizziness, abdominal pain, skin rash, fevers, chills, night sweats, weight loss, swollen lymph nodes, body aches, joint swelling, chest pain, shortness of breath, mood changes. POSITIVE muscle aches  Objective  Blood pressure 120/80, pulse 90, height 5' (1.524 m), weight 253 lb 8 oz (115 kg), SpO2 98 %.   General: No apparent distress alert  and oriented x3 mood and affect normal, dressed appropriately.  Overweight HEENT: Pupils equal, extraocular movements intact  Respiratory: Patient's speak in full sentences and does not appear short of breath  Cardiovascular: No lower extremity edema, non tender, no erythema  Gait antalgic Knee: bilateral valgus deformity noted. Large thigh to calf ratio.  Tender to palpation over medial and PF joint line.  ROM full in flexion and extension and lower leg rotation. instability with valgus force.  painful patellar compression. Patellar glide with moderate crepitus. Patellar and quadriceps tendons unremarkable. Hamstring and quadriceps strength is normal.  After informed written and verbal consent, patient was seated on exam table. Right knee was prepped with alcohol swab and utilizing anterolateral approach, patient's right knee space was injected with 4:1  marcaine 0.5%: Kenalog '40mg'$ /dL. Patient tolerated the procedure well without immediate complications.  After informed written and verbal consent, patient was seated on exam table. Left knee was prepped with alcohol swab and utilizing anterolateral approach, patient's left knee space was injected with 4:1  marcaine 0.5%: Kenalog '40mg'$ /dL. Patient tolerated the  procedure well without immediate complications.   Impression and Recommendations:     The above documentation has been reviewed and is accurate and complete Lyndal Pulley, DO

## 2020-08-30 ENCOUNTER — Ambulatory Visit: Payer: BC Managed Care – PPO | Admitting: Family Medicine

## 2020-08-30 ENCOUNTER — Other Ambulatory Visit: Payer: Self-pay

## 2020-08-30 ENCOUNTER — Encounter: Payer: Self-pay | Admitting: Family Medicine

## 2020-08-30 DIAGNOSIS — M170 Bilateral primary osteoarthritis of knee: Secondary | ICD-10-CM | POA: Diagnosis not present

## 2020-08-30 NOTE — Assessment & Plan Note (Signed)
Repeat injection given today.  Tolerated the procedure well.  Discussed icing regimen and home exercises, discussed with patient about continuing with the left leg.  Patient will continue with the topical anti-inflammatories.  Increase slowly.  Follow-up again in 6 to 8 weeks.

## 2020-08-30 NOTE — Patient Instructions (Addendum)
Good to see you  Sorry hubby is a loss cause See me again in 8-10 weeks to repeat injections

## 2020-09-07 ENCOUNTER — Encounter: Payer: Self-pay | Admitting: Family Medicine

## 2020-09-17 DIAGNOSIS — E1159 Type 2 diabetes mellitus with other circulatory complications: Secondary | ICD-10-CM | POA: Diagnosis not present

## 2020-09-17 DIAGNOSIS — G8929 Other chronic pain: Secondary | ICD-10-CM | POA: Diagnosis not present

## 2020-09-17 DIAGNOSIS — G44229 Chronic tension-type headache, not intractable: Secondary | ICD-10-CM | POA: Diagnosis not present

## 2020-09-17 DIAGNOSIS — I152 Hypertension secondary to endocrine disorders: Secondary | ICD-10-CM | POA: Diagnosis not present

## 2020-09-17 DIAGNOSIS — G44209 Tension-type headache, unspecified, not intractable: Secondary | ICD-10-CM | POA: Diagnosis not present

## 2020-09-17 DIAGNOSIS — G4733 Obstructive sleep apnea (adult) (pediatric): Secondary | ICD-10-CM | POA: Diagnosis not present

## 2020-10-12 DIAGNOSIS — M25561 Pain in right knee: Secondary | ICD-10-CM | POA: Diagnosis not present

## 2020-10-12 DIAGNOSIS — M25562 Pain in left knee: Secondary | ICD-10-CM | POA: Diagnosis not present

## 2020-10-12 DIAGNOSIS — I152 Hypertension secondary to endocrine disorders: Secondary | ICD-10-CM | POA: Diagnosis not present

## 2020-10-12 DIAGNOSIS — G8929 Other chronic pain: Secondary | ICD-10-CM | POA: Diagnosis not present

## 2020-10-12 DIAGNOSIS — E1159 Type 2 diabetes mellitus with other circulatory complications: Secondary | ICD-10-CM | POA: Diagnosis not present

## 2020-10-24 NOTE — Progress Notes (Signed)
[place] Phone: [phone] Subjective:   [name], am serving as a scribe for Dr. Hulan Saas.  This visit occurred during the SARS-CoV-2 public health emergency.  Safety protocols were in place, including screening questions prior to the visit, additional usage of staff PPE, and extensive cleaning of exam room while observing appropriate contact time as indicated for disinfecting solutions.   I'm seeing this patient by the request  of:  Isaac Bliss, Rayford Halsted, MD  CC: Bilateral knee pain  [id number]  08/30/2020 Repeat injection given today.  Tolerated the procedure well.  Discussed icing regimen and home exercises, discussed with patient about continuing with the left leg.  Patient will continue with the topical anti-inflammatories.  Increase slowly.  Follow-up again in 6 to 8 weeks.  Updated 10/25/2020 Shereena Berquist is a 50 y.o. female coming in with complaint of bilateral knee pain.  Known severe end-stage osteoarthritic changes of the knees bilaterally.  Chronic problem with exacerbation.  Patient is still attempting to lose weight.  Patient would need to get her weight down to be a surgical candidate.  Patient states that she is having burning pain in R knee for past 10 days. No new injury. Antalgic gait. Using Tramadol for pain relief. Injections at last visit did not last as long as they previously have.       Past Medical History:  Diagnosis Date   Allergy    Arthritis    knees   Depression    Diabetes mellitus without complication ([place])    Hypertension    Migraines    Ulcer    Past Surgical History:  Procedure Laterality Date   CESAREAN SECTION  1993   COLONOSCOPY WITH PROPOFOL N/A 08/08/2019   Procedure: COLONOSCOPY WITH PROPOFOL;  Surgeon: Thornton Park, MD;  Location: [facility];  Service: Gastroenterology;  Laterality: N/A;   HYSTEROSCOPY WITH NOVASURE N/A 06/02/2016    Procedure: HYSTEROSCOPY WITH NOVASURE;  Surgeon: Paula Compton, MD;  Location: [facility];  Service: Gynecology;  Laterality: N/A;   TUBAL LIGATION  1993   Social History   Socioeconomic History   Marital status: Married    Spouse name: Not on file   Number of children: Not on file   Years of education: 16   Highest education level: Not on file  Occupational History   Occupation: Product manager: [employer]  Tobacco Use   Smoking status: Never   Smokeless tobacco: Never  Vaping Use   Vaping Use: Never used  Substance and Sexual Activity   Alcohol use: Not Currently    Comment: occ   Drug use: No   Sexual activity: Yes    Birth control/protection: Surgical  Other Topics Concern   Not on file  Social History Narrative   Regular exercise-yes   Caffeine Use-no   Social Determinants of Health   Financial Resource Strain: Not on file  Food Insecurity: Not on file  Transportation Needs: Not on file  Physical Activity: Not on file  Stress: Not on file  Social Connections: Not on file   Allergies  Allergen Reactions   Iodine Anaphylaxis   Other Anaphylaxis    Hair dye   Shellfish Allergy Anaphylaxis    Closes throat   Codeine Itching   Menthol Hives   Morphine And Related Itching   Penicillins Swelling    Has patient had a PCN reaction causing immediate rash, facial/tongue/throat swelling,  SOB or lightheadedness with hypotension: Yes Has patient had a PCN reaction causing severe rash involving mucus membranes or skin necrosis: No Has patient had a PCN reaction that required hospitalization No Has patient had a PCN reaction occurring within the last 10 years: No If all of the above answers are "NO", then may proceed with Cephalosporin use.   Sudafed [Pseudoephedrine Hcl] Swelling   Azithromycin Rash   Erythromycin Rash   Family History  Problem Relation Age of Onset   Diabetes Mother    Hypertension Mother    Heart disease Mother     Hyperlipidemia Mother    Diabetes Father    Hypertension Father    Heart disease Father    Hyperlipidemia Father    Crohn's disease Father    Cervical cancer Sister    Heart disease Sister    Diabetes Sister    Heart disease Sister    Diabetes Sister    Colon polyps Neg Hx    Esophageal cancer Neg Hx    Stomach cancer Neg Hx    Rectal cancer Neg Hx      Current Outpatient Medications (Cardiovascular):    atorvastatin (LIPITOR) 40 MG tablet, TAKE 1 TABLET BY MOUTH ONCE DAILY AT  6  PM (Patient taking differently: Take 40 mg by mouth daily. TAKE 1 TABLET BY MOUTH ONCE DAILY AT  6  PM)   EPINEPHrine (EPIPEN JR) 0.15 MG/0.3ML injection, Inject 0.6 mLs (0.3 mg total) into the muscle once as needed for up to 1 dose for anaphylaxis.   hydrochlorothiazide (HYDRODIURIL) 25 MG tablet, Take 1 tablet (25 mg total) by mouth daily.   losartan (COZAAR) 50 MG tablet, Take 1 tablet (50 mg total) by mouth daily.  Current Outpatient Medications (Respiratory):    montelukast (SINGULAIR) 10 MG tablet, Take 1 tablet (10 mg total) by mouth at bedtime.  Current Outpatient Medications (Analgesics):    acetaminophen (TYLENOL) 500 MG tablet, Take 1,000 mg by mouth every 6 (six) hours as needed for headache.   meloxicam (MOBIC) 15 MG tablet, Take 1 tablet (15 mg total) by mouth daily.   SUMAtriptan (IMITREX) 50 MG tablet, Take 1 tablet (50 mg total) by mouth once as needed for up to 1 dose for migraine. May repeat in 2 hours if headache persists or recurs.   Current Outpatient Medications (Other):    diclofenac Sodium (VOLTAREN) 1 % GEL, Apply 2 g topically 4 (four) times daily.   Multiple Vitamins-Minerals (MULTIVITAMIN WITH MINERALS) tablet, Take 1 tablet by mouth daily.   ondansetron (ZOFRAN ODT) 4 MG disintegrating tablet, Take 1 tablet (4 mg total) by mouth every 8 (eight) hours as needed for nausea or vomiting.   tiZANidine (ZANAFLEX) 4 MG tablet, Take 1 tablet (4 mg total) by mouth at bedtime.    Vitamin D, Ergocalciferol, (DRISDOL) 1.25 MG (50000 UNIT) CAPS capsule, Take 1 capsule (50,000 Units total) by mouth every 7 (seven) days.   Reviewed prior external information including notes and imaging from  primary care provider As well as notes that were available from care everywhere and other healthcare systems.  Past medical history, social, surgical and family history all reviewed in electronic medical record.  No pertanent information unless stated regarding to the chief complaint.   Review of Systems:  No headache, visual changes, nausea, vomiting, diarrhea, constipation, dizziness, abdominal pain, skin rash, fevers, chills, night sweats, weight loss, swollen lymph nodes, body aches, chest pain, shortness of breath, mood changes. POSITIVE muscle aches, joint swelling  Objective  Blood pressure 118/82, pulse 94, height 5' (1.524 m), weight 248 lb (112.5 kg), SpO2 98 %.   General: No apparent distress alert and oriented x3 mood and affect normal, dressed appropriately.  Overweight HEENT: Pupils equal, extraocular movements intact  Respiratory: Patient's speak in full sentences and does not appear short of breath  Cardiovascular: No lower extremity edema, non tender, no erythema  Gait severely antalgic MSK: Bilateral knee exam shows the patient does have trace effusion noted right greater than left.  Patient does have decreased range of motion secondary to body habitus and pain.  Instability noted with valgus and varus force.  After informed written and verbal consent, patient was seated on exam table. Right knee was prepped with alcohol swab and utilizing anterolateral approach, patient's right knee space was injected with 4:1  marcaine 0.5%: Kenalog 40mg /dL. Patient tolerated the procedure well without immediate complications.  After informed written and verbal consent, patient was seated on exam table. Right knee was prepped with alcohol swab and utilizing anterolateral approach,  patient's right knee space was injected with 4:1  marcaine 0.5%: Kenalog 40mg /dL. Patient tolerated the procedure well without immediate complications.    Impression and Recommendations:     The above documentation has been reviewed and is accurate and complete Lyndal Pulley, DO

## 2020-10-25 ENCOUNTER — Encounter: Payer: Self-pay | Admitting: Family Medicine

## 2020-10-25 ENCOUNTER — Other Ambulatory Visit: Payer: Self-pay

## 2020-10-25 ENCOUNTER — Ambulatory Visit: Payer: BC Managed Care – PPO | Admitting: Family Medicine

## 2020-10-25 DIAGNOSIS — M170 Bilateral primary osteoarthritis of knee: Secondary | ICD-10-CM

## 2020-10-25 NOTE — Assessment & Plan Note (Signed)
Repeat injections given again today.  Tolerated the procedure well.  Discussed icing regimen and home exercises.  Discussed which activities to do which wants to avoid.  Follow-up with me again in 8 to 10 weeks.

## 2020-10-25 NOTE — Patient Instructions (Signed)
Take Tramadol with Tylenol See me in 8-10 weeks

## 2021-01-24 NOTE — Progress Notes (Signed)
[place] Phone: [phone] Subjective:   [name], am serving as a scribe for Dr. Hulan Saas. This visit occurred during the SARS-CoV-2 public health emergency.  Safety protocols were in place, including screening questions prior to the visit, additional usage of staff PPE, and extensive cleaning of exam room while observing appropriate contact time as indicated for disinfecting solutions.  I'm seeing this patient by the request  of:  Pcp, No  CC: bilateral knee pain   [id number]  10/25/2020 Repeat injections given again today.  Tolerated the procedure well.  Discussed icing regimen and home exercises.  Discussed which activities to do which wants to avoid.  Follow-up with me again in 8 to 10 weeks.  Updated 01/29/2021 Tocara Mennen is a 50 y.o. female coming in with complaint of bilateral knee pain. Continues to have B knee pain. Would like injections today.  Patient has not been feeling like herself still.  Did review patient's chart and patient did have a syncopal episode that at an outside facility.  Since that time has not had any difficulty.  He is following up with cardiology in the near future.       Past Medical History:  Diagnosis Date   Allergy    Arthritis    knees   Depression    Diabetes mellitus without complication ([place])    Hypertension    Migraines    Ulcer    Past Surgical History:  Procedure Laterality Date   CESAREAN SECTION  1993   COLONOSCOPY WITH PROPOFOL N/A 08/08/2019   Procedure: COLONOSCOPY WITH PROPOFOL;  Surgeon: Thornton Park, MD;  Location: [facility];  Service: Gastroenterology;  Laterality: N/A;   HYSTEROSCOPY WITH NOVASURE N/A 06/02/2016   Procedure: HYSTEROSCOPY WITH NOVASURE;  Surgeon: Paula Compton, MD;  Location: [facility];  Service: Gynecology;  Laterality: N/A;   TUBAL LIGATION  1993   Social History   Socioeconomic History   Marital status:  Married    Spouse name: Not on file   Number of children: Not on file   Years of education: 16   Highest education level: Not on file  Occupational History   Occupation: Product manager: [employer]  Tobacco Use   Smoking status: Never   Smokeless tobacco: Never  Vaping Use   Vaping Use: Never used  Substance and Sexual Activity   Alcohol use: Not Currently    Comment: occ   Drug use: No   Sexual activity: Yes    Birth control/protection: Surgical  Other Topics Concern   Not on file  Social History Narrative   Regular exercise-yes   Caffeine Use-no   Social Determinants of Health   Financial Resource Strain: Not on file  Food Insecurity: Not on file  Transportation Needs: Not on file  Physical Activity: Not on file  Stress: Not on file  Social Connections: Not on file   Allergies  Allergen Reactions   Iodine Anaphylaxis   Other Anaphylaxis    Hair dye   Shellfish Allergy Anaphylaxis    Closes throat   Codeine Itching   Menthol Hives   Morphine And Related Itching   Penicillins Swelling    Has patient had a PCN reaction causing immediate rash, facial/tongue/throat swelling, SOB or lightheadedness with hypotension: Yes Has patient had a PCN reaction causing severe rash involving mucus membranes or skin necrosis: No Has patient had a PCN reaction that required hospitalization  No Has patient had a PCN reaction occurring within the last 10 years: No If all of the above answers are "NO", then may proceed with Cephalosporin use.   Sudafed [Pseudoephedrine Hcl] Swelling   Azithromycin Rash   Erythromycin Rash   Family History  Problem Relation Age of Onset   Diabetes Mother    Hypertension Mother    Heart disease Mother    Hyperlipidemia Mother    Diabetes Father    Hypertension Father    Heart disease Father    Hyperlipidemia Father    Crohn's disease Father    Cervical cancer Sister    Heart disease Sister    Diabetes Sister    Heart  disease Sister    Diabetes Sister    Colon polyps Neg Hx    Esophageal cancer Neg Hx    Stomach cancer Neg Hx    Rectal cancer Neg Hx      Current Outpatient Medications (Cardiovascular):    atorvastatin (LIPITOR) 40 MG tablet, TAKE 1 TABLET BY MOUTH ONCE DAILY AT  6  PM (Patient taking differently: Take 40 mg by mouth daily. TAKE 1 TABLET BY MOUTH ONCE DAILY AT  6  PM)   EPINEPHrine (EPIPEN JR) 0.15 MG/0.3ML injection, Inject 0.6 mLs (0.3 mg total) into the muscle once as needed for up to 1 dose for anaphylaxis.   hydrochlorothiazide (HYDRODIURIL) 25 MG tablet, Take 1 tablet (25 mg total) by mouth daily.   losartan (COZAAR) 50 MG tablet, Take 1 tablet (50 mg total) by mouth daily.  Current Outpatient Medications (Respiratory):    montelukast (SINGULAIR) 10 MG tablet, Take 1 tablet (10 mg total) by mouth at bedtime.  Current Outpatient Medications (Analgesics):    acetaminophen (TYLENOL) 500 MG tablet, Take 1,000 mg by mouth every 6 (six) hours as needed for headache.   meloxicam (MOBIC) 15 MG tablet, Take 1 tablet (15 mg total) by mouth daily.   SUMAtriptan (IMITREX) 50 MG tablet, Take 1 tablet (50 mg total) by mouth once as needed for up to 1 dose for migraine. May repeat in 2 hours if headache persists or recurs.   Current Outpatient Medications (Other):    diclofenac Sodium (VOLTAREN) 1 % GEL, Apply 2 g topically 4 (four) times daily.   Multiple Vitamins-Minerals (MULTIVITAMIN WITH MINERALS) tablet, Take 1 tablet by mouth daily.   ondansetron (ZOFRAN ODT) 4 MG disintegrating tablet, Take 1 tablet (4 mg total) by mouth every 8 (eight) hours as needed for nausea or vomiting.   tiZANidine (ZANAFLEX) 4 MG tablet, Take 1 tablet (4 mg total) by mouth at bedtime.   Vitamin D, Ergocalciferol, (DRISDOL) 1.25 MG (50000 UNIT) CAPS capsule, Take 1 capsule (50,000 Units total) by mouth every 7 (seven) days.    Review of Systems:  No headache, visual changes, nausea, vomiting, diarrhea,  constipation, dizziness, abdominal pain, skin rash, fevers, chills, night sweats, weight loss, swollen lymph nodes, body aches, joint swelling, chest pain, shortness of breath, mood changes. POSITIVE muscle aches  Objective  Blood pressure 106/76, pulse 99, height 5' (1.524 m), weight 256 lb (116.1 kg), SpO2 97 %.   General: No apparent distress alert and oriented x3 mood and affect normal, dressed appropriately.  HEENT: Pupils equal, extraocular movements intact  Respiratory: Patient's speak in full sentences and does not appear short of breath  Cardiovascular: No lower extremity edema, non tender, no erythema  Gait normal with good balance and coordination.  MSK: Bilateral knees do have arthritic changes.  Difficult to  assess secondary to patient's body habitus.  Does have limited range of motion.  After informed written and verbal consent, patient was seated on exam table. Right knee was prepped with alcohol swab and utilizing anterolateral approach, patient's right knee space was injected with 4:1  marcaine 0.5%: toradol 30mg /dL. Patient tolerated the procedure well without immediate complications.  After informed written and verbal consent, patient was seated on exam table. Left knee was prepped with alcohol swab and utilizing anterolateral approach, patient's left knee space was injected with 4:1  marcaine 0.5%: toradol  30mg /dL. Patient tolerated the procedure well without immediate complications.    Impression and Recommendations:     The above documentation has been reviewed and is accurate and complete Lyndal Pulley, DO

## 2021-01-29 ENCOUNTER — Encounter: Payer: Self-pay | Admitting: Family Medicine

## 2021-01-29 ENCOUNTER — Other Ambulatory Visit: Payer: Self-pay

## 2021-01-29 ENCOUNTER — Ambulatory Visit (INDEPENDENT_AMBULATORY_CARE_PROVIDER_SITE_OTHER): Payer: PRIVATE HEALTH INSURANCE | Admitting: Family Medicine

## 2021-01-29 DIAGNOSIS — M170 Bilateral primary osteoarthritis of knee: Secondary | ICD-10-CM | POA: Diagnosis not present

## 2021-01-29 NOTE — Patient Instructions (Signed)
Injections today See me in 4-5 weeks

## 2021-01-29 NOTE — Telephone Encounter (Signed)
Responded to patient via mychart that we could take a look at her hand

## 2021-01-29 NOTE — Assessment & Plan Note (Signed)
Patient has chronic problem with exacerbation.  Due to patient having elevated blood sugars at this time.  I feel that we will try this at the moment and in 4 weeks after she sees cardiology and find a reason for her syncopal episode then we can consider the possibility of injections.  Follow-up with me again in 4-6 encourage patient continue to work on the weight loss.

## 2021-02-27 NOTE — Progress Notes (Signed)
Lynn Fields [address] [place] Phone: [phone] Subjective:   IVilma Fields, am serving as a scribe for Dr. Hulan Saas. This visit occurred during the SARS-CoV-2 public health emergency.  Safety protocols were in place, including screening questions prior to the visit, additional usage of staff PPE, and extensive cleaning of exam room while observing appropriate contact time as indicated for disinfecting solutions.   I'm seeing this patient by the request  of:  Pcp, No  CC: Knee pain follow-up  AST:[id number]  01/29/2021 Patient has chronic problem with exacerbation.  Due to patient having elevated blood sugars at this time.  I feel that we will try this at the moment and in 4 weeks after she sees cardiology and find a reason for her syncopal episode then we can consider the possibility of injections.  Follow-up with me again in 4-6 encourage patient continue to work on the weight loss.  Updated 03/05/2021 Lynn Fields is a 51 y.o. female coming in with complaint of bilateral knee pain. Injections didn't do well this time around. No other complaints patient describes the pain as a dull, throbbing aching pain.  Affecting daily activities.  Stopping her from certain activities because of it.  Making her job more difficulty as well.  Also making it very difficult to lose weight.       Past Medical History:  Diagnosis Date   Allergy    Arthritis    knees   Depression    Diabetes mellitus without complication ([place])    Hypertension    Migraines    Ulcer    Past Surgical History:  Procedure Laterality Date   CESAREAN SECTION  1993   COLONOSCOPY WITH PROPOFOL N/A 08/08/2019   Procedure: COLONOSCOPY WITH PROPOFOL;  Surgeon: Thornton Park, MD;  Location: [facility];  Service: Gastroenterology;  Laterality: N/A;   HYSTEROSCOPY WITH NOVASURE N/A 06/02/2016   Procedure: HYSTEROSCOPY WITH NOVASURE;  Surgeon: Paula Compton, MD;   Location: [facility];  Service: Gynecology;  Laterality: N/A;   TUBAL LIGATION  1993   Social History   Socioeconomic History   Marital status: Married    Spouse name: Not on file   Number of children: Not on file   Years of education: 16   Highest education level: Not on file  Occupational History   Occupation: Product manager: [employer]  Tobacco Use   Smoking status: Never   Smokeless tobacco: Never  Vaping Use   Vaping Use: Never used  Substance and Sexual Activity   Alcohol use: Not Currently    Comment: occ   Drug use: No   Sexual activity: Yes    Birth control/protection: Surgical  Other Topics Concern   Not on file  Social History Narrative   Regular exercise-yes   Caffeine Use-no   Social Determinants of Health   Financial Resource Strain: Not on file  Food Insecurity: Not on file  Transportation Needs: Not on file  Physical Activity: Not on file  Stress: Not on file  Social Connections: Not on file   Allergies  Allergen Reactions   Iodine Anaphylaxis   Other Anaphylaxis    Hair dye   Shellfish Allergy Anaphylaxis    Closes throat   Codeine Itching   Menthol Hives   Morphine And Related Itching   Penicillins Swelling    Has patient had a PCN reaction causing immediate rash, facial/tongue/throat swelling, SOB or lightheadedness with hypotension: Yes  Has patient had a PCN reaction causing severe rash involving mucus membranes or skin necrosis: No Has patient had a PCN reaction that required hospitalization No Has patient had a PCN reaction occurring within the last 10 years: No If all of the above answers are "NO", then may proceed with Cephalosporin use.   Sudafed [Pseudoephedrine Hcl] Swelling   Azithromycin Rash   Erythromycin Rash   Family History  Problem Relation Age of Onset   Diabetes Mother    Hypertension Mother    Heart disease Mother    Hyperlipidemia Mother    Diabetes Father    Hypertension Father    Heart disease  Father    Hyperlipidemia Father    Crohn's disease Father    Cervical cancer Sister    Heart disease Sister    Diabetes Sister    Heart disease Sister    Diabetes Sister    Colon polyps Neg Hx    Esophageal cancer Neg Hx    Stomach cancer Neg Hx    Rectal cancer Neg Hx      Current Outpatient Medications (Cardiovascular):    atorvastatin (LIPITOR) 40 MG tablet, TAKE 1 TABLET BY MOUTH ONCE DAILY AT  6  PM (Patient taking differently: Take 40 mg by mouth daily. TAKE 1 TABLET BY MOUTH ONCE DAILY AT  6  PM)   EPINEPHrine (EPIPEN JR) 0.15 MG/0.3ML injection, Inject 0.6 mLs (0.3 mg total) into the muscle once as needed for up to 1 dose for anaphylaxis.   hydrochlorothiazide (HYDRODIURIL) 25 MG tablet, Take 1 tablet (25 mg total) by mouth daily.   losartan (COZAAR) 50 MG tablet, Take 1 tablet (50 mg total) by mouth daily.  Current Outpatient Medications (Respiratory):    montelukast (SINGULAIR) 10 MG tablet, Take 1 tablet (10 mg total) by mouth at bedtime.  Current Outpatient Medications (Analgesics):    acetaminophen (TYLENOL) 500 MG tablet, Take 1,000 mg by mouth every 6 (six) hours as needed for headache.   meloxicam (MOBIC) 15 MG tablet, Take 1 tablet (15 mg total) by mouth daily.   SUMAtriptan (IMITREX) 50 MG tablet, Take 1 tablet (50 mg total) by mouth once as needed for up to 1 dose for migraine. May repeat in 2 hours if headache persists or recurs.   Current Outpatient Medications (Other):    diclofenac Sodium (VOLTAREN) 1 % GEL, Apply 2 g topically 4 (four) times daily.   Multiple Vitamins-Minerals (MULTIVITAMIN WITH MINERALS) tablet, Take 1 tablet by mouth daily.   ondansetron (ZOFRAN ODT) 4 MG disintegrating tablet, Take 1 tablet (4 mg total) by mouth every 8 (eight) hours as needed for nausea or vomiting.   tiZANidine (ZANAFLEX) 4 MG tablet, Take 1 tablet (4 mg total) by mouth at bedtime.   Vitamin D, Ergocalciferol, (DRISDOL) 1.25 MG (50000 UNIT) CAPS capsule, Take 1 capsule  (50,000 Units total) by mouth every 7 (seven) days.    Review of Systems:  No headache, visual changes, nausea, vomiting, diarrhea, constipation, dizziness, abdominal pain, skin rash, fevers, chills, night sweats, weight loss, swollen lymph nodes, chest pain, shortness of breath, mood changes. POSITIVE muscle aches, body aches, joint swelling  Objective  Blood pressure 118/78, pulse 86, height 5' (1.524 m), weight 259 lb (117.5 kg), SpO2 97 %.   General: No apparent distress alert and oriented x3 mood and affect normal, dressed appropriately.  HEENT: Pupils equal, extraocular movements intact  Respiratory: Patient's speak in full sentences and does not appear short of breath  Cardiovascular: No lower  extremity edema, non tender, no erythema  Gait antalgic Knee: Bilateral valgus deformity noted.  Significantly abnormal thigh to calf ratio.  Tender to palpation over medial and PF joint line.  Limited range of motion overall. instability with valgus force.  painful patellar compression. Patellar glide with moderate crepitus. Patellar and quadriceps tendons unremarkable. Hamstring and quadriceps strength is normal.  After informed written and verbal consent, patient was seated on exam table. Right knee was prepped with alcohol swab and utilizing anterolateral approach, patient's right knee space was injected with 4:1  marcaine 0.5%: Kenalog 40mg /dL. Patient tolerated the procedure well without immediate complications.  After informed written and verbal consent, patient was seated on exam table. Left knee was prepped with alcohol swab and utilizing anterolateral approach, patient's left knee space was injected with 4:1  marcaine 0.5%: Kenalog 40mg /dL. Patient tolerated the procedure well without immediate complications.    Impression and Recommendations:     The above documentation has been reviewed and is accurate and complete Lyndal Pulley, DO

## 2021-03-05 ENCOUNTER — Other Ambulatory Visit: Payer: Self-pay

## 2021-03-05 ENCOUNTER — Ambulatory Visit (INDEPENDENT_AMBULATORY_CARE_PROVIDER_SITE_OTHER): Payer: PRIVATE HEALTH INSURANCE | Admitting: Family Medicine

## 2021-03-05 DIAGNOSIS — M170 Bilateral primary osteoarthritis of knee: Secondary | ICD-10-CM | POA: Diagnosis not present

## 2021-03-05 NOTE — Patient Instructions (Addendum)
Injections today You know the drill See you again in 2-3 months

## 2021-03-05 NOTE — Assessment & Plan Note (Signed)
Acute on chronic laceration.  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  Still marginal from placement secondary to weight.  Have discussed with patient at great length about the importance of weight loss.  Patient still does not want to do any type of surgical intervention for this.  Discussed icing regimen.  Can repeat every 8 weeks but would like to try to space out the steroids when possible.

## 2021-03-11 ENCOUNTER — Ambulatory Visit: Payer: PRIVATE HEALTH INSURANCE | Admitting: Family Medicine

## 2021-05-14 ENCOUNTER — Ambulatory Visit: Payer: PRIVATE HEALTH INSURANCE | Admitting: Family Medicine

## 2021-05-28 ENCOUNTER — Encounter: Payer: Self-pay | Admitting: Family Medicine

## 2021-05-28 MED ORDER — DICLOFENAC SODIUM 1 % EX GEL: 2.0000 g | Freq: Four times a day (QID) | CUTANEOUS | 0 refills | Status: AC

## 2021-06-28 NOTE — Progress Notes (Unsigned)
Lynn Fields [address] [place] Phone: [phone] Subjective:   IVilma Meckel, am serving as a scribe for Dr. Hulan Saas.  I'm seeing this patient by the request  of:  Pcp, No  CC: bilateral knee pain  INO:[id number]  03/05/2021 Acute on chronic laceration.  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  Still marginal from placement secondary to weight.  Have discussed with patient at great length about the importance of weight loss.  Patient still does not want to do any type of surgical intervention for this.  Discussed icing regimen.  Can repeat every 8 weeks but would like to try to space out the steroids when possible  Updated 07/01/2021 [name] is a 51 y.o. female coming in with complaint of bilateral knee pain. Ready for injections today. No new complaints.  Pain is worsened at this point.  Still attempting to try to lose weight on a regular basis.       Past Medical History:  Diagnosis Date   Allergy    Arthritis    knees   Depression    Diabetes mellitus without complication ([place])    Hypertension    Migraines    Ulcer    Past Surgical History:  Procedure Laterality Date   CESAREAN SECTION  1993   COLONOSCOPY WITH PROPOFOL N/A 08/08/2019   Procedure: COLONOSCOPY WITH PROPOFOL;  Surgeon: Thornton Park, MD;  Location: [facility];  Service: Gastroenterology;  Laterality: N/A;   HYSTEROSCOPY WITH NOVASURE N/A 06/02/2016   Procedure: HYSTEROSCOPY WITH NOVASURE;  Surgeon: Paula Compton, MD;  Location: [facility];  Service: Gynecology;  Laterality: N/A;   TUBAL LIGATION  1993   Social History   Socioeconomic History   Marital status: Married    Spouse name: Not on file   Number of children: Not on file   Years of education: 16   Highest education level: Not on file  Occupational History   Occupation: Product manager: [employer]  Tobacco Use   Smoking status: Never   Smokeless tobacco: Never   Vaping Use   Vaping Use: Never used  Substance and Sexual Activity   Alcohol use: Not Currently    Comment: occ   Drug use: No   Sexual activity: Yes    Birth control/protection: Surgical  Other Topics Concern   Not on file  Social History Narrative   Regular exercise-yes   Caffeine Use-no   Social Determinants of Health   Financial Resource Strain: Not on file  Food Insecurity: Not on file  Transportation Needs: Not on file  Physical Activity: Not on file  Stress: Not on file  Social Connections: Not on file   Allergies  Allergen Reactions   Iodine Anaphylaxis   Other Anaphylaxis    Hair dye   Shellfish Allergy Anaphylaxis    Closes throat   Codeine Itching   Menthol Hives   Morphine And Related Itching   Penicillins Swelling    Has patient had a PCN reaction causing immediate rash, facial/tongue/throat swelling, SOB or lightheadedness with hypotension: Yes Has patient had a PCN reaction causing severe rash involving mucus membranes or skin necrosis: No Has patient had a PCN reaction that required hospitalization No Has patient had a PCN reaction occurring within the last 10 years: No If all of the above answers are "NO", then may proceed with Cephalosporin use.   Sudafed [Pseudoephedrine Hcl] Swelling   Azithromycin Rash   Erythromycin Rash  Family History  Problem Relation Age of Onset   Diabetes Mother    Hypertension Mother    Heart disease Mother    Hyperlipidemia Mother    Diabetes Father    Hypertension Father    Heart disease Father    Hyperlipidemia Father    Crohn's disease Father    Cervical cancer Sister    Heart disease Sister    Diabetes Sister    Heart disease Sister    Diabetes Sister    Colon polyps Neg Hx    Esophageal cancer Neg Hx    Stomach cancer Neg Hx    Rectal cancer Neg Hx      Current Outpatient Medications (Cardiovascular):    atorvastatin (LIPITOR) 40 MG tablet, TAKE 1 TABLET BY MOUTH ONCE DAILY AT  6  PM (Patient  taking differently: Take 40 mg by mouth daily. TAKE 1 TABLET BY MOUTH ONCE DAILY AT  6  PM)   EPINEPHrine (EPIPEN JR) 0.15 MG/0.3ML injection, Inject 0.6 mLs (0.3 mg total) into the muscle once as needed for up to 1 dose for anaphylaxis.   hydrochlorothiazide (HYDRODIURIL) 25 MG tablet, Take 1 tablet (25 mg total) by mouth daily.   losartan (COZAAR) 50 MG tablet, Take 1 tablet (50 mg total) by mouth daily.  Current Outpatient Medications (Respiratory):    montelukast (SINGULAIR) 10 MG tablet, Take 1 tablet (10 mg total) by mouth at bedtime.  Current Outpatient Medications (Analgesics):    acetaminophen (TYLENOL) 500 MG tablet, Take 1,000 mg by mouth every 6 (six) hours as needed for headache.   meloxicam (MOBIC) 15 MG tablet, Take 1 tablet (15 mg total) by mouth daily.   SUMAtriptan (IMITREX) 50 MG tablet, Take 1 tablet (50 mg total) by mouth once as needed for up to 1 dose for migraine. May repeat in 2 hours if headache persists or recurs.   Current Outpatient Medications (Other):    diclofenac Sodium (VOLTAREN) 1 % GEL, Apply 2 g topically 4 (four) times daily.   Multiple Vitamins-Minerals (MULTIVITAMIN WITH MINERALS) tablet, Take 1 tablet by mouth daily.   ondansetron (ZOFRAN ODT) 4 MG disintegrating tablet, Take 1 tablet (4 mg total) by mouth every 8 (eight) hours as needed for nausea or vomiting.   tiZANidine (ZANAFLEX) 4 MG tablet, Take 1 tablet (4 mg total) by mouth at bedtime.   Vitamin D, Ergocalciferol, (DRISDOL) 1.25 MG (50000 UNIT) CAPS capsule, Take 1 capsule (50,000 Units total) by mouth every 7 (seven) days.   Reviewed prior external information including notes and imaging from  primary care provider As well as notes that were available from care everywhere and other healthcare systems.  Past medical history, social, surgical and family history all reviewed in electronic medical record.  No pertanent information unless stated regarding to the chief complaint.   Review of  Systems:  No headache, visual changes, nausea, vomiting, diarrhea, constipation, dizziness, abdominal pain, skin rash, fevers, chills, night sweats, weight loss, swollen lymph nodes, body aches, joint swelling, chest pain, shortness of breath, mood changes. POSITIVE muscle aches  Objective  Blood pressure 118/80, pulse 72, height 5' (1.524 m).   General: No apparent distress alert and oriented x3 mood and affect normal, dressed appropriately.  Overweight HEENT: Pupils equal, extraocular movements intact  Respiratory: Patient's speak in full sentences and does not appear short of breath  Cardiovascular: Trace lower extremity edema, non tender, no erythema  Gait antalgic Knee: Bilateral valgus deformity noted. Large thigh to calf ratio.  Tender to  palpation over medial and PF joint line.  Limited range of motion in extension and flexion instability with valgus force.  painful patellar compression. Patellar glide with moderate crepitus. Patellar and quadriceps tendons unremarkable. Hamstring and quadriceps strength is normal.  After informed written and verbal consent, patient was seated on exam table. Right knee was prepped with alcohol swab and utilizing anterolateral approach, patient's right knee space was injected with 4:1  marcaine 0.5%: Kenalog '40mg'$ /dL. Patient tolerated the procedure well without immediate complications.  After informed written and verbal consent, patient was seated on exam table. Left knee was prepped with alcohol swab and utilizing anterolateral approach, patient's left knee space was injected with 4:1  marcaine 0.5%: Kenalog '40mg'$ /dL. Patient tolerated the procedure well without immediate complications.    Impression and Recommendations:     The above documentation has been reviewed and is accurate and complete Lyndal Pulley, DO

## 2021-07-01 ENCOUNTER — Ambulatory Visit (INDEPENDENT_AMBULATORY_CARE_PROVIDER_SITE_OTHER): Payer: PRIVATE HEALTH INSURANCE | Admitting: Family Medicine

## 2021-07-01 DIAGNOSIS — M170 Bilateral primary osteoarthritis of knee: Secondary | ICD-10-CM | POA: Diagnosis not present

## 2021-07-01 NOTE — Patient Instructions (Addendum)
Injection in knees today Good to see you! You made my day Keep up the progress See you again in 10-12 weeks

## 2021-07-01 NOTE — Assessment & Plan Note (Signed)
A discussed icing regimen and home exercises.  Increase activity slowly.  Chronic problem with worsening symptoms.  Patient is still attempting to lose weight.  Patient will need to lose weight to become a candidate for surgery.  Follow-up with me again 10 to 12 weeks

## 2021-09-12 NOTE — Progress Notes (Signed)
[place] Phone: [phone] Subjective:   Lynn Fields, am serving as a scribe for Dr. Hulan Saas.  I'm seeing this patient by the request  of:  Pcp, Fields  CC: bilateral knee pain   DJT:[id number]  07/01/2021 A discussed icing regimen and home exercises.  Increase activity slowly.  Chronic problem with worsening symptoms.  Patient is still attempting to lose weight.  Patient will need to lose weight to become a candidate for surgery.  Follow-up with me again 10 to 12 weeks  Updated 09/17/2021 Lynn Fields is a 51 y.o. female coming in with complaint of bilateral knee pain and L pinky finger MCP joint pain. Patient states that she went to UC and they said she sprained finger  Patient would like injections in both knees today.      Past Medical History:  Diagnosis Date   Allergy    Arthritis    knees   Depression    Diabetes mellitus without complication ([place])    Hypertension    Migraines    Ulcer    Past Surgical History:  Procedure Laterality Date   CESAREAN SECTION  1993   COLONOSCOPY WITH PROPOFOL N/A 08/08/2019   Procedure: COLONOSCOPY WITH PROPOFOL;  Surgeon: Thornton Park, MD;  Location: [facility];  Service: Gastroenterology;  Laterality: N/A;   HYSTEROSCOPY WITH NOVASURE N/A 06/02/2016   Procedure: HYSTEROSCOPY WITH NOVASURE;  Surgeon: Paula Compton, MD;  Location: [facility];  Service: Gynecology;  Laterality: N/A;   TUBAL LIGATION  1993   Social History   Socioeconomic History   Marital status: Married    Spouse name: Not on file   Number of children: Not on file   Years of education: 16   Highest education level: Not on file  Occupational History   Occupation: Product manager: [employer]  Tobacco Use   Smoking status: Never   Smokeless tobacco: Never  Vaping Use   Vaping Use: Never used  Substance and Sexual Activity   Alcohol use: Not Currently     Comment: occ   Drug use: Fields   Sexual activity: Yes    Birth control/protection: Surgical  Other Topics Concern   Not on file  Social History Narrative   Regular exercise-yes   Caffeine Use-Fields   Social Determinants of Health   Financial Resource Strain: Not on file  Food Insecurity: Not on file  Transportation Needs: Not on file  Physical Activity: Not on file  Stress: Not on file  Social Connections: Not on file   Allergies  Allergen Reactions   Iodine Anaphylaxis   Other Anaphylaxis    Hair dye   Shellfish Allergy Anaphylaxis    Closes throat   Codeine Itching   Menthol Hives   Morphine And Related Itching   Penicillins Swelling    Has patient had a PCN reaction causing immediate rash, facial/tongue/throat swelling, SOB or lightheadedness with hypotension: Yes Has patient had a PCN reaction causing severe rash involving mucus membranes or skin necrosis: Fields Has patient had a PCN reaction that required hospitalization Fields Has patient had a PCN reaction occurring within the last 10 years: Fields If all of the above answers are "Fields", then may proceed with Cephalosporin use.   Sudafed [Pseudoephedrine Hcl] Swelling   Azithromycin Rash   Erythromycin Rash   Family History  Problem Relation Age of Onset   Diabetes Mother    Hypertension Mother  Heart disease Mother    Hyperlipidemia Mother    Diabetes Father    Hypertension Father    Heart disease Father    Hyperlipidemia Father    Crohn's disease Father    Cervical cancer Sister    Heart disease Sister    Diabetes Sister    Heart disease Sister    Diabetes Sister    Colon polyps Neg Hx    Esophageal cancer Neg Hx    Stomach cancer Neg Hx    Rectal cancer Neg Hx      Current Outpatient Medications (Cardiovascular):    atorvastatin (LIPITOR) 40 MG tablet, TAKE 1 TABLET BY MOUTH ONCE DAILY AT  6  PM (Patient taking differently: Take 40 mg by mouth daily. TAKE 1 TABLET BY MOUTH ONCE DAILY AT  6  PM)   EPINEPHrine  (EPIPEN JR) 0.15 MG/0.3ML injection, Inject 0.6 mLs (0.3 mg total) into the muscle once as needed for up to 1 dose for anaphylaxis.   hydrochlorothiazide (HYDRODIURIL) 25 MG tablet, Take 1 tablet (25 mg total) by mouth daily.   losartan (COZAAR) 50 MG tablet, Take 1 tablet (50 mg total) by mouth daily.  Current Outpatient Medications (Respiratory):    montelukast (SINGULAIR) 10 MG tablet, Take 1 tablet (10 mg total) by mouth at bedtime.  Current Outpatient Medications (Analgesics):    acetaminophen (TYLENOL) 500 MG tablet, Take 1,000 mg by mouth every 6 (six) hours as needed for headache.   meloxicam (MOBIC) 15 MG tablet, Take 1 tablet (15 mg total) by mouth daily.   SUMAtriptan (IMITREX) 50 MG tablet, Take 1 tablet (50 mg total) by mouth once as needed for up to 1 dose for migraine. May repeat in 2 hours if headache persists or recurs.   Current Outpatient Medications (Other):    diclofenac Sodium (VOLTAREN) 1 % GEL, Apply 2 g topically 4 (four) times daily.   Multiple Vitamins-Minerals (MULTIVITAMIN WITH MINERALS) tablet, Take 1 tablet by mouth daily.   ondansetron (ZOFRAN ODT) 4 MG disintegrating tablet, Take 1 tablet (4 mg total) by mouth every 8 (eight) hours as needed for nausea or vomiting.   tiZANidine (ZANAFLEX) 4 MG tablet, Take 1 tablet (4 mg total) by mouth at bedtime.   Vitamin D, Ergocalciferol, (DRISDOL) 1.25 MG (50000 UNIT) CAPS capsule, Take 1 capsule (50,000 Units total) by mouth every 7 (seven) days.   Reviewed prior external information including notes and imaging from  primary care provider As well as notes that were available from care everywhere and other healthcare systems.  Did review primary care notes were patient is attempting weight loss.  Also going to weight loss clinic.  Past medical history, social, surgical and family history all reviewed in electronic medical record.  Fields pertanent information unless stated regarding to the chief complaint.   Review of  Systems:  Fields headache, visual changes, nausea, vomiting, diarrhea, constipation, dizziness, abdominal pain, skin rash, fevers, chills, night sweats, weight loss, swollen lymph nodes, body aches, joint swelling, chest pain, shortness of breath, mood changes. POSITIVE muscle aches  Objective  There were Fields vitals taken for this visit.   General: Fields apparent distress alert and oriented x3 mood and affect normal, dressed appropriately.  HEENT: Pupils equal, extraocular movements intact  Respiratory: Patient's speak in full sentences and does not appear short of breath  Cardiovascular: Fields lower extremity edema, non tender, Fields erythema  Knee exam shows shows significant instability of the knees bilaterally.  Difficult to assess secondary to body habitus.  Patient does have limited range of motion.  Left hand exam shows patient does have nodules noted in the A2 pulley of the flexor tendon of the left hand.  Tender to palpation in this area.  Does move in the tendon sheath.  Limited muscular skeletal ultrasound was performed and interpreted by Hulan Saas, M  Limited ultrasound shows some hypoechoic changes within the tendon sheath of the fifth flexor tendon.  Fields cortical irregularity noted. Impression: Calcific trigger nodule  After informed written and verbal consent, patient was seated on exam table. Right knee was prepped with alcohol swab and utilizing anterolateral approach, patient's right knee space was injected with 4:1  marcaine 0.5%: Kenalog '40mg'$ /dL. Patient tolerated the procedure well without immediate complications.  After informed written and verbal consent, patient was seated on exam table. Left knee was prepped with alcohol swab and utilizing anterolateral approach, patient's left knee space was injected with 4:1  marcaine 0.5%: Kenalog '40mg'$ /dL. Patient tolerated the procedure well without immediate complications.  Procedure: Real-time Ultrasound Guided Injection of left flexor  tendon sheath 5th finger  Device: GE Logiq Q7 Ultrasound guided injection is preferred based studies that show increased duration, increased effect, greater accuracy, decreased procedural pain, increased response rate, and decreased cost with ultrasound guided versus blind injection.  Verbal informed consent obtained.  Time-out conducted.  Noted Fields overlying erythema, induration, or other signs of local infection.  Skin prepped in a sterile fashion.  Local anesthesia: Topical Ethyl chloride.  With sterile technique and under real time ultrasound guidance: With a 25-gauge half inch needle injected with 0.5 cc of 0.5% Marcaine and 0.5 cc of Kenalog 40 mg/mL Completed without difficulty  Pain immediately improved  suggesting accurate placement of the medication.  Advised to call if fevers/chills, erythema, induration, drainage, or persistent bleeding.  Impression: Technically successful ultrasound guided injection.    Impression and Recommendations:    The above documentation has been reviewed and is accurate and complete Lyndal Pulley, DO

## 2021-09-16 ENCOUNTER — Encounter: Payer: Self-pay | Admitting: Family Medicine

## 2021-09-17 ENCOUNTER — Ambulatory Visit (INDEPENDENT_AMBULATORY_CARE_PROVIDER_SITE_OTHER): Payer: PRIVATE HEALTH INSURANCE | Admitting: Family Medicine

## 2021-09-17 ENCOUNTER — Encounter: Payer: Self-pay | Admitting: Family Medicine

## 2021-09-17 ENCOUNTER — Ambulatory Visit: Payer: Self-pay

## 2021-09-17 VITALS — BP 114/72 | HR 94 | Ht 60.0 in | Wt 256.0 lb

## 2021-09-17 DIAGNOSIS — M79642 Pain in left hand: Secondary | ICD-10-CM | POA: Diagnosis not present

## 2021-09-17 DIAGNOSIS — M170 Bilateral primary osteoarthritis of knee: Secondary | ICD-10-CM

## 2021-09-17 DIAGNOSIS — M65352 Trigger finger, left little finger: Secondary | ICD-10-CM

## 2021-09-17 NOTE — Assessment & Plan Note (Signed)
Patient given injection and tolerated the procedure well.  On ultrasound did appear to have more of a calcific changes.  Discussed with patient about icing regimen and home exercises otherwise.  Follow-up again in 6 to 8 weeks.

## 2021-09-17 NOTE — Patient Instructions (Signed)
[place] to buddy tape on Thursday See me again in 10-12 weeks

## 2021-09-17 NOTE — Assessment & Plan Note (Signed)
Chronic problem worsening symptoms again.  Bilateral steroid injections given the need for this worsening symptoms.  Patient is attempting to lose weight.  Is down another 3 pounds since last visit.  Discussed which are considering which ones do not work, follow-up with me again 6 to 8 weeks

## 2021-09-21 ENCOUNTER — Encounter: Payer: Self-pay | Admitting: Family Medicine

## 2021-11-27 NOTE — Progress Notes (Signed)
[place] Phone: [phone] Subjective:   Lynn Fields, am serving as a scribe for Dr. Hulan Saas.  I'm seeing this patient by the request  of:  System, Provider Not In  CC: Bilateral knee pain, left hand pain  [id number]  09/17/2021 Patient given injection and tolerated the procedure well.  On ultrasound did appear to have more of a calcific changes.  Discussed with patient about icing regimen and home exercises otherwise.  Follow-up again in 6 to 8 weeks  Chronic problem worsening symptoms again.  Bilateral steroid injections given the need for this worsening symptoms.  Patient is attempting to lose weight.  Is down another 3 pounds since last visit.  Discussed which are considering which ones do not work, follow-up with me again 6 to 8 weeks  Update 12/03/2021 Lynn Fields is a 51 y.o. female coming in with complaint of L hand pinky finger trigger finger and B knee pain. Pinky trigger finger injection August 2023. Patient states that the knee injections only lasted for 3 days. Pain in finger is over MCP joint. Outside of pinky finger has been numb since last injection.       Past Medical History:  Diagnosis Date   Allergy    Arthritis    knees   Depression    Diabetes mellitus without complication ([place])    Hypertension    Migraines    Ulcer    Past Surgical History:  Procedure Laterality Date   CESAREAN SECTION  1993   COLONOSCOPY WITH PROPOFOL N/A 08/08/2019   Procedure: COLONOSCOPY WITH PROPOFOL;  Surgeon: Thornton Park, MD;  Location: [facility];  Service: Gastroenterology;  Laterality: N/A;   HYSTEROSCOPY WITH NOVASURE N/A 06/02/2016   Procedure: HYSTEROSCOPY WITH NOVASURE;  Surgeon: Paula Compton, MD;  Location: [facility];  Service: Gynecology;  Laterality: N/A;   TUBAL LIGATION  1993   Social History   Socioeconomic History   Marital status: Married    Spouse name: Not on file    Number of children: Not on file   Years of education: 16   Highest education level: Not on file  Occupational History   Occupation: Product manager: [employer]  Tobacco Use   Smoking status: Never   Smokeless tobacco: Never  Vaping Use   Vaping Use: Never used  Substance and Sexual Activity   Alcohol use: Not Currently    Comment: occ   Drug use: Fields   Sexual activity: Yes    Birth control/protection: Surgical  Other Topics Concern   Not on file  Social History Narrative   Regular exercise-yes   Caffeine Use-Fields   Social Determinants of Health   Financial Resource Strain: Not on file  Food Insecurity: Not on file  Transportation Needs: Not on file  Physical Activity: Not on file  Stress: Not on file  Social Connections: Not on file   Allergies  Allergen Reactions   Iodine Anaphylaxis   Other Anaphylaxis    Hair dye   Shellfish Allergy Anaphylaxis    Closes throat   Codeine Itching   Menthol Hives   Morphine And Related Itching   Penicillins Swelling    Has patient had a PCN reaction causing immediate rash, facial/tongue/throat swelling, SOB or lightheadedness with hypotension: Yes Has patient had a PCN reaction causing severe rash involving mucus membranes or skin necrosis: Fields Has patient had a PCN reaction that required hospitalization Fields  Has patient had a PCN reaction occurring within the last 10 years: Fields If all of the above answers are "Fields", then may proceed with Cephalosporin use.   Sudafed [Pseudoephedrine Hcl] Swelling   Azithromycin Rash   Erythromycin Rash   Family History  Problem Relation Age of Onset   Diabetes Mother    Hypertension Mother    Heart disease Mother    Hyperlipidemia Mother    Diabetes Father    Hypertension Father    Heart disease Father    Hyperlipidemia Father    Crohn's disease Father    Cervical cancer Sister    Heart disease Sister    Diabetes Sister    Heart disease Sister    Diabetes Sister     Colon polyps Neg Hx    Esophageal cancer Neg Hx    Stomach cancer Neg Hx    Rectal cancer Neg Hx      Current Outpatient Medications (Cardiovascular):    atorvastatin (LIPITOR) 40 MG tablet, TAKE 1 TABLET BY MOUTH ONCE DAILY AT  6  PM (Patient taking differently: Take 40 mg by mouth daily. TAKE 1 TABLET BY MOUTH ONCE DAILY AT  6  PM)   EPINEPHrine (EPIPEN JR) 0.15 MG/0.3ML injection, Inject 0.6 mLs (0.3 mg total) into the muscle once as needed for up to 1 dose for anaphylaxis.   hydrochlorothiazide (HYDRODIURIL) 25 MG tablet, Take 1 tablet (25 mg total) by mouth daily.   losartan (COZAAR) 50 MG tablet, Take 1 tablet (50 mg total) by mouth daily.  Current Outpatient Medications (Respiratory):    montelukast (SINGULAIR) 10 MG tablet, Take 1 tablet (10 mg total) by mouth at bedtime.  Current Outpatient Medications (Analgesics):    acetaminophen (TYLENOL) 500 MG tablet, Take 1,000 mg by mouth every 6 (six) hours as needed for headache.   meloxicam (MOBIC) 15 MG tablet, Take 1 tablet (15 mg total) by mouth daily.   SUMAtriptan (IMITREX) 50 MG tablet, Take 1 tablet (50 mg total) by mouth once as needed for up to 1 dose for migraine. May repeat in 2 hours if headache persists or recurs.   Current Outpatient Medications (Other):    diclofenac Sodium (VOLTAREN) 1 % GEL, Apply 2 g topically 4 (four) times daily.   Multiple Vitamins-Minerals (MULTIVITAMIN WITH MINERALS) tablet, Take 1 tablet by mouth daily.   ondansetron (ZOFRAN ODT) 4 MG disintegrating tablet, Take 1 tablet (4 mg total) by mouth every 8 (eight) hours as needed for nausea or vomiting.   tiZANidine (ZANAFLEX) 4 MG tablet, Take 1 tablet (4 mg total) by mouth at bedtime.   Vitamin D, Ergocalciferol, (DRISDOL) 1.25 MG (50000 UNIT) CAPS capsule, Take 1 capsule (50,000 Units total) by mouth every 7 (seven) days.   Reviewed prior external information including notes and imaging from  primary care provider As well as notes that were  available from care everywhere and other healthcare systems.  Past medical history, social, surgical and family history all reviewed in electronic medical record.  Fields pertanent information unless stated regarding to the chief complaint.   Review of Systems:  Fields headache, visual changes, nausea, vomiting, diarrhea, constipation, dizziness, abdominal pain, skin rash, fevers, chills, night sweats, weight loss, swollen lymph nodes, body aches, joint swelling, chest pain, shortness of breath, mood changes. POSITIVE muscle aches  Objective  Blood pressure 124/78, height 5' (1.524 m).   General: Fields apparent distress alert and oriented x3 mood and affect normal, dressed appropriately.  HEENT: Pupils equal, extraocular movements intact  Respiratory: Patient's speak in full sentences and does not appear short of breath  Cardiovascular: Fields lower extremity edema, non tender, Fields erythema  Bilateral knees do have significant tenderness noted over the medial joint line bilaterally.  Trace effusion of the patellofemoral joint.  Instability noted with valgus and varus force.  Left hand exam shows severely tender to palpation in the very specific part of the left hand.  Fields triggering noted.  Possible small inclusion cyst felt.  Patient's pain is out of proportion.  Limited muscular skeletal ultrasound was performed and interpreted by Hulan Saas, M  Limited ultrasound does show a mild calcific thing that does look like a possible sliver of the likely wood In the soft tissue area, Fields sign of infection   After informed written and verbal consent, patient was seated on exam table. Right knee was prepped with alcohol swab and utilizing anterolateral approach, patient's right knee space was injected with 4:1  marcaine 0.5%: Kenalog '40mg'$ /dL. Patient tolerated the procedure well without immediate complications.  After informed written and verbal consent, patient was seated on exam table. Left knee was prepped with  alcohol swab and utilizing anterolateral approach, patient's left knee space was injected with 4:1  marcaine 0.5%: Kenalog '40mg'$ /dL. Patient tolerated the procedure well without immediate complications.    Impression and Recommendations:     The above documentation has been reviewed and is accurate and complete Lyndal Pulley, DO

## 2021-12-02 ENCOUNTER — Encounter: Payer: Self-pay | Admitting: Family Medicine

## 2021-12-03 ENCOUNTER — Ambulatory Visit: Payer: Self-pay

## 2021-12-03 ENCOUNTER — Ambulatory Visit (INDEPENDENT_AMBULATORY_CARE_PROVIDER_SITE_OTHER): Payer: PRIVATE HEALTH INSURANCE | Admitting: Family Medicine

## 2021-12-03 VITALS — BP 124/78 | Ht 60.0 in

## 2021-12-03 DIAGNOSIS — M79642 Pain in left hand: Secondary | ICD-10-CM

## 2021-12-03 DIAGNOSIS — M79641 Pain in right hand: Secondary | ICD-10-CM

## 2021-12-03 DIAGNOSIS — M170 Bilateral primary osteoarthritis of knee: Secondary | ICD-10-CM

## 2021-12-03 NOTE — Patient Instructions (Signed)
See me in 3 months

## 2021-12-03 NOTE — Assessment & Plan Note (Signed)
On ultrasound today there possibly is a loose body in the area.  We discussed with patient and likely will just take time to potentially work its way out.  No sign of any infectious etiology.  Otherwise can send patient to surgery for further evaluation.  X-rays were unremarkable for any bony abnormality.

## 2021-12-03 NOTE — Assessment & Plan Note (Signed)
Chronic problem again.  Patient is working on weight loss.  Encouraged her to continue to do so.  I am proud of the efforts she is making.  Still noted at the BMI under 40 if possible.  We discussed which activities to do and which ones to avoid.  Increase activity slowly otherwise.  Follow-up again in 6 to 8 weeks.

## 2022-03-04 NOTE — Progress Notes (Unsigned)
[place] Phone: [phone] Subjective:   Lynn Fields, am serving as a scribe for Dr. Hulan Saas.  I'm seeing this patient by the request  of:  System, Provider Not In  CC: Bilateral knee and hand pain  [id number]  12/03/2021 On ultrasound today there possibly is a loose body in the area. We discussed with patient and likely will just take time to potentially work its way out. Fields sign of any infectious etiology. Otherwise can send patient to surgery for further evaluation. X-rays were unremarkable for any bony abnormality   Chronic problem again.  Patient is working on weight loss.  Encouraged her to continue to do so.  I am proud of the efforts she is making.  Still noted at the BMI under 40 if possible.  We discussed which activities to do and which ones to avoid.  Increase activity slowly otherwise.  Follow-up again in 6 to 8 weeks     Updated 03/05/2022 Lynn Fields is a 52 y.o. female coming in with complaint of bilateral knee and right hand pain. Pain is worsening in both knees. Unable to sleep. Walking with antalgic gait. Hand is doing better.     Past Medical History:  Diagnosis Date   Allergy    Arthritis    knees   Depression    Diabetes mellitus without complication ([place])    Hypertension    Migraines    Ulcer    Past Surgical History:  Procedure Laterality Date   CESAREAN SECTION  1993   COLONOSCOPY WITH PROPOFOL N/A 08/08/2019   Procedure: COLONOSCOPY WITH PROPOFOL;  Surgeon: Thornton Park, MD;  Location: [facility];  Service: Gastroenterology;  Laterality: N/A;   HYSTEROSCOPY WITH NOVASURE N/A 06/02/2016   Procedure: HYSTEROSCOPY WITH NOVASURE;  Surgeon: Paula Compton, MD;  Location: [facility];  Service: Gynecology;  Laterality: N/A;   TUBAL LIGATION  1993   Social History   Socioeconomic History   Marital status: Married    Spouse name: Not on file   Number of children:  Not on file   Years of education: 16   Highest education level: Not on file  Occupational History   Occupation: Product manager: [employer]  Tobacco Use   Smoking status: Never   Smokeless tobacco: Never  Vaping Use   Vaping Use: Never used  Substance and Sexual Activity   Alcohol use: Not Currently    Comment: occ   Drug use: Fields   Sexual activity: Yes    Birth control/protection: Surgical  Other Topics Concern   Not on file  Social History Narrative   Regular exercise-yes   Caffeine Use-Fields   Social Determinants of Health   Financial Resource Strain: Not on file  Food Insecurity: Not on file  Transportation Needs: Not on file  Physical Activity: Not on file  Stress: Not on file  Social Connections: Not on file   Allergies  Allergen Reactions   Iodine Anaphylaxis   Other Anaphylaxis    Hair dye   Shellfish Allergy Anaphylaxis    Closes throat   Codeine Itching   Menthol Hives   Morphine And Related Itching   Penicillins Swelling    Has patient had a PCN reaction causing immediate rash, facial/tongue/throat swelling, SOB or lightheadedness with hypotension: Yes Has patient had a PCN reaction causing severe rash involving mucus membranes or skin necrosis: Fields Has patient had a PCN reaction  that required hospitalization Fields Has patient had a PCN reaction occurring within the last 10 years: Fields If all of the above answers are "Fields", then may proceed with Cephalosporin use.   Sudafed [Pseudoephedrine Hcl] Swelling   Azithromycin Rash   Erythromycin Rash   Family History  Problem Relation Age of Onset   Diabetes Mother    Hypertension Mother    Heart disease Mother    Hyperlipidemia Mother    Diabetes Father    Hypertension Father    Heart disease Father    Hyperlipidemia Father    Crohn's disease Father    Cervical cancer Sister    Heart disease Sister    Diabetes Sister    Heart disease Sister    Diabetes Sister    Colon polyps Neg Hx     Esophageal cancer Neg Hx    Stomach cancer Neg Hx    Rectal cancer Neg Hx      Current Outpatient Medications (Cardiovascular):    atorvastatin (LIPITOR) 40 MG tablet, TAKE 1 TABLET BY MOUTH ONCE DAILY AT  6  PM (Patient taking differently: Take 40 mg by mouth daily. TAKE 1 TABLET BY MOUTH ONCE DAILY AT  6  PM)   EPINEPHrine (EPIPEN JR) 0.15 MG/0.3ML injection, Inject 0.6 mLs (0.3 mg total) into the muscle once as needed for up to 1 dose for anaphylaxis.   hydrochlorothiazide (HYDRODIURIL) 25 MG tablet, Take 1 tablet (25 mg total) by mouth daily.   losartan (COZAAR) 50 MG tablet, Take 1 tablet (50 mg total) by mouth daily.  Current Outpatient Medications (Respiratory):    montelukast (SINGULAIR) 10 MG tablet, Take 1 tablet (10 mg total) by mouth at bedtime.  Current Outpatient Medications (Analgesics):    acetaminophen (TYLENOL) 500 MG tablet, Take 1,000 mg by mouth every 6 (six) hours as needed for headache.   meloxicam (MOBIC) 15 MG tablet, Take 1 tablet (15 mg total) by mouth daily.   SUMAtriptan (IMITREX) 50 MG tablet, Take 1 tablet (50 mg total) by mouth once as needed for up to 1 dose for migraine. May repeat in 2 hours if headache persists or recurs.   Current Outpatient Medications (Other):    diclofenac Sodium (VOLTAREN) 1 % GEL, Apply 2 g topically 4 (four) times daily.   Multiple Vitamins-Minerals (MULTIVITAMIN WITH MINERALS) tablet, Take 1 tablet by mouth daily.   ondansetron (ZOFRAN ODT) 4 MG disintegrating tablet, Take 1 tablet (4 mg total) by mouth every 8 (eight) hours as needed for nausea or vomiting.   tiZANidine (ZANAFLEX) 4 MG tablet, Take 1 tablet (4 mg total) by mouth at bedtime.   Vitamin D, Ergocalciferol, (DRISDOL) 1.25 MG (50000 UNIT) CAPS capsule, Take 1 capsule (50,000 Units total) by mouth every 7 (seven) days.   Reviewed prior external information including notes and imaging from  primary care provider As well as notes that were available from care  everywhere and other healthcare systems.  Past medical history, social, surgical and family history all reviewed in electronic medical record.  Fields pertanent information unless stated regarding to the chief complaint.   Review of Systems:  Fields headache, visual changes, nausea, vomiting, diarrhea, constipation, dizziness, abdominal pain, skin rash, fevers, chills, night sweats, weight loss, swollen lymph nodes, body aches, joint swelling, chest pain, shortness of breath, mood changes. POSITIVE muscle aches  Objective  Blood pressure 116/84, pulse 89, height 5' (1.524 m), weight 249 lb (112.9 kg), SpO2 97 %.   General: Fields apparent distress alert and oriented  x3 mood and affect normal, dressed appropriately.  HEENT: Pupils equal, extraocular movements intact  Respiratory: Patient's speak in full sentences and does not appear short of breath  Cardiovascular: Fields lower extremity edema, non tender, Fields erythema  Antalgic gait noted.  Instability of the knees bilaterally.  Trace effusion noted with limited range of motion secondary to voluntary guarding.  After informed written and verbal consent, patient was seated on exam table. Right knee was prepped with alcohol swab and utilizing anterolateral approach, patient's right knee space was injected with 4:1  marcaine 0.5%: Kenalog '40mg'$ /dL. Patient tolerated the procedure well without immediate complications.  After informed written and verbal consent, patient was seated on exam table. Left knee was prepped with alcohol swab and utilizing anterolateral approach, patient's left knee space was injected with 4:1  marcaine 0.5%: Kenalog '40mg'$ /dL. Patient tolerated the procedure well without immediate complications.    Impression and Recommendations:     The above documentation has been reviewed and is accurate and complete Lyndal Pulley, DO

## 2022-03-05 ENCOUNTER — Ambulatory Visit: Payer: Self-pay

## 2022-03-05 ENCOUNTER — Ambulatory Visit: Payer: PRIVATE HEALTH INSURANCE | Admitting: Family Medicine

## 2022-03-05 ENCOUNTER — Encounter: Payer: Self-pay | Admitting: Family Medicine

## 2022-03-05 VITALS — BP 116/84 | HR 89 | Ht 60.0 in | Wt 249.0 lb

## 2022-03-05 DIAGNOSIS — M79642 Pain in left hand: Secondary | ICD-10-CM

## 2022-03-05 DIAGNOSIS — M170 Bilateral primary osteoarthritis of knee: Secondary | ICD-10-CM | POA: Diagnosis not present

## 2022-03-05 NOTE — Patient Instructions (Signed)
Injected both knees See me in 6 weeks for a Toradal injection

## 2022-03-05 NOTE — Assessment & Plan Note (Signed)
Worsening chronic condition.  Discussed HEP  Discussed which activities and in courage patient for the weight loss at this moment.  Due to patient's pain though she is not able to be working out as regularly.  Discussed which activities to do and which ones to avoid.  Increase activity slowly.  Follow-up again in 6 to 8 weeks at that point could be a candidate for Toradol injections.

## 2022-04-15 NOTE — Progress Notes (Unsigned)
[place] Phone: [phone] Subjective:   [name], am serving as a scribe for Dr. Hulan Saas.  I'm seeing this patient by the request  of:  System, Provider Not In  CC: Bilateral knee pain  [id number]  03/05/2022 Worsening chronic condition.  Discussed HEP  Discussed which activities and in courage patient for the weight loss at this moment.  Due to patient's pain though she is not able to be working out as regularly.  Discussed which activities to do and which ones to avoid.  Increase activity slowly.  Follow-up again in 6 to 8 weeks at that point could be a candidate for Toradol injections  Updated 04/16/2022 Kharissa Macbeth is a 52 y.o. female coming in with complaint of B knee pain.  Patient does have end-stage osteoarthritic changes and is attempting to lose weight enough to allow her to have surgical intervention.  6 weeks ago was seen and given steroid injections.  Patient states that her L knee pain has increased more than the R knee. Feels a burning sensation in anterior aspect. Caught her foot on other foot and has more medial pain in the L knee.       Past Medical History:  Diagnosis Date   Allergy    Arthritis    knees   Depression    Diabetes mellitus without complication ([place])    Hypertension    Migraines    Ulcer    Past Surgical History:  Procedure Laterality Date   CESAREAN SECTION  1993   COLONOSCOPY WITH PROPOFOL N/A 08/08/2019   Procedure: COLONOSCOPY WITH PROPOFOL;  Surgeon: Thornton Park, MD;  Location: [facility];  Service: Gastroenterology;  Laterality: N/A;   HYSTEROSCOPY WITH NOVASURE N/A 06/02/2016   Procedure: HYSTEROSCOPY WITH NOVASURE;  Surgeon: Paula Compton, MD;  Location: [facility];  Service: Gynecology;  Laterality: N/A;   TUBAL LIGATION  1993   Social History   Socioeconomic History   Marital status: Married    Spouse name: Not on file   Number of  children: Not on file   Years of education: 16   Highest education level: Not on file  Occupational History   Occupation: Product manager: [employer]  Tobacco Use   Smoking status: Never   Smokeless tobacco: Never  Vaping Use   Vaping Use: Never used  Substance and Sexual Activity   Alcohol use: Not Currently    Comment: occ   Drug use: No   Sexual activity: Yes    Birth control/protection: Surgical  Other Topics Concern   Not on file  Social History Narrative   Regular exercise-yes   Caffeine Use-no   Social Determinants of Health   Financial Resource Strain: Not on file  Food Insecurity: Not on file  Transportation Needs: Not on file  Physical Activity: Not on file  Stress: Not on file  Social Connections: Not on file   Allergies  Allergen Reactions   Iodine Anaphylaxis   Other Anaphylaxis    Hair dye   Shellfish Allergy Anaphylaxis    Closes throat   Codeine Itching   Menthol Hives   Morphine And Related Itching   Penicillins Swelling    Has patient had a PCN reaction causing immediate rash, facial/tongue/throat swelling, SOB or lightheadedness with hypotension: Yes Has patient had a PCN reaction causing severe rash involving mucus membranes or skin necrosis: No Has patient had a PCN reaction  that required hospitalization No Has patient had a PCN reaction occurring within the last 10 years: No If all of the above answers are "NO", then may proceed with Cephalosporin use.   Sudafed [Pseudoephedrine Hcl] Swelling   Azithromycin Rash   Erythromycin Rash   Family History  Problem Relation Age of Onset   Diabetes Mother    Hypertension Mother    Heart disease Mother    Hyperlipidemia Mother    Diabetes Father    Hypertension Father    Heart disease Father    Hyperlipidemia Father    Crohn's disease Father    Cervical cancer Sister    Heart disease Sister    Diabetes Sister    Heart disease Sister    Diabetes Sister    Colon polyps  Neg Hx    Esophageal cancer Neg Hx    Stomach cancer Neg Hx    Rectal cancer Neg Hx      Current Outpatient Medications (Cardiovascular):    atorvastatin (LIPITOR) 40 MG tablet, TAKE 1 TABLET BY MOUTH ONCE DAILY AT  6  PM (Patient taking differently: Take 40 mg by mouth daily. TAKE 1 TABLET BY MOUTH ONCE DAILY AT  6  PM)   EPINEPHrine (EPIPEN JR) 0.15 MG/0.3ML injection, Inject 0.6 mLs (0.3 mg total) into the muscle once as needed for up to 1 dose for anaphylaxis.   hydrochlorothiazide (HYDRODIURIL) 25 MG tablet, Take 1 tablet (25 mg total) by mouth daily.   losartan (COZAAR) 50 MG tablet, Take 1 tablet (50 mg total) by mouth daily.  Current Outpatient Medications (Respiratory):    montelukast (SINGULAIR) 10 MG tablet, Take 1 tablet (10 mg total) by mouth at bedtime.  Current Outpatient Medications (Analgesics):    acetaminophen (TYLENOL) 500 MG tablet, Take 1,000 mg by mouth every 6 (six) hours as needed for headache.   meloxicam (MOBIC) 15 MG tablet, Take 1 tablet (15 mg total) by mouth daily.   SUMAtriptan (IMITREX) 50 MG tablet, Take 1 tablet (50 mg total) by mouth once as needed for up to 1 dose for migraine. May repeat in 2 hours if headache persists or recurs.   Current Outpatient Medications (Other):    diclofenac Sodium (VOLTAREN) 1 % GEL, Apply 2 g topically 4 (four) times daily.   Multiple Vitamins-Minerals (MULTIVITAMIN WITH MINERALS) tablet, Take 1 tablet by mouth daily.   ondansetron (ZOFRAN ODT) 4 MG disintegrating tablet, Take 1 tablet (4 mg total) by mouth every 8 (eight) hours as needed for nausea or vomiting.   tiZANidine (ZANAFLEX) 4 MG tablet, Take 1 tablet (4 mg total) by mouth at bedtime.   Vitamin D, Ergocalciferol, (DRISDOL) 1.25 MG (50000 UNIT) CAPS capsule, Take 1 capsule (50,000 Units total) by mouth every 7 (seven) days.   Reviewed prior external information including notes and imaging from  primary care provider As well as notes that were available from  care everywhere and other healthcare systems.  Past medical history, social, surgical and family history all reviewed in electronic medical record.  No pertanent information unless stated regarding to the chief complaint.   Review of Systems:  No headache, visual changes, nausea, vomiting, diarrhea, constipation, dizziness, abdominal pain, skin rash, fevers, chills, night sweats, weight loss, swollen lymph nodes, body aches, joint swelling, chest pain, shortness of breath, mood changes. POSITIVE muscle aches  Objective  Blood pressure (!) 132/94, pulse 74, height 5' (1.524 m), SpO2 98 %.   General: No apparent distress alert and oriented x3 mood and affect  normal, dressed appropriately.  HEENT: Pupils equal, extraocular movements intact  Respiratory: Patient's speak in full sentences and does not appear short of breath  Cardiovascular: Trace lower extremity edema, non tender, no erythema  The exam today shows crepitus noted.  Patient is tender to palpation diffusely on the knees.  Trace effusion noted on the right knee.  After informed written and verbal consent, patient was seated on exam table. Right knee was prepped with alcohol swab and utilizing anterolateral approach, patient's right knee space was injected with 4:1  marcaine 0.5%: Toradol 30mg /dL. Patient tolerated the procedure well without immediate complications.  After informed written and verbal consent, patient was seated on exam table. Left knee was prepped with alcohol swab and utilizing anterolateral approach, patient's left knee space was injected with 4:1  marcaine 0.5%: Toradol 30mg /dL. Patient tolerated the procedure well without immediate complications.    Impression and Recommendations:     The above documentation has been reviewed and is accurate and complete Lyndal Pulley, DO

## 2022-04-16 ENCOUNTER — Encounter: Payer: Self-pay | Admitting: Family Medicine

## 2022-04-16 ENCOUNTER — Ambulatory Visit (INDEPENDENT_AMBULATORY_CARE_PROVIDER_SITE_OTHER): Payer: Medicaid Other | Admitting: Family Medicine

## 2022-04-16 VITALS — BP 132/94 | HR 74 | Ht 60.0 in

## 2022-04-16 DIAGNOSIS — G8929 Other chronic pain: Secondary | ICD-10-CM

## 2022-04-16 DIAGNOSIS — M170 Bilateral primary osteoarthritis of knee: Secondary | ICD-10-CM

## 2022-04-16 MED ORDER — KETOROLAC TROMETHAMINE 30 MG/ML IJ SOLN
30.0000 mg | Freq: Once | INTRAMUSCULAR | Status: AC
  Administered 2022-04-16: 30 mg via INTRAMUSCULAR

## 2022-04-16 MED ORDER — KETOROLAC TROMETHAMINE 30 MG/ML IJ SOLN: 60.0000 mg | Freq: Once | INTRAMUSCULAR | Status: DC

## 2022-04-16 NOTE — Patient Instructions (Signed)
Good to see you See me in 7-8 weeks

## 2022-04-16 NOTE — Assessment & Plan Note (Signed)
Chronic problem with worsening symptoms.  Patient is attempting to continue to try to lose weight.  Discussed with patient that we do need to get BMI under 40 so she could be a surgical candidate.  Discussed icing regimen and home exercises otherwise.  Topical anti-inflammatories.  Patient wants to avoid other medications if possible.  Follow-up with me again in 6 to 8 weeks.  Could consider going back to the steroid injection if needed.

## 2022-05-27 ENCOUNTER — Ambulatory Visit (INDEPENDENT_AMBULATORY_CARE_PROVIDER_SITE_OTHER): Payer: PRIVATE HEALTH INSURANCE

## 2022-05-27 ENCOUNTER — Ambulatory Visit: Payer: Medicaid Other | Admitting: Sports Medicine

## 2022-05-27 VITALS — BP 138/86 | Ht 60.0 in | Wt 249.0 lb

## 2022-05-27 DIAGNOSIS — M25562 Pain in left knee: Secondary | ICD-10-CM

## 2022-05-27 DIAGNOSIS — M25561 Pain in right knee: Secondary | ICD-10-CM

## 2022-05-27 DIAGNOSIS — G8929 Other chronic pain: Secondary | ICD-10-CM

## 2022-05-27 DIAGNOSIS — M170 Bilateral primary osteoarthritis of knee: Secondary | ICD-10-CM | POA: Diagnosis not present

## 2022-05-27 MED ORDER — MELOXICAM 15 MG PO TABS: 15.0000 mg | ORAL_TABLET | Freq: Every day | ORAL | 0 refills | Status: AC | PRN

## 2022-05-27 NOTE — Progress Notes (Signed)
Lynn Fields D.[facility] [address] [place] [address] Phone: [phone]   Assessment and Plan:     1. Chronic pain of left knee 2. Chronic pain of right knee 3. Primary osteoarthritis of both knees -Chronic with exacerbation, subsequent sports medicine visit - Consistent with flare of bilateral knee osteoarthritis, worse on left today compared to right - No recent imaging of left knee, and with flare of pain, we obtained x-rays and today's visit.  My interpretation: Severe degenerative changes primarily in medial compartment with moderate degenerative changes in patellofemoral compartment. - Recommend using Tylenol for day-to-day pain relief and may use meloxicam as needed for breakthrough pain.  Recommend using NSAIDs not more often than 1-2 times per week - Patient has received benefit from CSI in the past and elects for bilateral CSI today.  Tolerated well per note below -Congratulated patient on and encouraged her to continue her weight loss journey.  Procedure: Knee Joint Injection Side: Bilateral Indication: Flare of osteoarthritis  Risks explained and consent was given verbally. The site was cleaned with alcohol prep. A needle was introduced with an anterio-lateral approach. Injection given using 2mL of 1% lidocaine without epinephrine and 1mL of kenalog 40mg /ml. This was well tolerated and resulted in symptomatic relief.  Needle was removed, hemostasis achieved, and post injection instructions were explained.  Procedure was repeated on contralateral side.  Pt was advised to call or return to clinic if these symptoms worsen or fail to improve as anticipated.    Pertinent previous records reviewed include none   Follow Up: Patient has follow-up visit already scheduled with Dr. Katrinka Blazing in 2 weeks.  May keep this appointment and discuss next steps in treatment.     Subjective:   I, Lynn Fields, am serving as a Neurosurgeon for Doctor  Richardean Sale  Chief Complaint: left knee pain   HPI:   05/27/22 Patient is a 52 year old female complaining of left knee pain. Patient states Severe shooting pain in left leg front and back. Denies hx of blood clots. Denies any injury to the area. Left leg pain with all movement. Has tried icing, heat compresses, OTC medication. Symptoms began yesterday. Pt with bruise to left thigh per report. Baker cyst seen on [place] [id number] mm, pain started Saturday , no blood clot on [place]    Updated 04/16/2022 Lynn Fields is a 52 y.o. female coming in with complaint of B knee pain.  Patient does have end-stage osteoarthritic changes and is attempting to lose weight enough to allow her to have surgical intervention.  6 weeks ago was seen and given steroid injections.  Patient states that her L knee pain has increased more than the R knee. Feels a burning sensation in anterior aspect. Caught her foot on other foot and has more medial pain in the L knee.    Relevant Historical Information:  Hypertension, DM type II, morbid obesity  Additional pertinent review of systems negative.   Current Outpatient Medications:    acetaminophen (TYLENOL) 500 MG tablet, Take 1,000 mg by mouth every 6 (six) hours as needed for headache., Disp: , Rfl:    atorvastatin (LIPITOR) 40 MG tablet, TAKE 1 TABLET BY MOUTH ONCE DAILY AT  6  PM (Patient taking differently: Take 40 mg by mouth daily. TAKE 1 TABLET BY MOUTH ONCE DAILY AT  6  PM), Disp: 90 tablet, Rfl: 1   diclofenac Sodium (VOLTAREN) 1 % GEL, Apply 2 g topically 4 (four)  times daily., Disp: 100 g, Rfl: 0   EPINEPHrine (EPIPEN JR) 0.15 MG/0.3ML injection, Inject 0.6 mLs (0.3 mg total) into the muscle once as needed for up to 1 dose for anaphylaxis., Disp: 2 each, Rfl: 0   hydrochlorothiazide (HYDRODIURIL) 25 MG tablet, Take 1 tablet (25 mg total) by mouth daily., Disp: 90 tablet, Rfl: 1   losartan (COZAAR) 50 MG tablet, Take 1 tablet (50 mg total) by mouth daily., Disp: 90  tablet, Rfl: 1   montelukast (SINGULAIR) 10 MG tablet, Take 1 tablet (10 mg total) by mouth at bedtime., Disp: 90 tablet, Rfl: 1   Multiple Vitamins-Minerals (MULTIVITAMIN WITH MINERALS) tablet, Take 1 tablet by mouth daily., Disp: , Rfl:    ondansetron (ZOFRAN ODT) 4 MG disintegrating tablet, Take 1 tablet (4 mg total) by mouth every 8 (eight) hours as needed for nausea or vomiting., Disp: 20 tablet, Rfl: 0   SUMAtriptan (IMITREX) 50 MG tablet, Take 1 tablet (50 mg total) by mouth once as needed for up to 1 dose for migraine. May repeat in 2 hours if headache persists or recurs., Disp: 10 tablet, Rfl: 0   tiZANidine (ZANAFLEX) 4 MG tablet, Take 1 tablet (4 mg total) by mouth at bedtime., Disp: 30 tablet, Rfl: 0   Vitamin D, Ergocalciferol, (DRISDOL) 1.25 MG (50000 UNIT) CAPS capsule, Take 1 capsule (50,000 Units total) by mouth every 7 (seven) days., Disp: 12 capsule, Rfl: 0   meloxicam (MOBIC) 15 MG tablet, Take 1 tablet (15 mg total) by mouth daily as needed for pain., Disp: 30 tablet, Rfl: 0   Objective:     Vitals:   05/27/22 1437  BP: 138/86  Weight: 249 lb (112.9 kg)  Height: 5' (1.524 m)      Body mass index is 48.63 kg/m.    Physical Exam:    General:  awake, alert oriented, no acute distress nontoxic Skin: no suspicious lesions or rashes Neuro:sensation intact and strength 5/5 with no deficits, no atrophy, normal muscle tone Psych: No signs of anxiety, depression or other mood disorder  Bilateral knee: No gross deformity, though difficult to assess swelling based on body habitus Right ROM Flex 100, Ext 10 LeftROM Flex 80, Ext 10 TTP on left knee however medial femoral condyle, medial joint line, lateral femoral condyle, lateral joint line, posterior fossa NTTP over the quad tendon,  patella, plica, patella tendon, tibial tuberostiy, fibular head,  , pes anserine bursa, gerdy's tubercle,     Gait abnormal, using crutches for assistance and favoring right  leg   Electronically signed by:  Lynn Fields D.[facility] 3:21 PM 05/27/22

## 2022-05-27 NOTE — Patient Instructions (Addendum)
Good to see you  Work note provided out of work remainder of this week can return Monday Tylenol for day to day pain relief  Meloxicam as needed for breakthrough pain

## 2022-06-10 NOTE — Progress Notes (Unsigned)
[name] [address] [place] [address] Phone: [phone] Subjective:   Lynn Fields, am serving as a scribe for Dr. Antoine Primas.  I'm seeing this patient by the request  of:  System, Provider Not In  CC: Bilateral knee pain  [id number]  04/16/2022 Chronic problem with worsening symptoms. Patient is attempting to continue to try to lose weight. Discussed with patient that we do need to get BMI under 40 so she could be a surgical candidate. Discussed icing regimen and home exercises otherwise. Topical anti-inflammatories. Patient wants to avoid other medications if possible. Follow-up with me again in 6 to 8 weeks. Could consider going back to the steroid injection if needed.   Updated 06/11/2022 Lynn Fields is a 52 y.o. female coming in with complaint of bilateral knee pain. Here for knee injection.  Patient has had increasing knee discomfort again.  Describes the pain as a dull, throbbing aching sensation that is stopping her from certain activities.  Patient is in the process of a potential move.       Past Medical History:  Diagnosis Date   Allergy    Arthritis    knees   Depression    Diabetes mellitus without complication (HCC)    Hypertension    Migraines    Ulcer    Past Surgical History:  Procedure Laterality Date   CESAREAN SECTION  1993   COLONOSCOPY WITH PROPOFOL N/A 08/08/2019   Procedure: COLONOSCOPY WITH PROPOFOL;  Surgeon: Tressia Danas, MD;  Location: [facility];  Service: Gastroenterology;  Laterality: N/A;   HYSTEROSCOPY WITH NOVASURE N/A 06/02/2016   Procedure: HYSTEROSCOPY WITH NOVASURE;  Surgeon: Huel Cote, MD;  Location: [facility];  Service: Gynecology;  Laterality: N/A;   TUBAL LIGATION  1993   Social History   Socioeconomic History   Marital status: Married    Spouse name: Not on file   Number of children: Not on file   Years of education: 16   Highest education level: Not on file   Occupational History   Occupation: Magazine features editor: [employer]  Tobacco Use   Smoking status: Never   Smokeless tobacco: Never  Vaping Use   Vaping Use: Never used  Substance and Sexual Activity   Alcohol use: Not Currently    Comment: occ   Drug use: No   Sexual activity: Yes    Birth control/protection: Surgical  Other Topics Concern   Not on file  Social History Narrative   Regular exercise-yes   Caffeine Use-no   Social Determinants of Health   Financial Resource Strain: Not on file  Food Insecurity: Not on file  Transportation Needs: Not on file  Physical Activity: Not on file  Stress: Not on file  Social Connections: Not on file   Allergies  Allergen Reactions   Iodine Anaphylaxis   Other Anaphylaxis    Hair dye   Shellfish Allergy Anaphylaxis    Closes throat   Codeine Itching   Menthol Hives   Morphine And Codeine Itching   Penicillins Swelling    Has patient had a PCN reaction causing immediate rash, facial/tongue/throat swelling, SOB or lightheadedness with hypotension: Yes Has patient had a PCN reaction causing severe rash involving mucus membranes or skin necrosis: No Has patient had a PCN reaction that required hospitalization No Has patient had a PCN reaction occurring within the last 10 years: No If all of the above answers are "NO", then may proceed  with Cephalosporin use.   Sudafed [Pseudoephedrine Hcl] Swelling   Azithromycin Rash   Erythromycin Rash   Family History  Problem Relation Age of Onset   Diabetes Mother    Hypertension Mother    Heart disease Mother    Hyperlipidemia Mother    Diabetes Father    Hypertension Father    Heart disease Father    Hyperlipidemia Father    Crohn's disease Father    Cervical cancer Sister    Heart disease Sister    Diabetes Sister    Heart disease Sister    Diabetes Sister    Colon polyps Neg Hx    Esophageal cancer Neg Hx    Stomach cancer Neg Hx    Rectal cancer Neg Hx       Current Outpatient Medications (Cardiovascular):    atorvastatin (LIPITOR) 40 MG tablet, TAKE 1 TABLET BY MOUTH ONCE DAILY AT  6  PM (Patient taking differently: Take 40 mg by mouth daily. TAKE 1 TABLET BY MOUTH ONCE DAILY AT  6  PM)   EPINEPHrine (EPIPEN JR) 0.15 MG/0.3ML injection, Inject 0.6 mLs (0.3 mg total) into the muscle once as needed for up to 1 dose for anaphylaxis.   hydrochlorothiazide (HYDRODIURIL) 25 MG tablet, Take 1 tablet (25 mg total) by mouth daily.   losartan (COZAAR) 50 MG tablet, Take 1 tablet (50 mg total) by mouth daily.  Current Outpatient Medications (Respiratory):    montelukast (SINGULAIR) 10 MG tablet, Take 1 tablet (10 mg total) by mouth at bedtime.  Current Outpatient Medications (Analgesics):    acetaminophen (TYLENOL) 500 MG tablet, Take 1,000 mg by mouth every 6 (six) hours as needed for headache.   meloxicam (MOBIC) 15 MG tablet, Take 1 tablet (15 mg total) by mouth daily as needed for pain.   SUMAtriptan (IMITREX) 50 MG tablet, Take 1 tablet (50 mg total) by mouth once as needed for up to 1 dose for migraine. May repeat in 2 hours if headache persists or recurs.   Current Outpatient Medications (Other):    diclofenac Sodium (VOLTAREN) 1 % GEL, Apply 2 g topically 4 (four) times daily.   Multiple Vitamins-Minerals (MULTIVITAMIN WITH MINERALS) tablet, Take 1 tablet by mouth daily.   ondansetron (ZOFRAN ODT) 4 MG disintegrating tablet, Take 1 tablet (4 mg total) by mouth every 8 (eight) hours as needed for nausea or vomiting.   tiZANidine (ZANAFLEX) 4 MG tablet, Take 1 tablet (4 mg total) by mouth at bedtime.   Vitamin D, Ergocalciferol, (DRISDOL) 1.25 MG (50000 UNIT) CAPS capsule, Take 1 capsule (50,000 Units total) by mouth every 7 (seven) days.   Reviewed prior external information including notes and imaging from  primary care provider As well as notes that were available from care everywhere and other healthcare systems.  Past medical history,  social, surgical and family history all reviewed in electronic medical record.  No pertanent information unless stated regarding to the chief complaint.   Review of Systems:  No headache, visual changes, nausea, vomiting, diarrhea, constipation, dizziness, abdominal pain, skin rash, fevers, chills, night sweats, weight loss, swollen lymph nodes, body aches, joint swelling, chest pain, shortness of breath, mood changes. POSITIVE muscle aches  Objective  Blood pressure (!) 130/90, height 5' (1.524 m), weight 243 lb (110.2 kg).   General: No apparent distress alert and oriented x3 mood and affect normal, dressed appropriately.  HEENT: Pupils equal, extraocular movements intact  Respiratory: Patient's speak in full sentences and does not appear short of breath  Cardiovascular: No lower extremity edema, non tender, no erythema  Antalgic gait noted.  Patient does have crepitus noted.  Some mild instability noted with valgus and varus force.  Patient does have trace effusion noted bilaterally as well.  After informed written and verbal consent, patient was seated on exam table. Right knee was prepped with alcohol swab and utilizing anterolateral approach, patient's right knee space was injected with 4:1  marcaine 0.5%: Kenalog 40mg /dL. Patient tolerated the procedure well without immediate complications.  After informed written and verbal consent, patient was seated on exam table. Left knee was prepped with alcohol swab and utilizing anterolateral approach, patient's left knee space was injected with 4:1  marcaine 0.5%: Kenalog 40mg /dL. Patient tolerated the procedure well without immediate complications.    Impression and Recommendations:    The above documentation has been reviewed and is accurate and complete Judi Saa, DO

## 2022-06-11 ENCOUNTER — Ambulatory Visit: Payer: Medicaid Other | Admitting: Family Medicine

## 2022-06-11 ENCOUNTER — Encounter: Payer: Self-pay | Admitting: Family Medicine

## 2022-06-11 VITALS — BP 130/90 | Ht 60.0 in | Wt 243.0 lb

## 2022-06-11 DIAGNOSIS — M170 Bilateral primary osteoarthritis of knee: Secondary | ICD-10-CM | POA: Diagnosis not present

## 2022-06-11 NOTE — Patient Instructions (Signed)
Good to see you! Congratulations See you again in 10 weeks

## 2022-06-11 NOTE — Assessment & Plan Note (Signed)
Chronic problem with worsening symptoms.  Secondary to comorbidities as well as social determinant of health and be physically and active patient is having difficulty with the weight loss.  Still needs to get BMI under 40 to consider the potential for knee replacement.  Discussed icing regimen and home exercises.  Discussed which activities to do and which ones to avoid.  Follow-up again in 6 to 8 weeks

## 2022-08-14 NOTE — Progress Notes (Unsigned)
[facility] [address] [place] [address] Phone: [phone] Subjective:   Lynn Fields, am serving as a scribe for Dr. Antoine Primas.  I'm seeing this patient by the request  of:  System, Provider Not In  CC: bilateral  knee pain  [id number]  06/11/2022 Chronic problem with worsening symptoms.  Secondary to comorbidities as well as social determinant of health and be physically and active patient is having difficulty with the weight loss.  Still needs to get BMI under 40 to consider the potential for knee replacement.  Discussed icing regimen and home exercises.  Discussed which activities to do and which ones to avoid.  Follow-up again in 6 to 8 weeks      Update 08/19/2022 Lynn Fields is a 52 y.o. female coming in with complaint of B knee pain. 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 Wants injections. Pain just started coming back about last week.      Past Medical History:  Diagnosis Date   Allergy    Arthritis    knees   Depression    Diabetes mellitus without complication (HCC)    Hypertension    Migraines    Ulcer    Past Surgical History:  Procedure Laterality Date   CESAREAN SECTION  1993   COLONOSCOPY WITH PROPOFOL N/A 08/08/2019   Procedure: COLONOSCOPY WITH PROPOFOL;  Surgeon: Lynn Danas, MD;  Location: [facility];  Service: Gastroenterology;  Laterality: N/A;   HYSTEROSCOPY WITH NOVASURE N/A 06/02/2016   Procedure: HYSTEROSCOPY WITH NOVASURE;  Surgeon: Lynn Cote, MD;  Location: [facility];  Service: Gynecology;  Laterality: N/A;   TUBAL LIGATION  1993   Social History   Socioeconomic History   Marital status: Divorced    Spouse name: Not on file   Number of children: Not on file   Years of education: 16   Highest education level: Not on file  Occupational History   Occupation: Magazine features editor: [employer]  Tobacco Use   Smoking status: Never   Smokeless tobacco: Never   Vaping Use   Vaping status: Never Used  Substance and Sexual Activity   Alcohol use: Not Currently    Comment: occ   Drug use: No   Sexual activity: Yes    Birth control/protection: Surgical  Other Topics Concern   Not on file  Social History Narrative   Regular exercise-yes   Caffeine Use-no   Social Determinants of Health   Financial Resource Strain: Not on file  Food Insecurity: No Food Insecurity (10/28/2021)   [facility]   Hunger Vital Sign    Worried About Running Out of Food in the Last Year: Never true    Ran Out of Food in the Last Year: Never true  Transportation Needs: No Transportation Needs (10/28/2021)   [facility]   PRAPARE - Transportation    Lack of Transportation (Medical): No    Lack of Transportation (Non-Medical): No  Physical Activity: Unknown (03/11/2021)   [facility], [facility]   Exercise Vital Sign    Days of Exercise per Week: 0 days    Minutes of Exercise per Session: Not on file  Stress: Not on file  Social Connections: Unknown (05/28/2021)   Received from [facility], [facility]   Social Network  Social Network: Not on file   Allergies  Allergen Reactions   Iodine Anaphylaxis   Other Anaphylaxis    Hair dye   Shellfish Allergy Anaphylaxis    Closes throat   Codeine Itching   Menthol Hives   Morphine And Codeine Itching   Penicillins Swelling    Has patient had a PCN reaction causing immediate rash, facial/tongue/throat swelling, SOB or lightheadedness with hypotension: Yes Has patient had a PCN reaction causing severe rash involving mucus membranes or skin necrosis: No Has patient had a PCN reaction that required hospitalization No Has patient had a PCN reaction occurring within the last 10 years: No If all of the above answers  are "NO", then may proceed with Cephalosporin use.   Sudafed [Pseudoephedrine Hcl] Swelling   Azithromycin Rash   Erythromycin Rash   Family History  Problem Relation Age of Onset   Diabetes Mother    Hypertension Mother    Heart disease Mother    Hyperlipidemia Mother    Diabetes Father    Hypertension Father    Heart disease Father    Hyperlipidemia Father    Crohn's disease Father    Cervical cancer Sister    Heart disease Sister    Diabetes Sister    Heart disease Sister    Diabetes Sister    Colon polyps Neg Hx    Esophageal cancer Neg Hx    Stomach cancer Neg Hx    Rectal cancer Neg Hx      Current Outpatient Medications (Cardiovascular):    atorvastatin (LIPITOR) 40 MG tablet, TAKE 1 TABLET BY MOUTH ONCE DAILY AT  6  PM (Patient taking differently: Take 40 mg by mouth daily. TAKE 1 TABLET BY MOUTH ONCE DAILY AT  6  PM)   EPINEPHrine (EPIPEN JR) 0.15 MG/0.3ML injection, Inject 0.6 mLs (0.3 mg total) into the muscle once as needed for up to 1 dose for anaphylaxis.   hydrochlorothiazide (HYDRODIURIL) 25 MG tablet, Take 1 tablet (25 mg total) by mouth daily.   losartan (COZAAR) 50 MG tablet, Take 1 tablet (50 mg total) by mouth daily.  Current Outpatient Medications (Respiratory):    montelukast (SINGULAIR) 10 MG tablet, Take 1 tablet (10 mg total) by mouth at bedtime.  Current Outpatient Medications (Analgesics):    acetaminophen (TYLENOL) 500 MG tablet, Take 1,000 mg by mouth every 6 (six) hours as needed for headache.   meloxicam (MOBIC) 15 MG tablet, Take 1 tablet (15 mg total) by mouth daily as needed for pain.   SUMAtriptan (IMITREX) 50 MG tablet, Take 1 tablet (50 mg total) by mouth once as needed for up to 1 dose for migraine. May repeat in 2 hours if headache persists or recurs.   Current Outpatient Medications (Other):    diclofenac Sodium (VOLTAREN) 1 % GEL, Apply 2 g topically 4 (four) times daily.   Multiple Vitamins-Minerals (MULTIVITAMIN WITH MINERALS)  tablet, Take 1 tablet by mouth daily.   ondansetron (ZOFRAN ODT) 4 MG disintegrating tablet, Take 1 tablet (4 mg total) by mouth every 8 (eight) hours as needed for nausea or vomiting.   tiZANidine (ZANAFLEX) 4 MG tablet, Take 1 tablet (4 mg total) by mouth at bedtime.   Vitamin D, Ergocalciferol, (DRISDOL) 1.25 MG (50000 UNIT) CAPS capsule, Take 1 capsule (50,000 Units total) by mouth every 7 (seven) days.   Reviewed prior external information including notes and imaging from  primary care provider As well as notes that were available from care everywhere and other healthcare  systems.  Past medical history, social, surgical and family history all reviewed in electronic medical record.  No pertanent information unless stated regarding to the chief complaint.   Review of Systems:  No headache, visual changes, nausea, vomiting, diarrhea, constipation, dizziness, abdominal pain, skin rash, fevers, chills, night sweats, weight loss, swollen lymph nodes, body aches, joint swelling, chest pain, shortness of breath, mood changes. POSITIVE muscle aches  Objective  Blood pressure 124/86, pulse 67, height 5' (1.524 m), weight 254 lb (115.2 kg), SpO2 94%.   General: No apparent distress alert and oriented x3 mood and affect normal, dressed appropriately.  HEENT: Pupils equal, extraocular movements intact  Respiratory: Patient's speak in full sentences and does not appear short of breath  Cardiovascular: No lower extremity edema, non tender, no erythema  Bilateral kneesshow arthritic changes noted.  Does have some swelling noted of the left knee has been improving.  Tenderness to palpation in this area.  Crepitus noted  After informed written and verbal consent, patient was seated on exam table. Right knee was prepped with alcohol swab and utilizing anterolateral approach, patient's right knee space was injected with 4:1  marcaine 0.5%: Kenalog 40mg /dL. Patient tolerated the procedure well without  immediate complications.  After informed written and verbal consent, patient was seated on exam table. Left knee was prepped with alcohol swab and utilizing anterolateral approach, patient's left knee space was injected with 4:1  marcaine 0.5%: Kenalog 40mg /dL. Patient tolerated the procedure well without immediate complications.  She     Impression and Recommendations:     The above documentation has been reviewed and is accurate and complete Judi Saa, DO

## 2022-08-19 ENCOUNTER — Encounter: Payer: Self-pay | Admitting: Family Medicine

## 2022-08-19 ENCOUNTER — Ambulatory Visit: Payer: PRIVATE HEALTH INSURANCE | Admitting: Family Medicine

## 2022-08-19 VITALS — BP 124/86 | HR 67 | Ht 60.0 in | Wt 254.0 lb

## 2022-08-19 DIAGNOSIS — M170 Bilateral primary osteoarthritis of knee: Secondary | ICD-10-CM

## 2022-08-19 NOTE — Patient Instructions (Signed)
Good to see you! Congrats See you again in 10 weeks

## 2022-08-19 NOTE — Assessment & Plan Note (Addendum)
Discussed which it is to do and which the stability.  Chronic problem with exacerbation.Bilateral injections.  Discussed icing regimen and home exercises.  Social determinants of health including patient's weight as well as not being able to do significant physical activity because of this.  Follow-up again in 10 to 12 weeks

## 2022-10-24 NOTE — Progress Notes (Deleted)
[facility] [address] [place] [address] Phone: [phone] Subjective:    I'm seeing this patient by the request  of:  System, Provider Not In  CC:   [id number]  08/19/2022 Discussed which it is to do and which the stability. Chronic problem with exacerbation.Bilateral injections. Discussed icing regimen and home exercises. Social determinants of health including patient's weight as well as not being able to do significant physical activity because of this. Follow-up again in 10 to 12 weeks   Update 10/28/2022 Lynn Fields is a 52 y.o. female coming in with complaint of B knee pain. Patient states       Past Medical History:  Diagnosis Date   Allergy    Arthritis    knees   Depression    Diabetes mellitus without complication (HCC)    Hypertension    Migraines    Ulcer    Past Surgical History:  Procedure Laterality Date   CESAREAN SECTION  1993   COLONOSCOPY WITH PROPOFOL N/A 08/08/2019   Procedure: COLONOSCOPY WITH PROPOFOL;  Surgeon: Tressia Danas, MD;  Location: [facility];  Service: Gastroenterology;  Laterality: N/A;   HYSTEROSCOPY WITH NOVASURE N/A 06/02/2016   Procedure: HYSTEROSCOPY WITH NOVASURE;  Surgeon: Huel Cote, MD;  Location: [facility];  Service: Gynecology;  Laterality: N/A;   TUBAL LIGATION  1993   Social History   Socioeconomic History   Marital status: Divorced    Spouse name: Not on file   Number of children: Not on file   Years of education: 16   Highest education level: Not on file  Occupational History   Occupation: Magazine features editor: [employer]  Tobacco Use   Smoking status: Never   Smokeless tobacco: Never  Vaping Use   Vaping status: Never Used  Substance and Sexual Activity   Alcohol use: Not Currently    Comment: occ   Drug use: No   Sexual activity: Yes    Birth control/protection: Surgical  Other Topics Concern   Not on file  Social History Narrative    Regular exercise-yes   Caffeine Use-no   Social Determinants of Health   Financial Resource Strain: Not on file  Food Insecurity: No Food Insecurity (10/28/2021)   [facility] visits prior to [facility]/29/2022[facility]   Hunger Vital Sign    Worried About Running Out of Food in the Last Year: Never true    Ran Out of Food in the Last Year: Never true  Transportation Needs: No Transportation Needs (10/28/2021)   [facility] to [facility]29[facility] visits prior to 03/29/2022.   PRAPARE - Administrator, Civil Service (Medical): No    Lack of Transportation (Non-Medical): No  Physical Activity: Unknown (03/11/2021)   [facility], [facility]   Exercise Vital Sign    Days of Exercise per Week: 0 days    Minutes of Exercise per Session: Not on file  Stress: Not on file  Social Connections: Unknown (05/28/2021)   [facility], [facility]   Social Network    Social Network: Not on file   Allergies  Allergen Reactions   Iodine Anaphylaxis   Other Anaphylaxis    Hair dye   Shellfish Allergy Anaphylaxis    Closes throat  Codeine Itching   Menthol Hives   Morphine And Codeine Itching   Penicillins Swelling    Has patient had a PCN reaction causing immediate rash, facial/tongue/throat swelling, SOB or lightheadedness with hypotension: Yes Has patient had a PCN reaction causing severe rash involving mucus membranes or skin necrosis: No Has patient had a PCN reaction that required hospitalization No Has patient had a PCN reaction occurring within the last 10 years: No If all of the above answers are "NO", then may proceed with Cephalosporin use.   Sudafed [Pseudoephedrine Hcl] Swelling   Azithromycin Rash   Erythromycin Rash   Family  History  Problem Relation Age of Onset   Diabetes Mother    Hypertension Mother    Heart disease Mother    Hyperlipidemia Mother    Diabetes Father    Hypertension Father    Heart disease Father    Hyperlipidemia Father    Crohn's disease Father    Cervical cancer Sister    Heart disease Sister    Diabetes Sister    Heart disease Sister    Diabetes Sister    Colon polyps Neg Hx    Esophageal cancer Neg Hx    Stomach cancer Neg Hx    Rectal cancer Neg Hx      Current Outpatient Medications (Cardiovascular):    atorvastatin (LIPITOR) 40 MG tablet, TAKE 1 TABLET BY MOUTH ONCE DAILY AT  6  PM (Patient taking differently: Take 40 mg by mouth daily. TAKE 1 TABLET BY MOUTH ONCE DAILY AT  6  PM)   EPINEPHrine (EPIPEN JR) 0.15 MG/0.3ML injection, Inject 0.6 mLs (0.3 mg total) into the muscle once as needed for up to 1 dose for anaphylaxis.   hydrochlorothiazide (HYDRODIURIL) 25 MG tablet, Take 1 tablet (25 mg total) by mouth daily.   losartan (COZAAR) 50 MG tablet, Take 1 tablet (50 mg total) by mouth daily.  Current Outpatient Medications (Respiratory):    montelukast (SINGULAIR) 10 MG tablet, Take 1 tablet (10 mg total) by mouth at bedtime.  Current Outpatient Medications (Analgesics):    acetaminophen (TYLENOL) 500 MG tablet, Take 1,000 mg by mouth every 6 (six) hours as needed for headache.   meloxicam (MOBIC) 15 MG tablet, Take 1 tablet (15 mg total) by mouth daily as needed for pain.   SUMAtriptan (IMITREX) 50 MG tablet, Take 1 tablet (50 mg total) by mouth once as needed for up to 1 dose for migraine. May repeat in 2 hours if headache persists or recurs.   Current Outpatient Medications (Other):    diclofenac Sodium (VOLTAREN) 1 % GEL, Apply 2 g topically 4 (four) times daily.   Multiple Vitamins-Minerals (MULTIVITAMIN WITH MINERALS) tablet, Take 1 tablet by mouth daily.   ondansetron (ZOFRAN ODT) 4 MG disintegrating tablet, Take 1 tablet (4 mg total) by mouth every 8 (eight)  hours as needed for nausea or vomiting.   tiZANidine (ZANAFLEX) 4 MG tablet, Take 1 tablet (4 mg total) by mouth at bedtime.   Vitamin D, Ergocalciferol, (DRISDOL) 1.25 MG (50000 UNIT) CAPS capsule, Take 1 capsule (50,000 Units total) by mouth every 7 (seven) days.   Reviewed prior external information including notes and imaging from  primary care provider As well as notes that were available from care everywhere and other healthcare systems.  Past medical history, social, surgical and family history all reviewed in electronic medical record.  No pertanent information unless stated regarding to the chief complaint.   Review of Systems:  No headache, visual  changes, nausea, vomiting, diarrhea, constipation, dizziness, abdominal pain, skin rash, fevers, chills, night sweats, weight loss, swollen lymph nodes, body aches, joint swelling, chest pain, shortness of breath, mood changes. POSITIVE muscle aches  Objective  There were no vitals taken for this visit.   General: No apparent distress alert and oriented x3 mood and affect normal, dressed appropriately.  HEENT: Pupils equal, extraocular movements intact  Respiratory: Patient's speak in full sentences and does not appear short of breath  Cardiovascular: No lower extremity edema, non tender, no erythema      Impression and Recommendations:

## 2022-10-28 ENCOUNTER — Ambulatory Visit: Payer: PRIVATE HEALTH INSURANCE | Admitting: Family Medicine

## 2023-04-06 ENCOUNTER — Ambulatory Visit: Admitting: Sports Medicine

## 2023-04-06 VITALS — HR 93 | Ht 60.0 in | Wt 254.0 lb

## 2023-04-06 DIAGNOSIS — M170 Bilateral primary osteoarthritis of knee: Secondary | ICD-10-CM | POA: Diagnosis not present

## 2023-04-06 DIAGNOSIS — M25561 Pain in right knee: Secondary | ICD-10-CM | POA: Diagnosis not present

## 2023-04-06 DIAGNOSIS — G8929 Other chronic pain: Secondary | ICD-10-CM | POA: Diagnosis not present

## 2023-04-06 DIAGNOSIS — M25562 Pain in left knee: Secondary | ICD-10-CM | POA: Diagnosis not present

## 2023-04-06 NOTE — Progress Notes (Signed)
 Lynn Fields D.[facility] [address] [place] [address] Phone: [phone]   Assessment and Plan:     1. Primary osteoarthritis of both knees 2. Chronic pain of left knee 3. Chronic pain of right knee  -Chronic with exacerbation, subsequent visit - Consistent with recurrent flare of severe bilateral knee osteoarthritis - Patient had significant relief from bilateral intra-articular CSI performed on 08/19/2022 with pain significantly returning over the past 3 weeks.  Patient elects for repeat bilateral intra-articular CSI.  Tolerated well per note below -Continue Tylenol 500 to 1000 mg tablets 2-3 times a day for day-to-day pain relief  Procedure: Knee Joint Injection Side: Bilateral Indication: Flare of osteoarthritis  Risks explained and consent was given verbally. The site was cleaned with alcohol prep. A needle was introduced with an anterio-lateral approach. Injection given using 2mL of 1% lidocaine without epinephrine and 1mL of kenalog 40mg /ml. This was well tolerated and resulted in symptomatic relief.  Needle was removed, hemostasis achieved, and post injection instructions were explained.  Procedure was repeated on contralateral side.  Pt was advised to call or return to clinic if these symptoms worsen or fail to improve as anticipated.   Pertinent previous records reviewed include none  Follow Up: As needed for evaluation.  Could consider repeat CSI if patient receives at least 3 months relief.  Could discuss alternative injections such as Zilretta or HA   Subjective:   I, Lynn Fields, am serving as a Neurosurgeon for [employer]  Chief Complaint: bilat knee pain   HPI:  06/11/2022 Chronic problem with worsening symptoms.  Secondary to comorbidities as well as social determinant of health and be physically and active patient is having difficulty with the weight loss.  Still needs to get BMI under 40 to consider the  potential for knee replacement.  Discussed icing regimen and home exercises.  Discussed which activities to do and which ones to avoid.  Follow-up again in 6 to 8 weeks      Update 08/19/2022 Lynn Fields is a 53 y.o. female coming in with complaint of B knee pain. 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 Wants injections. Pain just started coming back about last week.   04/06/23 Patient states that she is in pain flare. She has been in flare for awhile now    Relevant Historical Information: Hypertension, DM type II, morbid obesity  Additional pertinent review of systems negative.   Current Outpatient Medications:    acetaminophen (TYLENOL) 500 MG tablet, Take 1,000 mg by mouth every 6 (six) hours as needed for headache., Disp: , Rfl:    atorvastatin (LIPITOR) 40 MG tablet, TAKE 1 TABLET BY MOUTH ONCE DAILY AT  6  PM (Patient taking differently: Take 40 mg by mouth daily. TAKE 1 TABLET BY MOUTH ONCE DAILY AT  6  PM), Disp: 90 tablet, Rfl: 1   diclofenac Sodium (VOLTAREN) 1 % GEL, Apply 2 g topically 4 (four) times daily., Disp: 100 g, Rfl: 0   EPINEPHrine (EPIPEN JR) 0.15 MG/0.3ML injection, Inject 0.6 mLs (0.3 mg total) into the muscle once as needed for up to 1 dose for anaphylaxis., Disp: 2 each, Rfl: 0   hydrochlorothiazide (HYDRODIURIL) 25 MG tablet, Take 1 tablet (25 mg total) by mouth daily., Disp: 90 tablet, Rfl: 1   losartan (COZAAR) 50 MG tablet, Take 1 tablet (50 mg total) by mouth daily., Disp: 90 tablet, Rfl: 1   meloxicam (MOBIC) 15 MG tablet, Take  1 tablet (15 mg total) by mouth daily as needed for pain., Disp: 30 tablet, Rfl: 0   montelukast (SINGULAIR) 10 MG tablet, Take 1 tablet (10 mg total) by mouth at bedtime., Disp: 90 tablet, Rfl: 1   Multiple Vitamins-Minerals (MULTIVITAMIN WITH MINERALS) tablet, Take 1 tablet by mouth daily., Disp: , Rfl:    ondansetron (ZOFRAN ODT) 4 MG disintegrating tablet, Take 1 tablet (4 mg total) by mouth every 8 (eight) hours as  needed for nausea or vomiting., Disp: 20 tablet, Rfl: 0   SUMAtriptan (IMITREX) 50 MG tablet, Take 1 tablet (50 mg total) by mouth once as needed for up to 1 dose for migraine. May repeat in 2 hours if headache persists or recurs., Disp: 10 tablet, Rfl: 0   tiZANidine (ZANAFLEX) 4 MG tablet, Take 1 tablet (4 mg total) by mouth at bedtime., Disp: 30 tablet, Rfl: 0   Vitamin D, Ergocalciferol, (DRISDOL) 1.25 MG (50000 UNIT) CAPS capsule, Take 1 capsule (50,000 Units total) by mouth every 7 (seven) days., Disp: 12 capsule, Rfl: 0   Objective:     Vitals:   04/06/23 1502  Pulse: 93  SpO2: 94%  Weight: 254 lb (115.2 kg)  Height: 5' (1.524 m)      Body mass index is 49.61 kg/m.    Physical Exam:    General:  awake, alert oriented, no acute distress nontoxic Skin: no suspicious lesions or rashes Neuro:sensation intact and strength 5/5 with no deficits, no atrophy, normal muscle tone Psych: No signs of anxiety, depression or other mood disorder   Bilateral knee: No gross deformity, though difficult to assess swelling based on body habitus Right ROM Flex 80, Ext 10 LeftROM Flex 80, Ext 10 TTP on right knee medial femoral condyle, medial joint line, lateral femoral condyle, lateral joint line, posterior fossa NTTP over the quad tendon,  patella, plica, patella tendon, tibial tuberostiy, fibular head,  , pes anserine bursa, gerdy's tubercle,      Gait abnormal, using single crutch for assistance, favoring left leg    Electronically signed by:  Lynn Fields D.[facility] 3:16 PM 04/06/23

## 2023-05-12 ENCOUNTER — Emergency Department (HOSPITAL_COMMUNITY)

## 2023-05-12 ENCOUNTER — Emergency Department (HOSPITAL_COMMUNITY)
Admission: EM | Admit: 2023-05-12 | Discharge: 2023-05-12 | Disposition: A | Discharge status: Home / Self Care | Attending: Emergency Medicine | Admitting: Emergency Medicine

## 2023-05-12 ENCOUNTER — Other Ambulatory Visit: Payer: Self-pay

## 2023-05-12 ENCOUNTER — Encounter (HOSPITAL_COMMUNITY): Payer: Self-pay

## 2023-05-12 DIAGNOSIS — I10 Essential (primary) hypertension: Secondary | ICD-10-CM | POA: Diagnosis not present

## 2023-05-12 DIAGNOSIS — E119 Type 2 diabetes mellitus without complications: Secondary | ICD-10-CM | POA: Diagnosis not present

## 2023-05-12 DIAGNOSIS — Z79899 Other long term (current) drug therapy: Secondary | ICD-10-CM | POA: Insufficient documentation

## 2023-05-12 DIAGNOSIS — R079 Chest pain, unspecified: Secondary | ICD-10-CM | POA: Insufficient documentation

## 2023-05-12 DIAGNOSIS — Z7984 Long term (current) use of oral hypoglycemic drugs: Secondary | ICD-10-CM | POA: Insufficient documentation

## 2023-05-12 LAB — CBC
HCT: 35.9 % — ABNORMAL LOW (ref 36.0–46.0)
Hemoglobin: 12.1 g/dL (ref 12.0–15.0)
MCH: 28.4 pg (ref 26.0–34.0)
MCHC: 33.7 g/dL (ref 30.0–36.0)
MCV: 84.3 fL (ref 80.0–100.0)
Platelets: 317 10*3/uL (ref 150–400)
RBC: 4.26 MIL/uL (ref 3.87–5.11)
RDW: 13.4 % (ref 11.5–15.5)
WBC: 7.8 10*3/uL (ref 4.0–10.5)
nRBC: 0 % (ref 0.0–0.2)

## 2023-05-12 LAB — BASIC METABOLIC PANEL WITH GFR
Anion gap: 14 (ref 5–15)
BUN: 24 mg/dL — ABNORMAL HIGH (ref 6–20)
CO2: 25 mmol/L (ref 22–32)
Calcium: 9.1 mg/dL (ref 8.9–10.3)
Chloride: 99 mmol/L (ref 98–111)
Creatinine, Ser: 1.49 mg/dL — ABNORMAL HIGH (ref 0.44–1.00)
GFR, Estimated: 43 mL/min — ABNORMAL LOW (ref >60)
Glucose, Bld: 107 mg/dL — ABNORMAL HIGH (ref 70–99)
Potassium: 3.3 mmol/L — ABNORMAL LOW (ref 3.5–5.1)
Sodium: 138 mmol/L (ref 135–145)

## 2023-05-12 LAB — TROPONIN I (HIGH SENSITIVITY)
Troponin I (High Sensitivity): 3 ng/L (ref <18)
Troponin I (High Sensitivity): 3 ng/L (ref <18)

## 2023-05-12 LAB — D-DIMER, QUANTITATIVE: D-Dimer, Quant: 0.33 ug{FEU}/mL (ref 0.00–0.50)

## 2023-05-12 LAB — CBG MONITORING, ED: Glucose-Capillary: 101 mg/dL — ABNORMAL HIGH (ref 70–99)

## 2023-05-12 LAB — HCG, SERUM, QUALITATIVE: Preg, Serum: NEGATIVE

## 2023-05-12 MED ORDER — SODIUM CHLORIDE 0.9 % IV BOLUS
500.0000 mL | Freq: Once | INTRAVENOUS | Status: AC
  Administered 2023-05-12: 500 mL via INTRAVENOUS

## 2023-05-12 MED ORDER — ONDANSETRON HCL 4 MG/2ML IJ SOLN
4.0000 mg | Freq: Once | INTRAMUSCULAR | Status: AC
  Administered 2023-05-12: 4 mg via INTRAVENOUS
  Filled 2023-05-12: qty 2

## 2023-05-12 MED ORDER — PANTOPRAZOLE SODIUM 40 MG PO TBEC: 40.0000 mg | DELAYED_RELEASE_TABLET | Freq: Every day | ORAL | 0 refills | Status: AC

## 2023-05-12 NOTE — Discharge Instructions (Signed)
 You can continue the Voltaren gel for pain.  You can apply some topical to the anterior chest wall.  Take the antacid medications as prescribed.  Return to the ER for recurrent or worsening symptoms.  Follow-up with cardiology for further evaluation as we discussed

## 2023-05-12 NOTE — ED Triage Notes (Signed)
 Pt reports with left sided chest pain that goes into her left shoulder, weakness, shob, and not feeling well since yesterday. Pt reports being complaint with all medications.

## 2023-05-12 NOTE — ED Provider Notes (Signed)
 [place] EMERGENCY DEPARTMENT AT [facility] Provider Note   CSN: [id number] Arrival date & time: 05/12/23  0801     History  Chief Complaint  Patient presents with   Chest Pain    Lynn Fields is a 53 y.o. female.   Chest Pain    Patient has a history of diabetes hypertension migraines depressions arthritis.  Patient states there is a family history of heart disease but she does not have a history of heart disease that she is aware of.  She started having trouble with complaints of chest pain feeling weak low blood pressure starting yesterday.  Patient states that started when she was at work.  She was feeling nauseated and lightheaded.  At home she noticed her blood pressure was low in the 70s and 80s.  Patient tried to drink some salty fluids and prayed on it overnight.  Patient states she continues to have pain in her chest.  She is feeling short of breath.  She continues to feel weak.  She has been taking all of her medications and has not had any recent changes.  Home Medications Prior to Admission medications   Medication Sig Start Date End Date Taking? Authorizing Provider  pantoprazole (PROTONIX) 40 MG tablet Take 1 tablet (40 mg total) by mouth daily. 05/12/23  Yes Linwood Dibbles, MD  acetaminophen (TYLENOL) 500 MG tablet Take 1,000 mg by mouth every 6 (six) hours as needed for headache.    [provider]  atorvastatin (LIPITOR) 40 MG tablet TAKE 1 TABLET BY MOUTH ONCE DAILY AT  6  PM Patient taking differently: Take 40 mg by mouth daily. TAKE 1 TABLET BY MOUTH ONCE DAILY AT  6  PM 04/28/19   Philip Aspen, Limmie Patricia, MD  Cholecalciferol 1.25 MG (50000 UT) capsule Take 50,000 Units by mouth once a week.    [provider]  cyclobenzaprine (FLEXERIL) 5 MG tablet Take 5 mg by mouth 3 (three) times daily as needed for muscle spasms. 04/10/23   [provider]  diclofenac Sodium (VOLTAREN) 1 % GEL Apply 2 g topically 4 (four) times daily.  05/28/21   Judi Saa, DO  EPINEPHrine (EPIPEN JR) 0.15 MG/0.3ML injection Inject 0.6 mLs (0.3 mg total) into the muscle once as needed for up to 1 dose for anaphylaxis. 10/26/18   Philip Aspen, Limmie Patricia, MD  glipiZIDE (GLUCOTROL XL) 5 MG 24 hr tablet Take 1 tablet by mouth daily. 03/11/23 03/10/24  [provider]  hydrochlorothiazide (HYDRODIURIL) 25 MG tablet Take 1 tablet (25 mg total) by mouth daily. 04/28/19   Philip Aspen, Limmie Patricia, MD  losartan (COZAAR) 50 MG tablet Take 1 tablet (50 mg total) by mouth daily. 04/28/19   Philip Aspen, Limmie Patricia, MD  meloxicam (MOBIC) 15 MG tablet Take 1 tablet (15 mg total) by mouth daily as needed for pain. 05/27/22   Richardean Sale, DO  metFORMIN (GLUCOPHAGE-XR) 500 MG 24 hr tablet Take 1,000 mg by mouth 2 (two) times daily. 03/11/23 03/10/24  [provider]  montelukast (SINGULAIR) 10 MG tablet Take 1 tablet (10 mg total) by mouth at bedtime. 04/28/19   Philip Aspen, Limmie Patricia, MD  Multiple Vitamins-Minerals (MULTIVITAMIN WITH MINERALS) tablet Take 1 tablet by mouth daily.    [provider]  ondansetron (ZOFRAN ODT) 4 MG disintegrating tablet Take 1 tablet (4 mg total) by mouth every 8 (eight) hours as needed for nausea or vomiting. 04/28/19   Henderson Cloud, MD  potassium chloride SA (KLOR-CON M) 20 MEQ tablet Take 1 tablet by mouth daily. 10/05/21   [provider]  SUMAtriptan (IMITREX) 50 MG tablet Take 1 tablet (50 mg total) by mouth once as needed for up to 1 dose for migraine. May repeat in 2 hours if headache persists or recurs. 04/28/19   Philip Aspen, Limmie Patricia, MD  tiZANidine (ZANAFLEX) 4 MG tablet Take 1 tablet (4 mg total) by mouth at bedtime. 06/10/19   Judi Saa, DO  Vitamin D, Ergocalciferol, (DRISDOL) 1.25 MG (50000 UNIT) CAPS capsule Take 1 capsule (50,000 Units total) by mouth every 7 (seven) days. 11/10/19   Judi Saa, DO  WEGOVY 0.25 MG/0.5ML SOAJ Inject 0.25 mg into the  skin once a week. 05/05/23   [provider]      Allergies    Iodine, Other, Shellfish allergy, Codeine, Menthol, Morphine and codeine, Penicillins, Sudafed [pseudoephedrine hcl], Azithromycin, and Erythromycin    Review of Systems   Review of Systems  Cardiovascular:  Positive for chest pain.    Physical Exam Updated Vital Signs BP 137/73   Pulse 74   Temp 98.6 F (37 C) (Oral)   Resp (!) 22   Ht 1.53 m (5' 0.25")   Wt 109.5 kg   SpO2 98%   BMI 46.77 kg/m  Physical Exam Vitals and nursing note reviewed.  Constitutional:      General: She is not in acute distress.    Appearance: She is well-developed.  HENT:     Head: Normocephalic and atraumatic.     Right Ear: External ear normal.     Left Ear: External ear normal.  Eyes:     General: No scleral icterus.       Right eye: No discharge.        Left eye: No discharge.     Conjunctiva/sclera: Conjunctivae normal.  Neck:     Trachea: No tracheal deviation.  Cardiovascular:     Rate and Rhythm: Normal rate and regular rhythm.  Pulmonary:     Effort: Pulmonary effort is normal. No respiratory distress.     Breath sounds: Normal breath sounds. No stridor. No wheezing or rales.  Abdominal:     General: Bowel sounds are normal. There is no distension.     Palpations: Abdomen is soft.     Tenderness: There is no abdominal tenderness. There is no guarding or rebound.  Musculoskeletal:        General: No tenderness or deformity.     Cervical back: Neck supple.  Skin:    General: Skin is warm and dry.     Findings: No rash.  Neurological:     General: No focal deficit present.     Mental Status: She is alert.     Cranial Nerves: No cranial nerve deficit, dysarthria or facial asymmetry.     Sensory: No sensory deficit.     Motor: No abnormal muscle tone or seizure activity.     Coordination: Coordination normal.  Psychiatric:        Mood and Affect: Mood normal.     ED Results / Procedures / Treatments    Labs (all labs ordered are listed, but only abnormal results are displayed) Labs Reviewed  BASIC METABOLIC PANEL WITH GFR - Abnormal; Notable for the following components:      Result Value   Potassium 3.3 (*)    Glucose, Bld 107 (*)    BUN 24 (*)    Creatinine, Ser 1.49 (*)  GFR, Estimated 43 (*)    All other components within normal limits  CBC - Abnormal; Notable for the following components:   HCT 35.9 (*)    All other components within normal limits  CBG MONITORING, ED - Abnormal; Notable for the following components:   Glucose-Capillary 101 (*)    All other components within normal limits  HCG, SERUM, QUALITATIVE  D-DIMER, QUANTITATIVE  TROPONIN I (HIGH SENSITIVITY)  TROPONIN I (HIGH SENSITIVITY)    EKG None  Radiology DG Chest 2 View Result Date: 05/12/2023 CLINICAL DATA:  Chest pain, short of breath EXAM: CHEST - 2 VIEW COMPARISON:  03/09/2017 FINDINGS: The heart size and mediastinal contours are within normal limits. Both lungs are clear. The visualized skeletal structures are unremarkable. IMPRESSION: No active cardiopulmonary disease. Electronically Signed   By: Sharlet Salina M.D.   On: 05/12/2023 08:28    Procedures Procedures    Medications Ordered in ED Medications  sodium chloride 0.9 % bolus 500 mL (0 mLs Intravenous Stopped 05/12/23 1120)  ondansetron (ZOFRAN) injection 4 mg (4 mg Intravenous Given 05/12/23 1610)    ED Course/ Medical Decision Making/ A&P Clinical Course as of 05/12/23 1255  Tue May 12, 2023  0909 Troponin I (High Sensitivity) CBC normal.  Metabolic panel does show creatinine increased at 1.49 initial troponin normal [JK]  1049 D-dimer, quantitative D-dimer negative [JK]    Clinical Course User Index [JK] Linwood Dibbles, MD                                 Medical Decision Making Problems Addressed: Chest pain, unspecified type: acute illness or injury that poses a threat to life or bodily functions  Amount and/or Complexity of  Data Reviewed Labs: ordered. Decision-making details documented in ED Course. Radiology: ordered and independent interpretation performed.  Risk Prescription drug management.   Patient presented to the ED for evaluation of chest pain.  Patient does have a history of diabetes hypertension family history of heart disease.  ED workup reassuring.  Consider the possibility of acute coronary syndrome.  Serial troponins normal.  Overall low suspicion at this time.  However I do think she would benefit from outpatient cardiac workup concerning her risk factors.  Cardiology referral placed.  No Evidence of pneumonia, PTX.Marland Kitchen  Low risk for PE.  Dimer negative doubt PE  Evaluation and diagnostic testing in the emergency department does not suggest an emergent condition requiring admission or immediate intervention beyond what has been performed at this time.  The patient is safe for discharge and has been instructed to return immediately for worsening symptoms, change in symptoms or any other concerns.        Final Clinical Impression(s) / ED Diagnoses Final diagnoses:  Chest pain, unspecified type    Rx / DC Orders ED Discharge Orders          Ordered    pantoprazole (PROTONIX) 40 MG tablet  Daily        05/12/23 1254    Ambulatory referral to Cardiology       Comments: If you have not heard from the Cardiology office within the next 72 hours please call [phone].   05/12/23 1254              Linwood Dibbles, MD 05/12/23 1256

## 2023-06-04 NOTE — Progress Notes (Signed)
 Lynn Fields D.[facility] [address] [place] [address] Phone: [phone]   Assessment and Plan:     1. Primary osteoarthritis of both knees (Primary) 2. Chronic pain of left knee 3. Chronic pain of right knee -Chronic with exacerbation, subsequent visit - Consistent with recurrent flare of bilateral knee osteoarthritis - Patient had received significant relief after intra-articular CSI on 04/06/2023, however has had recurrent flares of pain over the past 1 week - Patient elected for repeat intra-articular CSI.  Discussed risks of injection within 3 months.  Patient verbalized understanding and wishes to proceed with injections.  CSI may temporarily increase blood glucose in patient with past medical history of DM type II - We will submit for prior authorization for bilateral Zilretta  injections which can be used at future office visit - Continue Tylenol  for day-to-day pain relief  Procedure: Knee Joint Injection Side: Bilateral Indication: Flare of osteoarthritis  Risks explained and consent was given verbally. The site was cleaned with alcohol prep. A needle was introduced with an anterio-lateral approach. Injection given using 2mL of 1% lidocaine  without epinephrine  and 1mL of kenalog  40mg /ml. This was well tolerated and resulted in symptomatic relief.  Needle was removed, hemostasis achieved, and post injection instructions were explained.  Procedure was repeated on contralateral side.  Pt was advised to call or return to clinic if these symptoms worsen or fail to improve as anticipated.    Pertinent previous records reviewed include none  Follow Up: As needed for recurrent knee pain.  Could consider Zilretta  injection once approved   Subjective:   I, [name] am a scribe for Dr. Cleora Daft.     Chief Complaint: bilat knee pain    HPI:  06/11/2022 Chronic problem with worsening symptoms.  Secondary to comorbidities as well as  social determinant of health and be physically and active patient is having difficulty with the weight loss.  Still needs to get BMI under 40 to consider the potential for knee replacement.  Discussed icing regimen and home exercises.  Discussed which activities to do and which ones to avoid.  Follow-up again in 6 to 8 weeks       Update 08/19/2022 Lynn Fields is a 53 y.o. female coming in with complaint of B knee pain. 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 Wants injections. Pain just started coming back about last week.     04/06/23 Patient states that she is in pain flare. She has been in flare for awhile now    06/05/2023 Patient states that her knees are hurting today. Did not make it to the 10 weeks like she wanted. Tried to hold out as long as she could before coming in. Hurt her L foot a few years back (saw Felipe Horton for it) and the same spot is hurting again. Does not know what she did to it this time to flare the pain back up.    Relevant Historical Information: Hypertension, DM type II, morbid obesity Additional pertinent review of systems negative.   Current Outpatient Medications:    acetaminophen  (TYLENOL ) 500 MG tablet, Take 1,000 mg by mouth every 6 (six) hours as needed for headache., Disp: , Rfl:    atorvastatin  (LIPITOR) 40 MG tablet, TAKE 1 TABLET BY MOUTH ONCE DAILY AT  6  PM (Patient taking differently: Take 40 mg by mouth daily. TAKE 1 TABLET BY MOUTH ONCE DAILY AT  6  PM), Disp: 90 tablet, Rfl: 1   Cholecalciferol  1.25 MG (50000 UT) capsule, Take 50,000 Units by mouth once a week., Disp: , Rfl:    cyclobenzaprine  (FLEXERIL ) 5 MG tablet, Take 5 mg by mouth 3 (three) times daily as needed for muscle spasms., Disp: , Rfl:    diclofenac  Sodium (VOLTAREN ) 1 % GEL, Apply 2 g topically 4 (four) times daily., Disp: 100 g, Rfl: 0   EPINEPHrine  (EPIPEN  JR) 0.15 MG/0.3ML injection, Inject 0.6 mLs (0.3 mg total) into the muscle once as needed for up to 1 dose for anaphylaxis.,  Disp: 2 each, Rfl: 0   glipiZIDE  (GLUCOTROL  XL) 5 MG 24 hr tablet, Take 1 tablet by mouth daily., Disp: , Rfl:    hydrochlorothiazide  (HYDRODIURIL ) 25 MG tablet, Take 1 tablet (25 mg total) by mouth daily., Disp: 90 tablet, Rfl: 1   losartan  (COZAAR ) 50 MG tablet, Take 1 tablet (50 mg total) by mouth daily., Disp: 90 tablet, Rfl: 1   meloxicam  (MOBIC ) 15 MG tablet, Take 1 tablet (15 mg total) by mouth daily as needed for pain., Disp: 30 tablet, Rfl: 0   metFORMIN  (GLUCOPHAGE -XR) 500 MG 24 hr tablet, Take 1,000 mg by mouth 2 (two) times daily., Disp: , Rfl:    montelukast  (SINGULAIR ) 10 MG tablet, Take 1 tablet (10 mg total) by mouth at bedtime., Disp: 90 tablet, Rfl: 1   Multiple Vitamins-Minerals (MULTIVITAMIN WITH MINERALS) tablet, Take 1 tablet by mouth daily., Disp: , Rfl:    ondansetron  (ZOFRAN  ODT) 4 MG disintegrating tablet, Take 1 tablet (4 mg total) by mouth every 8 (eight) hours as needed for nausea or vomiting., Disp: 20 tablet, Rfl: 0   pantoprazole  (PROTONIX ) 40 MG tablet, Take 1 tablet (40 mg total) by mouth daily., Disp: 30 tablet, Rfl: 0   potassium chloride SA (KLOR-CON M) 20 MEQ tablet, Take 1 tablet by mouth daily., Disp: , Rfl:    SUMAtriptan  (IMITREX ) 50 MG tablet, Take 1 tablet (50 mg total) by mouth once as needed for up to 1 dose for migraine. May repeat in 2 hours if headache persists or recurs., Disp: 10 tablet, Rfl: 0   tiZANidine  (ZANAFLEX ) 4 MG tablet, Take 1 tablet (4 mg total) by mouth at bedtime., Disp: 30 tablet, Rfl: 0   Vitamin D , Ergocalciferol , (DRISDOL ) 1.25 MG (50000 UNIT) CAPS capsule, Take 1 capsule (50,000 Units total) by mouth every 7 (seven) days., Disp: 12 capsule, Rfl: 0   WEGOVY 0.25 MG/0.5ML SOAJ, Inject 0.25 mg into the skin once a week., Disp: , Rfl:    Objective:     Vitals:   06/05/23 1005  BP: 124/80  Pulse: 80  SpO2: 98%  Weight: 253 lb (114.8 kg)  Height: 5' 0.25" (1.53 m)      Body mass index is 49 kg/m.    Physical Exam:     General:  awake, alert oriented, no acute distress nontoxic Skin: no suspicious lesions or rashes Neuro:sensation intact and strength 5/5 with no deficits, no atrophy, normal muscle tone Psych: No signs of anxiety, depression or other mood disorder   Bilateral knee: No gross deformity, though difficult to assess swelling based on body habitus Right ROM Flex 80, Ext 10 LeftROM Flex 80, Ext 10 TTP on right knee medial femoral condyle, medial joint line, lateral femoral condyle, lateral joint line, posterior fossa NTTP over the quad tendon,  patella, plica, patella tendon, tibial tuberostiy, fibular head,  , pes anserine bursa, gerdy's tubercle,      Gait abnormal, using single crutch for assistance, favoring left leg  Electronically signed by:  Marshall Skeeter D.[facility] 12:10 PM 06/05/23

## 2023-06-05 ENCOUNTER — Ambulatory Visit: Admitting: Sports Medicine

## 2023-06-05 VITALS — BP 124/80 | HR 80 | Ht 60.25 in | Wt 253.0 lb

## 2023-06-05 DIAGNOSIS — G8929 Other chronic pain: Secondary | ICD-10-CM

## 2023-06-05 DIAGNOSIS — M25561 Pain in right knee: Secondary | ICD-10-CM | POA: Diagnosis not present

## 2023-06-05 DIAGNOSIS — M25562 Pain in left knee: Secondary | ICD-10-CM

## 2023-06-05 DIAGNOSIS — M170 Bilateral primary osteoarthritis of knee: Secondary | ICD-10-CM | POA: Diagnosis not present

## 2023-06-05 NOTE — Patient Instructions (Signed)
 Going to get approval for Zilretta  for bilateral knees. Follow up ask for Zilretta  injections. Injection in knees today. Follow up as needed.

## 2023-06-08 ENCOUNTER — Telehealth: Payer: Self-pay

## 2023-06-08 NOTE — Telephone Encounter (Signed)
 Patient has an appointment on 07/07/2023. Medication needs to be ordered.  Zilretta  approved for bilateral knees.  No prior authorization, medical notes or referrals needed. Patient has a Fully Funded [facility] plan with an effective date of 04/28/2022. Plan follows Medicaid guidelines. [employer] verified benefits for the patient's Plan type. The Payer states that the patient is covered at 100% of the Medicaid fee schedule for J3304 (Zilretta ) and CPT code 09604. There is no deductible or out of pocket maximum that applies to these services. Please contact your state Medicaid plan directly for allowable/reimbursement information. The patient has no liability other than an up to $4 copay for office visit.  Case number: [id number] Exp: 07/03/2023

## 2023-06-08 NOTE — Telephone Encounter (Signed)
 Noted.

## 2023-06-11 NOTE — Telephone Encounter (Signed)
 Oh! Yes, that would be correct. Patient would need an appointment with Dr. Cleora Daft for Zilretta  injection since Dr. Felipe Horton does not do these injections.

## 2023-06-11 NOTE — Telephone Encounter (Signed)
 Looks like patient is scheduled with Felipe Horton. He does not give Zilretta  injections. Dr. Cleora Daft was the one who ordered it.  I am assuming patient needs to be scheduled with Dr. Cleora Daft to get this injection

## 2023-06-12 NOTE — Telephone Encounter (Signed)
 Left message for patient to call back to schedule appointment with Cleora Daft.

## 2023-06-30 NOTE — Progress Notes (Deleted)
 [facility] [address] [place] [address] Phone: [phone] Subjective:    I'm seeing this patient by the request  of:  Royston Cornea Leland Purser, NP  CC:   [id number]  Lynn Fields is a 53 y.o. female coming in with complaint of B knee pain. Last seen in July 2024. Zilretta  approved for B knees. Patient states     Past Medical History:  Diagnosis Date   Allergy    Arthritis    knees   Depression    Diabetes mellitus without complication (HCC)    Hypertension    Migraines    Ulcer    Past Surgical History:  Procedure Laterality Date   CESAREAN SECTION  1993   COLONOSCOPY WITH PROPOFOL  N/A 08/08/2019   Procedure: COLONOSCOPY WITH PROPOFOL ;  Surgeon: Lindle Rhea, MD;  Location: [facility];  Service: Gastroenterology;  Laterality: N/A;   HYSTEROSCOPY WITH NOVASURE N/A 06/02/2016   Procedure: HYSTEROSCOPY WITH NOVASURE;  Surgeon: Rogene Claude, MD;  Location: [facility];  Service: Gynecology;  Laterality: N/A;   TUBAL LIGATION  1993   Social History   Socioeconomic History   Marital status: Divorced    Spouse name: Not on file   Number of children: Not on file   Years of education: 16   Highest education level: Not on file  Occupational History   Occupation: Magazine features editor: [employer]  Tobacco Use   Smoking status: Never   Smokeless tobacco: Never  Vaping Use   Vaping status: Never Used  Substance and Sexual Activity   Alcohol use: Not Currently    Comment: occ   Drug use: No   Sexual activity: Yes    Birth control/protection: Surgical  Other Topics Concern   Not on file  Social History Narrative   Regular exercise-yes   Caffeine Use-no   Social Drivers of Health   Financial Resource Strain: Not on file  Food Insecurity: No Food Insecurity (10/28/2021)   [facility] to [facility] visits prior to  03/29/2022., [facility]   Hunger Vital Sign    Worried About Running Out of Food in the Last Year: Never true    Ran Out of Food in the Last Year: Never true  Transportation Needs: No Transportation Needs (10/28/2021)   [facility] prior to 03/29/2022[facility] prior to 03/29/2022.   PRAPARE - Administrator, Civil Service (Medical): No    Lack of Transportation (Non-Medical): No  Physical Activity: Unknown (03/11/2021)   Received from [facility], [facility]   Exercise Vital Sign    Days of Exercise per Week: 0 days    Minutes of Exercise per Session: Not on file  Stress: Not on file  Social Connections: Unknown (05/28/2021)   Received from [facility], [facility]   Social Network    Social Network: Not on file   Allergies  Allergen Reactions   Iodine Anaphylaxis   Other Anaphylaxis    Hair dye   Shellfish Allergy Anaphylaxis    Closes throat   Codeine Itching   Menthol Hives   Morphine And Codeine Itching   Penicillins Swelling    Has patient had a PCN reaction causing immediate rash, facial/tongue/throat swelling, SOB or lightheadedness with hypotension: Yes Has patient had a PCN reaction causing severe  rash involving mucus membranes or skin necrosis: No Has patient had a PCN reaction that required hospitalization No Has patient had a PCN reaction occurring within the last 10 years: No If all of the above answers are "NO", then may proceed with Cephalosporin use.   Sudafed [Pseudoephedrine Hcl] Swelling   Azithromycin Rash   Erythromycin Rash   Family History  Problem Relation Age of Onset   Diabetes Mother    Hypertension Mother    Heart disease Mother    Hyperlipidemia Mother    Diabetes Father    Hypertension Father    Heart disease Father    Hyperlipidemia Father    Crohn's disease Father    Cervical cancer Sister    Heart disease Sister     Diabetes Sister    Heart disease Sister    Diabetes Sister    Colon polyps Neg Hx    Esophageal cancer Neg Hx    Stomach cancer Neg Hx    Rectal cancer Neg Hx     Current Outpatient Medications (Endocrine & Metabolic):    glipiZIDE  (GLUCOTROL  XL) 5 MG 24 hr tablet, Take 1 tablet by mouth daily.   metFORMIN  (GLUCOPHAGE -XR) 500 MG 24 hr tablet, Take 1,000 mg by mouth 2 (two) times daily.  Current Outpatient Medications (Cardiovascular):    atorvastatin  (LIPITOR) 40 MG tablet, TAKE 1 TABLET BY MOUTH ONCE DAILY AT  6  PM (Patient taking differently: Take 40 mg by mouth daily. TAKE 1 TABLET BY MOUTH ONCE DAILY AT  6  PM)   EPINEPHrine  (EPIPEN  JR) 0.15 MG/0.3ML injection, Inject 0.6 mLs (0.3 mg total) into the muscle once as needed for up to 1 dose for anaphylaxis.   hydrochlorothiazide  (HYDRODIURIL ) 25 MG tablet, Take 1 tablet (25 mg total) by mouth daily.   losartan  (COZAAR ) 50 MG tablet, Take 1 tablet (50 mg total) by mouth daily.  Current Outpatient Medications (Respiratory):    montelukast  (SINGULAIR ) 10 MG tablet, Take 1 tablet (10 mg total) by mouth at bedtime.  Current Outpatient Medications (Analgesics):    acetaminophen  (TYLENOL ) 500 MG tablet, Take 1,000 mg by mouth every 6 (six) hours as needed for headache.   meloxicam  (MOBIC ) 15 MG tablet, Take 1 tablet (15 mg total) by mouth daily as needed for pain.   SUMAtriptan  (IMITREX ) 50 MG tablet, Take 1 tablet (50 mg total) by mouth once as needed for up to 1 dose for migraine. May repeat in 2 hours if headache persists or recurs.   Current Outpatient Medications (Other):    Cholecalciferol 1.25 MG (50000 UT) capsule, Take 50,000 Units by mouth once a week.   cyclobenzaprine  (FLEXERIL ) 5 MG tablet, Take 5 mg by mouth 3 (three) times daily as needed for muscle spasms.   diclofenac  Sodium (VOLTAREN ) 1 % GEL, Apply 2 g topically 4 (four) times daily.   Multiple Vitamins-Minerals (MULTIVITAMIN WITH MINERALS) tablet, Take 1 tablet by  mouth daily.   ondansetron  (ZOFRAN  ODT) 4 MG disintegrating tablet, Take 1 tablet (4 mg total) by mouth every 8 (eight) hours as needed for nausea or vomiting.   pantoprazole  (PROTONIX ) 40 MG tablet, Take 1 tablet (40 mg total) by mouth daily.   potassium chloride SA (KLOR-CON M) 20 MEQ tablet, Take 1 tablet by mouth daily.   tiZANidine  (ZANAFLEX ) 4 MG tablet, Take 1 tablet (4 mg total) by mouth at bedtime.   Vitamin D , Ergocalciferol , (DRISDOL ) 1.25 MG (50000 UNIT) CAPS capsule, Take 1 capsule (50,000 Units total) by mouth every 7 (  seven) days.   WEGOVY 0.25 MG/0.5ML SOAJ, Inject 0.25 mg into the skin once a week.   Reviewed prior external information including notes and imaging from  primary care provider As well as notes that were available from care everywhere and other healthcare systems.  Past medical history, social, surgical and family history all reviewed in electronic medical record.  No pertanent information unless stated regarding to the chief complaint.   Review of Systems:  No headache, visual changes, nausea, vomiting, diarrhea, constipation, dizziness, abdominal pain, skin rash, fevers, chills, night sweats, weight loss, swollen lymph nodes, body aches, joint swelling, chest pain, shortness of breath, mood changes. POSITIVE muscle aches  Objective  There were no vitals taken for this visit.   General: No apparent distress alert and oriented x3 mood and affect normal, dressed appropriately.  HEENT: Pupils equal, extraocular movements intact  Respiratory: Patient's speak in full sentences and does not appear short of breath  Cardiovascular: No lower extremity edema, non tender, no erythema      Impression and Recommendations:

## 2023-07-07 ENCOUNTER — Ambulatory Visit: Admitting: Family Medicine

## 2023-07-09 NOTE — Progress Notes (Signed)
 Lynn Fields [facility] [address] [place] [address] Phone: [phone]   Assessment and Plan:     1. Primary osteoarthritis of both knees 2. Chronic pain of left knee 3. Chronic pain of right knee  -Chronic with exacerbation, subsequent visit - Recurrence of bilateral knee pain most consistent with flare of osteoarthritis - Patient has only been receiving temporary relief from intra-articular CSI with most recent on 06/05/2023.  Patient opted for bilateral Zilretta  injection at today's visit.  Tolerated well per note below.  Zilretta  may temporarily increase blood glucose in patient with past medical history of DM type II - Continue Tylenol  for day-to-day pain relief  Procedure: Knee Joint Injection Side: Bilateral  Indication: Flare of osteoarthritis  Risks explained and consent was given verbally. The site was cleaned with alcohol prep. A needle was introduced with an anterio-lateral approach. Injection given using Zilretta  32 mg. This was well tolerated and resulted in symptomatic relief.  Needle was removed, hemostasis achieved, and post injection instructions were explained.   Pt was advised to call or return to clinic if these symptoms worsen or fail to improve as anticipated.   Pertinent previous records reviewed include none  Follow Up: 6 to 8 weeks for reevaluation.  Could discuss repeat Zilretta  injections in 3 months if beneficial.  Could discuss hyaluronic acid injections versus advanced imaging versus orthopedic surgery referral versus physical therapy   Subjective:   I, Lynn Fields am a scribe for Dr. Cleora Daft.    Chief Complaint: bilateral knee pain   HPI:   06/11/2022 Chronic problem with worsening symptoms.  Secondary to comorbidities as well as social determinant of health and be physically and active patient is having difficulty with the weight loss.  Still needs to get BMI under 40 to consider the potential for  knee replacement.  Discussed icing regimen and home exercises.  Discussed which activities to do and which ones to avoid.  Follow-up again in 6 to 8 weeks       Update 08/19/2022 Lynn Fields is a 53 y.o. female coming in with complaint of B knee pain. 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 Wants injections. Pain just started coming back about last week.     04/06/23 Patient states that she is in pain flare. She has been in flare for awhile now    06/05/2023 Patient states that her knees are hurting today. Did not make it to the 10 weeks like she wanted. Tried to hold out as long as she could before coming in. Hurt her L foot a few years back (saw Lynn Fields for it) and the same spot is hurting again. Does not know what she did to it this time to flare the pain back up.   07/10/23 Last visit patient was given another CSI injection and was informed we would run Zilretta  though insurance. Patient is approved for bilateral Zilretta  injections. Patient states knee pain is returned and would like bilateral injections.  Relevant Historical Information: Hypertension, DM type II, morbid obesity   Additional pertinent review of systems negative.   Current Outpatient Medications:    acetaminophen  (TYLENOL ) 500 MG tablet, Take 1,000 mg by mouth every 6 (six) hours as needed for headache., Disp: , Rfl:    atorvastatin  (LIPITOR) 40 MG tablet, TAKE 1 TABLET BY MOUTH ONCE DAILY AT  6  PM (Patient taking differently: Take 40 mg by mouth daily. TAKE 1 TABLET BY MOUTH ONCE DAILY AT  6  PM), Disp: 90 tablet, Rfl: 1   Cholecalciferol 1.25 MG (50000 UT) capsule, Take 50,000 Units by mouth once a week., Disp: , Rfl:    cyclobenzaprine  (FLEXERIL ) 5 MG tablet, Take 5 mg by mouth 3 (three) times daily as needed for muscle spasms., Disp: , Rfl:    diclofenac  Sodium (VOLTAREN ) 1 % GEL, Apply 2 g topically 4 (four) times daily., Disp: 100 g, Rfl: 0   EPINEPHrine  (EPIPEN  JR) 0.15 MG/0.3ML injection, Inject 0.6 mLs  (0.3 mg total) into the muscle once as needed for up to 1 dose for anaphylaxis., Disp: 2 each, Rfl: 0   glipiZIDE  (GLUCOTROL  XL) 5 MG 24 hr tablet, Take 1 tablet by mouth daily., Disp: , Rfl:    hydrochlorothiazide  (HYDRODIURIL ) 25 MG tablet, Take 1 tablet (25 mg total) by mouth daily., Disp: 90 tablet, Rfl: 1   losartan  (COZAAR ) 50 MG tablet, Take 1 tablet (50 mg total) by mouth daily., Disp: 90 tablet, Rfl: 1   meloxicam  (MOBIC ) 15 MG tablet, Take 1 tablet (15 mg total) by mouth daily as needed for pain., Disp: 30 tablet, Rfl: 0   metFORMIN  (GLUCOPHAGE -XR) 500 MG 24 hr tablet, Take 1,000 mg by mouth 2 (two) times daily., Disp: , Rfl:    montelukast  (SINGULAIR ) 10 MG tablet, Take 1 tablet (10 mg total) by mouth at bedtime., Disp: 90 tablet, Rfl: 1   Multiple Vitamins-Minerals (MULTIVITAMIN WITH MINERALS) tablet, Take 1 tablet by mouth daily., Disp: , Rfl:    ondansetron  (ZOFRAN  ODT) 4 MG disintegrating tablet, Take 1 tablet (4 mg total) by mouth every 8 (eight) hours as needed for nausea or vomiting., Disp: 20 tablet, Rfl: 0   pantoprazole  (PROTONIX ) 40 MG tablet, Take 1 tablet (40 mg total) by mouth daily., Disp: 30 tablet, Rfl: 0   potassium chloride SA (KLOR-CON M) 20 MEQ tablet, Take 1 tablet by mouth daily., Disp: , Rfl:    SUMAtriptan  (IMITREX ) 50 MG tablet, Take 1 tablet (50 mg total) by mouth once as needed for up to 1 dose for migraine. May repeat in 2 hours if headache persists or recurs., Disp: 10 tablet, Rfl: 0   tiZANidine  (ZANAFLEX ) 4 MG tablet, Take 1 tablet (4 mg total) by mouth at bedtime., Disp: 30 tablet, Rfl: 0   Vitamin D , Ergocalciferol , (DRISDOL ) 1.25 MG (50000 UNIT) CAPS capsule, Take 1 capsule (50,000 Units total) by mouth every 7 (seven) days., Disp: 12 capsule, Rfl: 0   WEGOVY 0.25 MG/0.5ML SOAJ, Inject 0.25 mg into the skin once a week., Disp: , Rfl:    Objective:     There were no vitals filed for this visit.    There is no height or weight on file to calculate BMI.     Physical Exam:    General:  awake, alert oriented, no acute distress nontoxic Skin: no suspicious lesions or rashes Neuro:sensation intact and strength 5/5 with no deficits, no atrophy, normal muscle tone Psych: No signs of anxiety, depression or other mood disorder   Bilateral knee: No gross deformity, though difficult to assess swelling based on body habitus Right ROM Flex 80, Ext 10 LeftROM Flex 80, Ext 10 TTP on right knee medial femoral condyle, medial joint line, lateral femoral condyle, lateral joint line, posterior fossa NTTP over the quad tendon,  patella, plica, patella tendon, tibial tuberostiy, fibular head,  , pes anserine bursa, gerdy's tubercle,      Gait abnormal, using single crutch for assistance, favoring left leg    Electronically  signed by:  Marshall Skeeter D.[facility] 4:36 PM 07/09/23

## 2023-07-10 ENCOUNTER — Ambulatory Visit (INDEPENDENT_AMBULATORY_CARE_PROVIDER_SITE_OTHER): Admitting: Sports Medicine

## 2023-07-10 VITALS — BP 120/74 | HR 84 | Ht 60.25 in | Wt 241.0 lb

## 2023-07-10 DIAGNOSIS — M170 Bilateral primary osteoarthritis of knee: Secondary | ICD-10-CM | POA: Diagnosis not present

## 2023-07-10 DIAGNOSIS — G8929 Other chronic pain: Secondary | ICD-10-CM | POA: Diagnosis not present

## 2023-07-10 MED ORDER — TRIAMCINOLONE ACETONIDE 32 MG IX SRER
32.0000 mg | Freq: Once | INTRA_ARTICULAR | Status: AC
Start: 2023-07-10 — End: 2023-07-10
  Administered 2023-07-10: 32 mg via INTRA_ARTICULAR

## 2023-07-10 MED ORDER — TRIAMCINOLONE ACETONIDE 32 MG IX SRER
32.0000 mg | Freq: Once | INTRA_ARTICULAR | Status: AC
  Administered 2023-07-10: 32 mg via INTRA_ARTICULAR

## 2023-07-10 NOTE — Patient Instructions (Signed)
 Zilrettta injections today. Follow up in 6 weeks.

## 2023-08-21 NOTE — Progress Notes (Signed)
 Ben Arayah Krouse D.[facility] [address] [place] [address] Phone: [phone]   Assessment and Plan:     1. Primary osteoarthritis of both knees 2. Chronic pain of left knee 3. Chronic pain of right knee  -Chronic with exacerbation, subsequent visit - Recurrence of bilateral knee pain consistent with flare of osteoarthritis - Patient has been getting temporary relief, 6-9 weeks, from Zilretta  injections.  Discussed that we can only perform Zilretta  injections once every 3 months.  Patient is interested in supplementing Zilretta  injections with HA injections.  Will obtain prior authorization and outpatient follow-up in clinic for bilateral HA injections - Use Tylenol  500 to 1000 mg tablets 2-3 times a day for day-to-day pain relief - Recommend limiting p.o. NSAID and steroid use with past medical history DM type II and CKD  Pertinent previous records reviewed include none  Follow Up: Will contact patient once HA has been approved to follow-up for injections.  Would also discuss ongoing foot pain at that time   Subjective:   I, Chestine Reeves, am serving as a Neurosurgeon for Doctor Morene Mace  Chief Complaint: bilateral knee pain    HPI:    06/11/2022 Chronic problem with worsening symptoms.  Secondary to comorbidities as well as social determinant of health and be physically and active patient is having difficulty with the weight loss.  Still needs to get BMI under 40 to consider the potential for knee replacement.  Discussed icing regimen and home exercises.  Discussed which activities to do and which ones to avoid.  Follow-up again in 6 to 8 weeks       Update 08/19/2022 Lynn Fields is a 53 y.o. female coming in with complaint of B knee pain. 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 Wants injections. Pain just started coming back about last week.     04/06/23 Patient states that she is in pain flare. She has been in flare for  awhile now    06/05/2023 Patient states that her knees are hurting today. Did not make it to the 10 weeks like she wanted. Tried to hold out as long as she could before coming in. Hurt her L foot a few years back (saw Claudene for it) and the same spot is hurting again. Does not know what she did to it this time to flare the pain back up.    07/10/23 Last visit patient was given another CSI injection and was informed we would run Zilretta  though insurance. Patient is approved for bilateral Zilretta  injections. Patient states knee pain is returned and would like bilateral injections.  08/24/2023 Patient states  10 weeks is now pushing it to far . Week 9 is when the pain started coming back   Relevant Historical Information: Hypertension, DM type II, morbid obesity   Additional pertinent review of systems negative.   Current Outpatient Medications:    acetaminophen  (TYLENOL ) 500 MG tablet, Take 1,000 mg by mouth every 6 (six) hours as needed for headache., Disp: , Rfl:    atorvastatin  (LIPITOR) 40 MG tablet, TAKE 1 TABLET BY MOUTH ONCE DAILY AT  6  PM (Patient taking differently: Take 40 mg by mouth daily. TAKE 1 TABLET BY MOUTH ONCE DAILY AT  6  PM), Disp: 90 tablet, Rfl: 1   Cholecalciferol 1.25 MG (50000 UT) capsule, Take 50,000 Units by mouth once a week., Disp: , Rfl:    cyclobenzaprine  (FLEXERIL ) 5 MG tablet, Take 5 mg by mouth 3 (  three) times daily as needed for muscle spasms., Disp: , Rfl:    diclofenac  Sodium (VOLTAREN ) 1 % GEL, Apply 2 g topically 4 (four) times daily., Disp: 100 g, Rfl: 0   EPINEPHrine  (EPIPEN  JR) 0.15 MG/0.3ML injection, Inject 0.6 mLs (0.3 mg total) into the muscle once as needed for up to 1 dose for anaphylaxis., Disp: 2 each, Rfl: 0   glipiZIDE  (GLUCOTROL  XL) 5 MG 24 hr tablet, Take 1 tablet by mouth daily., Disp: , Rfl:    hydrochlorothiazide  (HYDRODIURIL ) 25 MG tablet, Take 1 tablet (25 mg total) by mouth daily., Disp: 90 tablet, Rfl: 1   losartan  (COZAAR ) 50 MG  tablet, Take 1 tablet (50 mg total) by mouth daily., Disp: 90 tablet, Rfl: 1   meloxicam  (MOBIC ) 15 MG tablet, Take 1 tablet (15 mg total) by mouth daily as needed for pain., Disp: 30 tablet, Rfl: 0   metFORMIN  (GLUCOPHAGE -XR) 500 MG 24 hr tablet, Take 1,000 mg by mouth 2 (two) times daily., Disp: , Rfl:    montelukast  (SINGULAIR ) 10 MG tablet, Take 1 tablet (10 mg total) by mouth at bedtime., Disp: 90 tablet, Rfl: 1   Multiple Vitamins-Minerals (MULTIVITAMIN WITH MINERALS) tablet, Take 1 tablet by mouth daily., Disp: , Rfl:    ondansetron  (ZOFRAN  ODT) 4 MG disintegrating tablet, Take 1 tablet (4 mg total) by mouth every 8 (eight) hours as needed for nausea or vomiting., Disp: 20 tablet, Rfl: 0   pantoprazole  (PROTONIX ) 40 MG tablet, Take 1 tablet (40 mg total) by mouth daily., Disp: 30 tablet, Rfl: 0   potassium chloride SA (KLOR-CON M) 20 MEQ tablet, Take 1 tablet by mouth daily., Disp: , Rfl:    SUMAtriptan  (IMITREX ) 50 MG tablet, Take 1 tablet (50 mg total) by mouth once as needed for up to 1 dose for migraine. May repeat in 2 hours if headache persists or recurs., Disp: 10 tablet, Rfl: 0   tiZANidine  (ZANAFLEX ) 4 MG tablet, Take 1 tablet (4 mg total) by mouth at bedtime., Disp: 30 tablet, Rfl: 0   Vitamin D , Ergocalciferol , (DRISDOL ) 1.25 MG (50000 UNIT) CAPS capsule, Take 1 capsule (50,000 Units total) by mouth every 7 (seven) days., Disp: 12 capsule, Rfl: 0   WEGOVY 0.25 MG/0.5ML SOAJ, Inject 0.25 mg into the skin once a week., Disp: , Rfl:    Objective:     Vitals:   08/24/23 1440  Pulse: 88  SpO2: 98%  Weight: 243 lb (110.2 kg)  Height: 5' (1.524 m)      Body mass index is 47.46 kg/m.    Physical Exam:    General:  awake, alert oriented, no acute distress nontoxic Skin: no suspicious lesions or rashes Neuro:sensation intact and strength 5/5 with no deficits, no atrophy, normal muscle tone Psych: No signs of anxiety, depression or other mood disorder   Bilateral knee: No  gross deformity, though difficult to assess swelling based on body habitus Right ROM Flex 100, Ext 10 LeftROM Flex 80, Ext 10 TTP on left knee however medial femoral condyle, medial joint line, lateral femoral condyle, lateral joint line, posterior fossa NTTP over the quad tendon,  patella, plica, patella tendon, tibial tuberostiy, fibular head,  , pes anserine bursa, gerdy's tubercle,      Gait abnormal, using crutches for assistance and favoring right leg    Electronically signed by:  Odis Mace D.[facility] 4:09 PM 08/24/23

## 2023-08-24 ENCOUNTER — Ambulatory Visit: Admitting: Sports Medicine

## 2023-08-24 ENCOUNTER — Telehealth: Payer: Self-pay | Admitting: Sports Medicine

## 2023-08-24 VITALS — HR 88 | Ht 60.0 in | Wt 243.0 lb

## 2023-08-24 DIAGNOSIS — M25561 Pain in right knee: Secondary | ICD-10-CM

## 2023-08-24 DIAGNOSIS — M170 Bilateral primary osteoarthritis of knee: Secondary | ICD-10-CM

## 2023-08-24 DIAGNOSIS — G8929 Other chronic pain: Secondary | ICD-10-CM

## 2023-08-24 DIAGNOSIS — M25562 Pain in left knee: Secondary | ICD-10-CM | POA: Diagnosis not present

## 2023-08-24 NOTE — Telephone Encounter (Signed)
Closing on my end.

## 2023-08-24 NOTE — Telephone Encounter (Addendum)
 Request for gel for bilateral knees. Patient ran for Monovisc on 08/24/23.  Case [id number]. Pending approval.

## 2023-08-24 NOTE — Patient Instructions (Signed)
 Bilat HA  Will contact you to schedule injections

## 2023-08-28 ENCOUNTER — Telehealth: Payer: Self-pay

## 2023-08-28 NOTE — Telephone Encounter (Signed)
 Called and left patient a message stating that she needs to call the office back for some information required for her gel authorization for her knees. Patient needs to upload an updated insurance card so that we can continue with gel approval through [employer]. The office does not have a recent one on file. Mychart message was sent previously but patient has not responded yet.

## 2023-09-16 NOTE — Telephone Encounter (Signed)
 [employer] has cancelled the request for Monovisc due to not having the current insurance card. Called and mychart messaged patient to try and get this information, but have not gotten any response. Once patient provides this information, the patient will have to be re-run through [employer] portal for authorization again.

## 2023-10-28 NOTE — Progress Notes (Unsigned)
 [facility] [address] [place] [address] Phone: [phone]   Assessment and Plan:     1. Primary osteoarthritis of both knees (Primary) 2. Chronic pain of left knee 3. Chronic pain of right knee -Chronic with exacerbation, subsequent visit - Consistent with recurrent flare of bilateral knee osteoarthritis - Patient has been receiving temporary relief, generally 6 to 9 weeks from Zilretta  injections.  Lasoride injections performed on 07/10/2023.  Elected for repeat Zilretta  injections today. - Patient could benefit from supplementing Zilretta  injections with HA injections.  Will update patient's insurance card and resubmit prior authorization - Use Tylenol  500 to 1000 mg tablets 2-3 times a day for day-to-day pain relief - Recommend limiting p.o. NSAID and steroid use past medical history of DM type II and CKD  Procedure: Knee Joint Injection Side: Bilateral Indication: Flare of osteoarthritis  Risks explained and consent was given verbally. The site was cleaned with alcohol prep. A needle was introduced with an anterio-lateral approach. Injection given using Zilretta  32 mg.  This was well tolerated.  Needle was removed, hemostasis achieved, and post injection instructions were explained.  Seizure was repeated on contralateral side.  Pt was advised to call or return to clinic if these symptoms worsen or fail to improve as anticipated.     Pertinent previous records reviewed include none   Follow Up: We will contact patient once HA injections have been approved to follow-up for injections.   Subjective:   I, Lynn Fields, am serving as a Neurosurgeon for Doctor Morene Mace   Chief Complaint: bilateral knee pain    HPI:    06/11/2022 Chronic problem with worsening symptoms.  Secondary to comorbidities as well as social determinant of health and be physically and active patient is having difficulty with the weight loss.   Still needs to get BMI under 40 to consider the potential for knee replacement.  Discussed icing regimen and home exercises.  Discussed which activities to do and which ones to avoid.  Follow-up again in 6 to 8 weeks   Update 08/19/2022 Lynn Fields is a 53 y.o. female coming in with complaint of B knee pain. 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 Wants injections. Pain just started coming back about last week.     04/06/23 Patient states that she is in pain flare. She has been in flare for awhile now    06/05/2023 Patient states that her knees are hurting today. Did not make it to the 10 weeks like she wanted. Tried to hold out as long as she could before coming in. Hurt her L foot a few years back (saw Claudene for it) and the same spot is hurting again. Does not know what she did to it this time to flare the pain back up.    07/10/23 Last visit patient was given another CSI injection and was informed we would run Zilretta  though insurance. Patient is approved for bilateral Zilretta  injections. Patient states knee pain is returned and would like bilateral injections.   08/24/2023 Patient states  10 weeks is now pushing it to far . Week 9 is when the pain started coming back   10/29/2023 Patient states she is ready for zilretta . Pain came back completely 2 weeks ago    Relevant Historical Information: Hypertension, DM type II, morbid obesity   Additional pertinent review of systems negative.   Current Outpatient Medications:    acetaminophen  (TYLENOL ) 500 MG tablet, Take 1,000  mg by mouth every 6 (six) hours as needed for headache., Disp: , Rfl:    atorvastatin  (LIPITOR) 40 MG tablet, TAKE 1 TABLET BY MOUTH ONCE DAILY AT  6  PM (Patient taking differently: Take 40 mg by mouth daily. TAKE 1 TABLET BY MOUTH ONCE DAILY AT  6  PM), Disp: 90 tablet, Rfl: 1   Cholecalciferol 1.25 MG (50000 UT) capsule, Take 50,000 Units by mouth once a week., Disp: , Rfl:    cyclobenzaprine  (FLEXERIL ) 5 MG  tablet, Take 5 mg by mouth 3 (three) times daily as needed for muscle spasms., Disp: , Rfl:    diclofenac  Sodium (VOLTAREN ) 1 % GEL, Apply 2 g topically 4 (four) times daily., Disp: 100 g, Rfl: 0   EPINEPHrine  (EPIPEN  JR) 0.15 MG/0.3ML injection, Inject 0.6 mLs (0.3 mg total) into the muscle once as needed for up to 1 dose for anaphylaxis., Disp: 2 each, Rfl: 0   glipiZIDE  (GLUCOTROL  XL) 5 MG 24 hr tablet, Take 1 tablet by mouth daily., Disp: , Rfl:    hydrochlorothiazide  (HYDRODIURIL ) 25 MG tablet, Take 1 tablet (25 mg total) by mouth daily., Disp: 90 tablet, Rfl: 1   losartan  (COZAAR ) 50 MG tablet, Take 1 tablet (50 mg total) by mouth daily., Disp: 90 tablet, Rfl: 1   meloxicam  (MOBIC ) 15 MG tablet, Take 1 tablet (15 mg total) by mouth daily as needed for pain., Disp: 30 tablet, Rfl: 0   metFORMIN  (GLUCOPHAGE -XR) 500 MG 24 hr tablet, Take 1,000 mg by mouth 2 (two) times daily., Disp: , Rfl:    montelukast  (SINGULAIR ) 10 MG tablet, Take 1 tablet (10 mg total) by mouth at bedtime., Disp: 90 tablet, Rfl: 1   Multiple Vitamins-Minerals (MULTIVITAMIN WITH MINERALS) tablet, Take 1 tablet by mouth daily., Disp: , Rfl:    ondansetron  (ZOFRAN  ODT) 4 MG disintegrating tablet, Take 1 tablet (4 mg total) by mouth every 8 (eight) hours as needed for nausea or vomiting., Disp: 20 tablet, Rfl: 0   pantoprazole  (PROTONIX ) 40 MG tablet, Take 1 tablet (40 mg total) by mouth daily., Disp: 30 tablet, Rfl: 0   potassium chloride SA (KLOR-CON M) 20 MEQ tablet, Take 1 tablet by mouth daily., Disp: , Rfl:    SUMAtriptan  (IMITREX ) 50 MG tablet, Take 1 tablet (50 mg total) by mouth once as needed for up to 1 dose for migraine. May repeat in 2 hours if headache persists or recurs., Disp: 10 tablet, Rfl: 0   tiZANidine  (ZANAFLEX ) 4 MG tablet, Take 1 tablet (4 mg total) by mouth at bedtime., Disp: 30 tablet, Rfl: 0   Vitamin D , Ergocalciferol , (DRISDOL ) 1.25 MG (50000 UNIT) CAPS capsule, Take 1 capsule (50,000 Units total) by  mouth every 7 (seven) days., Disp: 12 capsule, Rfl: 0   WEGOVY 0.25 MG/0.5ML SOAJ, Inject 0.25 mg into the skin once a week., Disp: , Rfl:    Objective:     Vitals:   10/29/23 0822  Weight: 245 lb (111.1 kg)  Height: 5' (1.524 m)      Body mass index is 47.85 kg/m.    Physical Exam:    General:  awake, alert oriented, no acute distress nontoxic Skin: no suspicious lesions or rashes Neuro:sensation intact and strength 5/5 with no deficits, no atrophy, normal muscle tone Psych: No signs of anxiety, depression or other mood disorder   Bilateral knee: No gross deformity, though difficult to assess swelling based on body habitus Right ROM Flex 100, Ext 10 LeftROM Flex 80, Ext 10 TTP  on left knee however medial femoral condyle, medial joint line, lateral femoral condyle, lateral joint line, posterior fossa NTTP over the quad tendon,  patella, plica, patella tendon, tibial tuberostiy, fibular head,  , pes anserine bursa, gerdy's tubercle,      Gait abnormal, using crutches for assistance and favoring right leg    Electronically signed by:  Odis Mace [facility] 8:28 AM 10/29/23

## 2023-10-29 ENCOUNTER — Ambulatory Visit: Admitting: Sports Medicine

## 2023-10-29 ENCOUNTER — Telehealth: Payer: Self-pay | Admitting: Sports Medicine

## 2023-10-29 VITALS — Ht 60.0 in | Wt 245.0 lb

## 2023-10-29 DIAGNOSIS — M25562 Pain in left knee: Secondary | ICD-10-CM | POA: Diagnosis not present

## 2023-10-29 DIAGNOSIS — M25561 Pain in right knee: Secondary | ICD-10-CM

## 2023-10-29 DIAGNOSIS — M170 Bilateral primary osteoarthritis of knee: Secondary | ICD-10-CM

## 2023-10-29 DIAGNOSIS — G8929 Other chronic pain: Secondary | ICD-10-CM

## 2023-10-29 MED ORDER — TRIAMCINOLONE ACETONIDE 32 MG IX SRER
32.0000 mg | Freq: Once | INTRA_ARTICULAR | Status: AC
  Administered 2023-10-29: 32 mg via INTRA_ARTICULAR

## 2023-10-29 NOTE — Patient Instructions (Signed)
 Bilat HA approval    We will contact you once approved

## 2023-10-29 NOTE — Telephone Encounter (Signed)
 Patient ran for Monovisc for bilateral knees on 10/29/2023. Case [id number]. Pending approval.

## 2023-10-29 NOTE — Telephone Encounter (Signed)
 Bilat HA

## 2023-11-02 NOTE — Telephone Encounter (Signed)
 PA required through Medicaid. Can you do this through the portal please. My access still doesn't work for [employer].

## 2023-11-03 NOTE — Telephone Encounter (Signed)
 Faxed pre cert form with clinicals to [employer] at [phone]

## 2023-12-11 NOTE — Telephone Encounter (Signed)
 Spoke to patient. She is going to talk to her boss to find a day that she is able to come.

## 2023-12-11 NOTE — Telephone Encounter (Signed)
 Can you please schedule patient when medication is stocked   Durolane authorized for bilateral knee NO PRE CERT REQUIRED  Copay $4 Deductible does not apply OOP does not apply Medical notes may be requested to submit claim  Reference # [id number]

## 2023-12-14 NOTE — Telephone Encounter (Signed)
 Noted

## 2023-12-14 NOTE — Telephone Encounter (Signed)
 Scheduled with Lynn Fields 12/22/2023.

## 2023-12-18 ENCOUNTER — Emergency Department (HOSPITAL_COMMUNITY)
Admission: EM | Admit: 2023-12-18 | Discharge: 2023-12-18 | Disposition: A | Discharge status: Home / Self Care | Attending: Emergency Medicine | Admitting: Emergency Medicine

## 2023-12-18 ENCOUNTER — Other Ambulatory Visit: Payer: Self-pay

## 2023-12-18 DIAGNOSIS — T7840XA Allergy, unspecified, initial encounter: Secondary | ICD-10-CM | POA: Insufficient documentation

## 2023-12-18 DIAGNOSIS — T782XXA Anaphylactic shock, unspecified, initial encounter: Secondary | ICD-10-CM

## 2023-12-18 MED ORDER — PANTOPRAZOLE SODIUM 40 MG IV SOLR
40.0000 mg | Freq: Once | INTRAVENOUS | Status: AC
  Administered 2023-12-18: 40 mg via INTRAVENOUS
  Filled 2023-12-18: qty 10

## 2023-12-18 MED ORDER — LACTATED RINGERS IV BOLUS
1000.0000 mL | Freq: Once | INTRAVENOUS | Status: AC
  Administered 2023-12-18: 1000 mL via INTRAVENOUS

## 2023-12-18 MED ORDER — EPINEPHRINE 0.3 MG/0.3ML IJ SOAJ: 0.3000 mg | INTRAMUSCULAR | 0 refills | Status: AC | PRN

## 2023-12-18 MED ORDER — METHYLPREDNISOLONE SODIUM SUCC 40 MG IJ SOLR
40.0000 mg | Freq: Once | INTRAMUSCULAR | Status: AC
  Administered 2023-12-18: 40 mg via INTRAVENOUS
  Filled 2023-12-18: qty 1

## 2023-12-18 MED ORDER — EPINEPHRINE 0.3 MG/0.3ML IJ SOAJ
INTRAMUSCULAR | Status: AC
  Filled 2023-12-18: qty 0.3

## 2023-12-18 MED ORDER — ONDANSETRON HCL 4 MG/2ML IJ SOLN
4.0000 mg | Freq: Once | INTRAMUSCULAR | Status: AC
  Administered 2023-12-18: 4 mg via INTRAVENOUS
  Filled 2023-12-18: qty 2

## 2023-12-18 NOTE — ED Notes (Signed)
 EPI Pen administered by A. Alona, RN   RIGHT anterior thigh.   Dr. Mannie present.

## 2023-12-18 NOTE — ED Triage Notes (Signed)
 Pt ambulatory to triage reporting that she may have been exposed to shellfish at work. Pt reports that she began feeling itchy in her hands. Pt able to speak, no oral swelling.

## 2023-12-18 NOTE — Discharge Instructions (Signed)
 I have sent you a prescription for an EpiPen  to your pharmacy.  Please pick it up today.  Please take your EpiPen  if you begin to experience shortness of breath or swelling in your throat or tongue.  Follow-up with your primary care doctor within 1 week.

## 2023-12-18 NOTE — ED Provider Notes (Signed)
 [place] [facility] AT [facility] Provider Note   CSN: [id number] Arrival date & time: 12/18/23  1340     Patient presents with: Allergic Reaction   Lynn Fields is a 53 y.o. female.  {Add pertinent medical, surgical, social history, OB history to HPI:32947} This is a 54 year old female who is here today after a possible exposure to shellfish.  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  She reports that she feels though she is having a difficult time with her breathing.  She does have an EpiPen , but states it is expired so she did not use it.   Allergic Reaction      Prior to Admission medications   Medication Sig Start Date End Date Taking? Authorizing Provider  acetaminophen  (TYLENOL ) 500 MG tablet Take 1,000 mg by mouth every 6 (six) hours as needed for headache.    [provider]  atorvastatin  (LIPITOR) 40 MG tablet TAKE 1 TABLET BY MOUTH ONCE DAILY AT  6  PM Patient taking differently: Take 40 mg by mouth daily. TAKE 1 TABLET BY MOUTH ONCE DAILY AT  6  PM 04/28/19   Theophilus Andrews, Tully GRADE, MD  Cholecalciferol 1.25 MG (50000 UT) capsule Take 50,000 Units by mouth once a week.    [provider]  cyclobenzaprine  (FLEXERIL ) 5 MG tablet Take 5 mg by mouth 3 (three) times daily as needed for muscle spasms. 04/10/23   [provider]  diclofenac  Sodium (VOLTAREN ) 1 % GEL Apply 2 g topically 4 (four) times daily. 05/28/21   Claudene Arthea HERO, DO  EPINEPHrine  (EPIPEN  JR) 0.15 MG/0.3ML injection Inject 0.6 mLs (0.3 mg total) into the muscle once as needed for up to 1 dose for anaphylaxis. 10/26/18   Theophilus Andrews, Tully GRADE, MD  glipiZIDE  (GLUCOTROL  XL) 5 MG 24 hr tablet Take 1 tablet by mouth daily. 03/11/23 03/10/24  [provider]  hydrochlorothiazide  (HYDRODIURIL ) 25 MG tablet Take 1 tablet (25 mg total) by mouth daily. 04/28/19   Theophilus Andrews, Tully GRADE, MD  losartan  (COZAAR ) 50 MG tablet Take  1 tablet (50 mg total) by mouth daily. 04/28/19   Theophilus Andrews, Tully GRADE, MD  meloxicam  (MOBIC ) 15 MG tablet Take 1 tablet (15 mg total) by mouth daily as needed for pain. 05/27/22   Leonce Katz, DO  metFORMIN  (GLUCOPHAGE -XR) 500 MG 24 hr tablet Take 1,000 mg by mouth 2 (two) times daily. 03/11/23 03/10/24  [provider]  montelukast  (SINGULAIR ) 10 MG tablet Take 1 tablet (10 mg total) by mouth at bedtime. 04/28/19   Theophilus Andrews, Tully GRADE, MD  Multiple Vitamins-Minerals (MULTIVITAMIN WITH MINERALS) tablet Take 1 tablet by mouth daily.    [provider]  ondansetron  (ZOFRAN  ODT) 4 MG disintegrating tablet Take 1 tablet (4 mg total) by mouth every 8 (eight) hours as needed for nausea or vomiting. 04/28/19   Theophilus Andrews, Tully GRADE, MD  pantoprazole  (PROTONIX ) 40 MG tablet Take 1 tablet (40 mg total) by mouth daily. 05/12/23   Randol Simmonds, MD  potassium chloride SA (KLOR-CON M) 20 MEQ tablet Take 1 tablet by mouth daily. 10/05/21   [provider]  SUMAtriptan  (IMITREX ) 50 MG tablet Take 1 tablet (50 mg total) by mouth once as needed for up to 1 dose for migraine. May repeat in 2 hours if headache persists or recurs. 04/28/19   Theophilus Andrews, Tully GRADE, MD  tiZANidine  (ZANAFLEX ) 4 MG tablet Take 1 tablet (4 mg total) by mouth  at bedtime. 06/10/19   Smith, Zachary M, DO  Vitamin D , Ergocalciferol , (DRISDOL ) 1.25 MG (50000 UNIT) CAPS capsule Take 1 capsule (50,000 Units total) by mouth every 7 (seven) days. 11/10/19   Smith, Zachary M, DO  WEGOVY 0.25 MG/0.5ML SOAJ Inject 0.25 mg into the skin once a week. 05/05/23   [provider]    Allergies: Iodine, Other, Shellfish allergy, Codeine, Menthol, Morphine and codeine, Penicillins, Sudafed [pseudoephedrine hcl], Azithromycin, and Erythromycin    Review of Systems  Updated Vital Signs BP (!) 169/99   Pulse 91   Temp 97.8 F (36.6 C) (Oral)   Resp (!) 26   SpO2 100%   Physical Exam Vitals and nursing note  reviewed.  Constitutional:      General: She is in acute distress.  HENT:     Head: Normocephalic and atraumatic.  Eyes:     Pupils: Pupils are equal, round, and reactive to light.  Cardiovascular:     Rate and Rhythm: Normal rate.  Pulmonary:     Breath sounds: Stridor present. No wheezing.  Neurological:     General: No focal deficit present.     Mental Status: She is alert.     (all labs ordered are listed, but only abnormal results are displayed) Labs Reviewed - No data to display  EKG: None  Radiology: No results found.  {Document cardiac monitor, telemetry assessment procedure when appropriate:32947} .Critical Care  Performed by: Mannie Fairy DASEN, DO Authorized by: Mannie Fairy DASEN, DO   Critical care provider statement:    Critical care time (minutes):  35   Critical care was necessary to treat or prevent imminent or life-threatening deterioration of the following conditions:  Shock   Critical care was time spent personally by me on the following activities:  Development of treatment plan with patient or surrogate, discussions with consultants, evaluation of patient's response to treatment, examination of patient, ordering and review of laboratory studies, ordering and review of radiographic studies, ordering and performing treatments and interventions, pulse oximetry, re-evaluation of patient's condition and review of old charts    Medications Ordered in the ED  EPINEPHrine  (EPI-PEN) 0.3 mg/0.3 mL injection (  Given by Other 12/18/23 1351)  ondansetron  (ZOFRAN ) injection 4 mg (4 mg Intravenous Given 12/18/23 1408)  methylPREDNISolone  sodium succinate (SOLU-MEDROL ) 40 mg/mL injection 40 mg (40 mg Intravenous Given 12/18/23 1421)  pantoprazole  (PROTONIX ) injection 40 mg (40 mg Intravenous Given 12/18/23 1417)  lactated ringers  bolus 1,000 mL (1,000 mLs Intravenous New Bag/Given 12/18/23 1423)      {Click here for ABCD2, HEART and other calculators REFRESH Note  before signing:1}                              Medical Decision Making 53 year old female here today for an allergic reaction.  Differential diagnoses include anaphylaxis, less likely foreign body, less likely mass.  Plan -I was asked by triage RN to evaluate the patient.  I entered the room, patient speaking with a hoarse voice, did have stridor on my exam.  I instructed nursing staff to provide the patient with IM epinephrine  and bring the patient to one of our rooms.  Patient was brought back to one of our rooms, and her stridor had improved.  She started to endorse some nausea, provided with some Protonix , steroids and Zofran .  Will continue to monitor.  Reassessment 4:15 PM-patient continues to exhibit no symptoms.  2 hours since initial  epinephrine .  She is appropriate for discharge.  Will have her follow-up with her PCP.  EpiPen  sent to pharmacy.  3:15 PM-patient's symptoms are improved.  Will continue to monitor the patient here in the ED.  3-hour observation period would be  Risk Prescription drug management.     {Document critical care time when appropriate  Document review of labs and clinical decision tools ie CHADS2VASC2, etc  Document your independent review of radiology images and any outside records  Document your discussion with family members, caretakers and with consultants  Document social determinants of health affecting pt's care  Document your decision making why or why not admission, treatments were needed:32947:::1}   Final diagnoses:  None    ED Discharge Orders     None

## 2023-12-22 ENCOUNTER — Ambulatory Visit: Admitting: Sports Medicine

## 2023-12-22 DIAGNOSIS — G8929 Other chronic pain: Secondary | ICD-10-CM

## 2023-12-22 DIAGNOSIS — M25562 Pain in left knee: Secondary | ICD-10-CM

## 2023-12-22 DIAGNOSIS — M170 Bilateral primary osteoarthritis of knee: Secondary | ICD-10-CM | POA: Diagnosis not present

## 2023-12-22 DIAGNOSIS — M25561 Pain in right knee: Secondary | ICD-10-CM | POA: Diagnosis not present

## 2023-12-22 MED ORDER — SODIUM HYALURONATE 60 MG/3ML IX PRSY
60.0000 mg | PREFILLED_SYRINGE | Freq: Once | INTRA_ARTICULAR | Status: AC
  Administered 2023-12-22: 60 mg via INTRA_ARTICULAR

## 2023-12-22 NOTE — Addendum Note (Signed)
 Addended by: MAYBELL ALSTON R on: 12/22/2023 03:18 PM   Modules accepted: Orders

## 2023-12-22 NOTE — Patient Instructions (Signed)
-   Use meloxicam  15 mg daily as needed for breakthrough pain.  Recommend limiting chronic NSAIDs to 1-2 doses per week to prevent long-term side effects. Use Tylenol  500 to 1000 mg tablets 2-3 times a day as needed for day-to-day pain relief.    As needed follow up   Call and ask for repeat prior authorizations for zilretta  in 2026

## 2023-12-22 NOTE — Progress Notes (Addendum)
 [facility] [address] [place] [address] Phone: [phone]   Assessment and Plan:     1. Primary osteoarthritis of both knees (Primary) 2. Chronic pain of left knee 3. Chronic pain of right knee -Chronic with exacerbation, subsequent visit - Patient presents for procedure only visit for bilateral [place] injections.  Tolerated well per note below - Symptoms consistent with recurrent flare of bilateral knee osteoarthritis. - Use Tylenol  500 to 1000 mg tablets 2-3 times a day for day-to-day pain relief   - Use meloxicam  15 mg daily as needed for breakthrough pain.  Recommend limiting chronic NSAIDs to 1-2 doses per week to prevent long-term side effects. Use Tylenol  500 to 1000 mg tablets 2-3 times a day as needed for day-to-day pain relief.     Procedure: Knee Joint Injection Side: Bilateral Indication: Flare of osteoarthritis  Risks explained and consent was given verbally. The site was cleaned with alcohol prep. A needle was introduced with an anterio-lateral approach. Injection given using Durolane 60 mg.  This was well tolerated.  Needle was removed, hemostasis achieved, and post injection instructions were explained.  Procedure repeated on contralateral side.  Pt was advised to call or return to clinic if these symptoms worsen or fail to improve as anticipated.   Pertinent previous records reviewed include none   Follow Up: As needed.  Could consider alternating between Zilretta  injections and hyaluronic acid injections to better improve chronic knee pain   Subjective:   I, Lynn Fields, am serving as a neurosurgeon for Doctor Morene Mace   Chief Complaint: bilateral knee pain    HPI:    06/11/2022 Chronic problem with worsening symptoms.  Secondary to comorbidities as well as social determinant of health and be physically and active patient is having difficulty with the weight loss.  Still needs to get BMI under  40 to consider the potential for knee replacement.  Discussed icing regimen and home exercises.  Discussed which activities to do and which ones to avoid.  Follow-up again in 6 to 8 weeks   Update 08/19/2022 Lynn Fields is a 53 y.o. female coming in with complaint of B knee pain. 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 Wants injections. Pain just started coming back about last week.     04/06/23 Patient states that she is in pain flare. She has been in flare for awhile now    06/05/2023 Patient states that her knees are hurting today. Did not make it to the 10 weeks like she wanted. Tried to hold out as long as she could before coming in. Hurt her L foot a few years back (saw Claudene for it) and the same spot is hurting again. Does not know what she did to it this time to flare the pain back up.    07/10/23 Last visit patient was given another CSI injection and was informed we would run Zilretta  though insurance. Patient is approved for bilateral Zilretta  injections. Patient states knee pain is returned and would like bilateral injections.   08/24/2023 Patient states  10 weeks is now pushing it to far . Week 9 is when the pain started coming back    10/29/2023 Patient states she is ready for zilretta . Pain came back completely 2 weeks ago   12/22/2023 Patient states   Relevant Historical Information: Hypertension, DM type II, morbid obesity   Additional pertinent review of systems negative.   Current Outpatient Medications:  acetaminophen  (TYLENOL ) 500 MG tablet, Take 1,000 mg by mouth every 6 (six) hours as needed for headache., Disp: , Rfl:    atorvastatin  (LIPITOR) 40 MG tablet, TAKE 1 TABLET BY MOUTH ONCE DAILY AT  6  PM (Patient taking differently: Take 40 mg by mouth daily. TAKE 1 TABLET BY MOUTH ONCE DAILY AT  6  PM), Disp: 90 tablet, Rfl: 1   Cholecalciferol 1.25 MG (50000 UT) capsule, Take 50,000 Units by mouth once a week., Disp: , Rfl:    cyclobenzaprine  (FLEXERIL ) 5 MG  tablet, Take 5 mg by mouth 3 (three) times daily as needed for muscle spasms., Disp: , Rfl:    diclofenac  Sodium (VOLTAREN ) 1 % GEL, Apply 2 g topically 4 (four) times daily., Disp: 100 g, Rfl: 0   EPINEPHrine  (EPIPEN  JR) 0.15 MG/0.3ML injection, Inject 0.6 mLs (0.3 mg total) into the muscle once as needed for up to 1 dose for anaphylaxis., Disp: 2 each, Rfl: 0   EPINEPHrine  0.3 mg/0.3 mL IJ SOAJ injection, Inject 0.3 mg into the muscle as needed for anaphylaxis., Disp: 1 each, Rfl: 0   glipiZIDE  (GLUCOTROL  XL) 5 MG 24 hr tablet, Take 1 tablet by mouth daily., Disp: , Rfl:    hydrochlorothiazide  (HYDRODIURIL ) 25 MG tablet, Take 1 tablet (25 mg total) by mouth daily., Disp: 90 tablet, Rfl: 1   losartan  (COZAAR ) 50 MG tablet, Take 1 tablet (50 mg total) by mouth daily., Disp: 90 tablet, Rfl: 1   meloxicam  (MOBIC ) 15 MG tablet, Take 1 tablet (15 mg total) by mouth daily as needed for pain., Disp: 30 tablet, Rfl: 0   metFORMIN  (GLUCOPHAGE -XR) 500 MG 24 hr tablet, Take 1,000 mg by mouth 2 (two) times daily., Disp: , Rfl:    montelukast  (SINGULAIR ) 10 MG tablet, Take 1 tablet (10 mg total) by mouth at bedtime., Disp: 90 tablet, Rfl: 1   Multiple Vitamins-Minerals (MULTIVITAMIN WITH MINERALS) tablet, Take 1 tablet by mouth daily., Disp: , Rfl:    ondansetron  (ZOFRAN  ODT) 4 MG disintegrating tablet, Take 1 tablet (4 mg total) by mouth every 8 (eight) hours as needed for nausea or vomiting., Disp: 20 tablet, Rfl: 0   pantoprazole  (PROTONIX ) 40 MG tablet, Take 1 tablet (40 mg total) by mouth daily., Disp: 30 tablet, Rfl: 0   potassium chloride SA (KLOR-CON M) 20 MEQ tablet, Take 1 tablet by mouth daily., Disp: , Rfl:    SUMAtriptan  (IMITREX ) 50 MG tablet, Take 1 tablet (50 mg total) by mouth once as needed for up to 1 dose for migraine. May repeat in 2 hours if headache persists or recurs., Disp: 10 tablet, Rfl: 0   tiZANidine  (ZANAFLEX ) 4 MG tablet, Take 1 tablet (4 mg total) by mouth at bedtime., Disp: 30  tablet, Rfl: 0   Vitamin D , Ergocalciferol , (DRISDOL ) 1.25 MG (50000 UNIT) CAPS capsule, Take 1 capsule (50,000 Units total) by mouth every 7 (seven) days., Disp: 12 capsule, Rfl: 0   WEGOVY 0.25 MG/0.5ML SOAJ, Inject 0.25 mg into the skin once a week., Disp: , Rfl:        Electronically signed by:  Odis Mace D.CLEMENTEEN [facility] 3:17 PM 12/22/23

## 2024-04-05 ENCOUNTER — Ambulatory Visit: Admitting: Family Medicine
